# Patient Record
Sex: Male | Born: 1945 | Race: White | Hispanic: No | Marital: Married | Smoking: Current some day smoker
Health system: Southern US, Community
[De-identification: ages and names within clinical notes are randomized; demographics above are authoritative.]

## PROBLEM LIST (undated history)

## (undated) DIAGNOSIS — I10 Essential (primary) hypertension: Secondary | ICD-10-CM

## (undated) DIAGNOSIS — Z95810 Presence of automatic (implantable) cardiac defibrillator: Secondary | ICD-10-CM

## (undated) DIAGNOSIS — E119 Type 2 diabetes mellitus without complications: Secondary | ICD-10-CM

## (undated) DIAGNOSIS — I219 Acute myocardial infarction, unspecified: Secondary | ICD-10-CM

## (undated) HISTORY — PX: OTHER SURGICAL HISTORY: SHX169

## (undated) HISTORY — PX: CHOLECYSTECTOMY: SHX55

## (undated) HISTORY — PX: CARDIAC CATHETERIZATION: SHX172

## (undated) HISTORY — PX: CORONARY ANGIOPLASTY: SHX604

---

## 2003-01-21 DIAGNOSIS — Z944 Liver transplant status: Secondary | ICD-10-CM | POA: Insufficient documentation

## 2004-07-11 ENCOUNTER — Emergency Department: Payer: Self-pay | Admitting: Emergency Medicine

## 2005-08-18 ENCOUNTER — Emergency Department: Payer: Self-pay | Admitting: Emergency Medicine

## 2010-09-11 DIAGNOSIS — C44320 Squamous cell carcinoma of skin of unspecified parts of face: Secondary | ICD-10-CM | POA: Insufficient documentation

## 2010-12-27 DIAGNOSIS — I2510 Atherosclerotic heart disease of native coronary artery without angina pectoris: Secondary | ICD-10-CM | POA: Insufficient documentation

## 2010-12-27 DIAGNOSIS — E782 Mixed hyperlipidemia: Secondary | ICD-10-CM | POA: Insufficient documentation

## 2010-12-27 DIAGNOSIS — I10 Essential (primary) hypertension: Secondary | ICD-10-CM | POA: Insufficient documentation

## 2010-12-27 DIAGNOSIS — E119 Type 2 diabetes mellitus without complications: Secondary | ICD-10-CM | POA: Insufficient documentation

## 2021-03-23 ENCOUNTER — Other Ambulatory Visit: Payer: Self-pay

## 2021-03-23 ENCOUNTER — Emergency Department: Payer: Medicare Other

## 2021-03-23 ENCOUNTER — Observation Stay: Payer: Medicare Other

## 2021-03-23 ENCOUNTER — Observation Stay
Admission: EM | Admit: 2021-03-23 | Discharge: 2021-03-24 | Disposition: A | Discharge status: Home / Self Care | Payer: Medicare Other | Attending: Emergency Medicine | Admitting: Emergency Medicine

## 2021-03-23 ENCOUNTER — Encounter: Payer: Self-pay | Admitting: Intensive Care

## 2021-03-23 DIAGNOSIS — Z20822 Contact with and (suspected) exposure to covid-19: Secondary | ICD-10-CM | POA: Diagnosis not present

## 2021-03-23 DIAGNOSIS — Z944 Liver transplant status: Secondary | ICD-10-CM

## 2021-03-23 DIAGNOSIS — R4701 Aphasia: Secondary | ICD-10-CM

## 2021-03-23 DIAGNOSIS — I2510 Atherosclerotic heart disease of native coronary artery without angina pectoris: Secondary | ICD-10-CM | POA: Diagnosis present

## 2021-03-23 DIAGNOSIS — Z79899 Other long term (current) drug therapy: Secondary | ICD-10-CM | POA: Insufficient documentation

## 2021-03-23 DIAGNOSIS — G459 Transient cerebral ischemic attack, unspecified: Secondary | ICD-10-CM | POA: Diagnosis not present

## 2021-03-23 DIAGNOSIS — Z7982 Long term (current) use of aspirin: Secondary | ICD-10-CM | POA: Diagnosis not present

## 2021-03-23 DIAGNOSIS — Y900 Blood alcohol level of less than 20 mg/100 ml: Secondary | ICD-10-CM | POA: Diagnosis not present

## 2021-03-23 DIAGNOSIS — Z7984 Long term (current) use of oral hypoglycemic drugs: Secondary | ICD-10-CM | POA: Insufficient documentation

## 2021-03-23 DIAGNOSIS — F17290 Nicotine dependence, other tobacco product, uncomplicated: Secondary | ICD-10-CM | POA: Insufficient documentation

## 2021-03-23 DIAGNOSIS — E119 Type 2 diabetes mellitus without complications: Secondary | ICD-10-CM | POA: Diagnosis not present

## 2021-03-23 DIAGNOSIS — I10 Essential (primary) hypertension: Secondary | ICD-10-CM | POA: Diagnosis present

## 2021-03-23 DIAGNOSIS — E785 Hyperlipidemia, unspecified: Secondary | ICD-10-CM | POA: Diagnosis present

## 2021-03-23 HISTORY — DX: Type 2 diabetes mellitus without complications: E11.9

## 2021-03-23 HISTORY — DX: Essential (primary) hypertension: I10

## 2021-03-23 LAB — COMPREHENSIVE METABOLIC PANEL
ALT: 13 U/L (ref 0–44)
AST: 20 U/L (ref 15–41)
Albumin: 3.8 g/dL (ref 3.5–5.0)
Alkaline Phosphatase: 64 U/L (ref 38–126)
Anion gap: 7 (ref 5–15)
BUN: 17 mg/dL (ref 8–23)
CO2: 27 mmol/L (ref 22–32)
Calcium: 9.4 mg/dL (ref 8.9–10.3)
Chloride: 102 mmol/L (ref 98–111)
Creatinine, Ser: 1.1 mg/dL (ref 0.61–1.24)
GFR, Estimated: >60 mL/min (ref >60)
Glucose, Bld: 194 mg/dL — ABNORMAL HIGH (ref 70–99)
Potassium: 4.2 mmol/L (ref 3.5–5.1)
Sodium: 136 mmol/L (ref 135–145)
Total Bilirubin: 0.5 mg/dL (ref 0.3–1.2)
Total Protein: 7.8 g/dL (ref 6.5–8.1)

## 2021-03-23 LAB — PROTIME-INR
INR: 1 (ref 0.8–1.2)
Prothrombin Time: 12.6 seconds (ref 11.4–15.2)

## 2021-03-23 LAB — CBC
HCT: 41.2 % (ref 39.0–52.0)
Hemoglobin: 12.8 g/dL — ABNORMAL LOW (ref 13.0–17.0)
MCH: 23.8 pg — ABNORMAL LOW (ref 26.0–34.0)
MCHC: 31.1 g/dL (ref 30.0–36.0)
MCV: 76.7 fL — ABNORMAL LOW (ref 80.0–100.0)
Platelets: 273 10*3/uL (ref 150–400)
RBC: 5.37 MIL/uL (ref 4.22–5.81)
RDW: 19.9 % — ABNORMAL HIGH (ref 11.5–15.5)
WBC: 11.1 10*3/uL — ABNORMAL HIGH (ref 4.0–10.5)
nRBC: 0 % (ref 0.0–0.2)

## 2021-03-23 LAB — DIFFERENTIAL
Abs Immature Granulocytes: 0.04 10*3/uL (ref 0.00–0.07)
Basophils Absolute: 0.1 10*3/uL (ref 0.0–0.1)
Basophils Relative: 0 %
Eosinophils Absolute: 0.2 10*3/uL (ref 0.0–0.5)
Eosinophils Relative: 2 %
Immature Granulocytes: 0 %
Lymphocytes Relative: 20 %
Lymphs Abs: 2.3 10*3/uL (ref 0.7–4.0)
Monocytes Absolute: 0.7 10*3/uL (ref 0.1–1.0)
Monocytes Relative: 7 %
Neutro Abs: 7.8 10*3/uL — ABNORMAL HIGH (ref 1.7–7.7)
Neutrophils Relative %: 71 %

## 2021-03-23 LAB — URINE DRUG SCREEN, QUALITATIVE (ARMC ONLY)
Amphetamines, Ur Screen: NOT DETECTED
Barbiturates, Ur Screen: NOT DETECTED
Benzodiazepine, Ur Scrn: NOT DETECTED
Cannabinoid 50 Ng, Ur ~~LOC~~: NOT DETECTED
Cocaine Metabolite,Ur ~~LOC~~: NOT DETECTED
MDMA (Ecstasy)Ur Screen: NOT DETECTED
Methadone Scn, Ur: NOT DETECTED
Opiate, Ur Screen: NOT DETECTED
Phencyclidine (PCP) Ur S: NOT DETECTED
Tricyclic, Ur Screen: NOT DETECTED

## 2021-03-23 LAB — APTT: aPTT: 29 seconds (ref 24–36)

## 2021-03-23 LAB — ETHANOL: Alcohol, Ethyl (B): <10 mg/dL (ref <10)

## 2021-03-23 LAB — CBG MONITORING, ED: Glucose-Capillary: 203 mg/dL — ABNORMAL HIGH (ref 70–99)

## 2021-03-23 MED ORDER — CALCIUM 600+D3 PLUS MINERALS 600-800 MG-UNIT PO TABS: 1.0000 | ORAL_TABLET | Freq: Three times a day (TID) | ORAL | Status: DC

## 2021-03-23 MED ORDER — CLOPIDOGREL BISULFATE 75 MG PO TABS
75.0000 mg | ORAL_TABLET | Freq: Every day | ORAL | Status: DC
  Administered 2021-03-24: 75 mg via ORAL
  Filled 2021-03-23: qty 1

## 2021-03-23 MED ORDER — PRAVASTATIN SODIUM 20 MG PO TABS
40.0000 mg | ORAL_TABLET | Freq: Every day | ORAL | Status: DC
  Administered 2021-03-23: 22:00:00 40 mg via ORAL
  Filled 2021-03-23: qty 2

## 2021-03-23 MED ORDER — ASPIRIN EC 81 MG PO TBEC
81.0000 mg | DELAYED_RELEASE_TABLET | Freq: Every day | ORAL | Status: DC
  Administered 2021-03-23: 22:00:00 81 mg via ORAL
  Filled 2021-03-23: qty 1

## 2021-03-23 MED ORDER — LORAZEPAM 2 MG/ML IJ SOLN
0.2500 mg | Freq: Once | INTRAMUSCULAR | Status: AC
  Administered 2021-03-23: 21:00:00 0.25 mg via INTRAVENOUS
  Filled 2021-03-23: qty 1

## 2021-03-23 MED ORDER — PANTOPRAZOLE SODIUM 40 MG PO TBEC
40.0000 mg | DELAYED_RELEASE_TABLET | Freq: Every day | ORAL | Status: DC
  Administered 2021-03-23: 22:00:00 40 mg via ORAL
  Filled 2021-03-23: qty 1

## 2021-03-23 MED ORDER — INSULIN ASPART 100 UNIT/ML IJ SOLN
0.0000 [IU] | Freq: Three times a day (TID) | INTRAMUSCULAR | Status: DC
  Filled 2021-03-23: qty 1

## 2021-03-23 MED ORDER — ENOXAPARIN SODIUM 40 MG/0.4ML IJ SOSY
40.0000 mg | PREFILLED_SYRINGE | INTRAMUSCULAR | Status: DC
  Administered 2021-03-23: 22:00:00 40 mg via SUBCUTANEOUS
  Filled 2021-03-23: qty 0.4

## 2021-03-23 MED ORDER — IOHEXOL 350 MG/ML SOLN
75.0000 mL | Freq: Once | INTRAVENOUS | Status: AC | PRN
  Administered 2021-03-23: 19:00:00 75 mL via INTRAVENOUS

## 2021-03-23 MED ORDER — ACETAMINOPHEN 160 MG/5ML PO SOLN
650.0000 mg | ORAL | Status: DC | PRN
  Filled 2021-03-23: qty 20.3

## 2021-03-23 MED ORDER — SODIUM CHLORIDE 0.9 % IV SOLN: INTRAVENOUS | Status: DC

## 2021-03-23 MED ORDER — TACROLIMUS 0.5 MG PO CAPS
0.5000 mg | ORAL_CAPSULE | Freq: Two times a day (BID) | ORAL | Status: DC
  Administered 2021-03-23 – 2021-03-24 (×2): 0.5 mg via ORAL
  Filled 2021-03-23 (×4): qty 1

## 2021-03-23 MED ORDER — STROKE: EARLY STAGES OF RECOVERY BOOK: Freq: Once | Status: AC

## 2021-03-23 MED ORDER — HYDRALAZINE HCL 20 MG/ML IJ SOLN: 10.0000 mg | INTRAMUSCULAR | Status: DC | PRN

## 2021-03-23 MED ORDER — ACETAMINOPHEN 325 MG PO TABS: 650.0000 mg | ORAL_TABLET | ORAL | Status: DC | PRN

## 2021-03-23 MED ORDER — ACETAMINOPHEN 650 MG RE SUPP: 650.0000 mg | RECTAL | Status: DC | PRN

## 2021-03-23 MED ORDER — OYSTER SHELL CALCIUM/D3 500-5 MG-MCG PO TABS
1.0000 | ORAL_TABLET | Freq: Three times a day (TID) | ORAL | Status: DC
  Administered 2021-03-24: 1 via ORAL
  Filled 2021-03-23: qty 1

## 2021-03-23 NOTE — Consult Note (Signed)
CODE STROKE- PHARMACY COMMUNICATION   Time CODE STROKE called/page received:1816  Time response to CODE STROKE was made (via phone): 1825  Time Stroke Kit retrieved from [facility] (only if needed): N/A. 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 Pt's symptoms have completely resolved to baseline during neuro assessment. Will be admitted for further stroke work-up and medical mgmt, but not receiving tPA at this time.  Name of Provider/Nurse contacted: Contacted RN Allred, and Neuro MD was Dr. Rory Percy.  Past Medical History:  Diagnosis Date   Diabetes mellitus without complication ([place])    Hypertension    Prior to Admission medications   Not on File    Lorna Dibble ,PharmD Clinical Pharmacist  03/23/2021  6:24 PM

## 2021-03-23 NOTE — ED Provider Notes (Signed)
Emergency Medicine Provider Triage Evaluation Note  Dean King , a 75 y.o. male  was evaluated in triage.  Pt complains of loss of speech that started  around 5:45pm. Wife heard him trying to say something and she went to check on him and he couldn't say anything. Speech issue has resolved, however wife states he is still altered.   Review of Systems  Positive: Altered mental status Negative: Headache  Physical Exam  There were no vitals taken for this visit. Gen:   Awake, no distress   Resp:  Normal effort  MSK:   Moves extremities without difficulty  Other:    Medical Decision Making  Medically screening exam initiated at 6:10 PM.  Appropriate orders placed.  Randolm Idol was informed that the remainder of the evaluation will be completed by another provider, this initial triage assessment does not replace that evaluation, and the importance of remaining in the ED until their evaluation is complete.  Patient directly to CT then assigned to room 11.   Victorino Dike, FNP 03/23/21 1814    Harvest Dark, MD 03/24/21 [phone]

## 2021-03-23 NOTE — ED Notes (Signed)
Called [facility] initiated code stroke  1816

## 2021-03-23 NOTE — H&P (Signed)
History and Physical    Dean King IOE:[id number] DOB: 1945/05/28 DOA: 03/23/2021  PCP: Albina Billet, MD  Patient coming from: Home.  Chief Complaint: Difficulty speaking.  HPI: Dean King is a 75 y.o. male with history of liver transplant, CAD status post stenting, hypertension, diabetes mellitus, tobacco use, anemia was brought to the ER after patient started experience difficulty talking.  Patient states he was able to understand but was unable to talk about the words.  This happened around 5:45 PM on March 23, 2021 while watching television with his wife.  By time patient reached the ER patient's symptoms are largely resolved.  Denies any weakness of the upper or lower extremities.  Denies any visual symptoms.  ED Course: CT head shows possible subacute infarct along the right side of the brain.  EKG shows normal sinus rhythm.  On-call neurology was consulted.  Patient admitted for further work-up of TIA versus stroke.  CT angiogram of the head and neck shows 70% luminal's stenosis of the proximal left internal carotid artery.  Patient passed stroke swallow.  COVID test was negative.  Review of Systems: As per HPI, rest all negative.   Past Medical History:  Diagnosis Date   Diabetes mellitus without complication ([place])    Hypertension     Past Surgical History:  Procedure Laterality Date   CARDIAC CATHETERIZATION     CHOLECYSTECTOMY     CORONARY ANGIOPLASTY     liver transplannt       reports that he has been smoking cigars. His smokeless tobacco use includes chew. He reports that he does not drink alcohol and does not use drugs.  Allergies  Allergen Reactions   Levaquin [Levofloxacin]     Family History  Family history unknown: Yes    Prior to Admission medications   Medication Sig Start Date End Date Taking? Authorizing Provider  amLODipine (NORVASC) 10 MG tablet Take 10 mg by mouth every evening.   Yes [provider]  aspirin EC 81 MG tablet  Take 81 mg by mouth at bedtime.   Yes [provider]  Calcium Carbonate-Vit D-Min (CALCIUM 600+D3 PLUS MINERALS) 600-800 MG-UNIT TABS Take 1 tablet by mouth 3 (three) times daily.   Yes [provider]  clopidogrel (PLAVIX) 75 MG tablet Take 75 mg by mouth daily.   Yes [provider]  glipiZIDE (GLUCOTROL) 5 MG tablet Take 5 mg by mouth 2 (two) times daily before a meal.   Yes [provider]  magnesium oxide (MAG-OX) 400 MG tablet Take 400 mg by mouth 2 (two) times daily.   Yes [provider]  metFORMIN (GLUCOPHAGE) 500 MG tablet Take 1,000 mg by mouth 2 (two) times daily.   Yes [provider]  metoprolol tartrate (LOPRESSOR) 100 MG tablet Take 100 mg by mouth 2 (two) times daily.   Yes [provider]  omeprazole (PRILOSEC) 20 MG capsule Take 20 mg by mouth at bedtime.   Yes [provider]  pravastatin (PRAVACHOL) 40 MG tablet Take 40 mg by mouth at bedtime.   Yes [provider]  PROGRAF 0.5 MG capsule Take 0.5 mg by mouth 2 (two) times daily.   Yes [provider]    Physical Exam: Constitutional: Moderately built and nourished. Vitals:   03/23/21 1812 03/23/21 1815 03/23/21 1900  BP: (!) 144/61    Pulse: 87  88  Resp: 18  16  Temp: 98 F (36.7 C)    TempSrc: Oral  SpO2: 96%  99%  Weight:  78.9 kg   Height:  5\' 10"  (1.778 m)    Eyes: Anicteric no pallor. ENMT: No discharge from the ears eyes nose and mouth. Neck: No mass felt.  No neck rigidity. Respiratory: No rhonchi or crepitations. Cardiovascular: S1-S2 heard. Abdomen: Soft nontender bowel sound present. Musculoskeletal: No edema. Skin: No rash. Neurologic: Alert awake oriented to time place and person.  Moves all extremities 5 x 5.  No facial asymmetry tongue is midline pupils equal and reacting to light. Psychiatric: Appears normal.  Normal affect.   Labs on Admission: I have personally reviewed following labs and imaging  studies  CBC: Recent Labs  Lab 03/23/21 1819  WBC 11.1*  NEUTROABS 7.8*  HGB 12.8*  HCT 41.2  MCV 76.7*  PLT 330   Basic Metabolic Panel: Recent Labs  Lab 03/23/21 1819  NA 136  K 4.2  CL 102  CO2 27  GLUCOSE 194*  BUN 17  CREATININE 1.10  CALCIUM 9.4   GFR: Estimated Creatinine Clearance: 59.9 mL/min (by C-G formula based on SCr of 1.1 mg/dL). Liver Function Tests: Recent Labs  Lab 03/23/21 1819  AST 20  ALT 13  ALKPHOS 64  BILITOT 0.5  PROT 7.8  ALBUMIN 3.8   No results for input(s): LIPASE, AMYLASE in the last 168 hours. No results for input(s): AMMONIA in the last 168 hours. Coagulation Profile: Recent Labs  Lab 03/23/21 1819  INR 1.0   Cardiac Enzymes: No results for input(s): CKTOTAL, CKMB, CKMBINDEX, TROPONINI in the last 168 hours. BNP (last 3 results) No results for input(s): PROBNP in the last 8760 hours. HbA1C: No results for input(s): HGBA1C in the last 72 hours. CBG: Recent Labs  Lab 03/23/21 1815  GLUCAP 203*   Lipid Profile: No results for input(s): CHOL, HDL, LDLCALC, TRIG, CHOLHDL, LDLDIRECT in the last 72 hours. Thyroid Function Tests: No results for input(s): TSH, T4TOTAL, FREET4, T3FREE, THYROIDAB in the last 72 hours. Anemia Panel: No results for input(s): VITAMINB12, FOLATE, FERRITIN, TIBC, IRON, RETICCTPCT in the last 72 hours. Urine analysis: No results found for: COLORURINE, APPEARANCEUR, LABSPEC, PHURINE, GLUCOSEU, HGBUR, BILIRUBINUR, KETONESUR, PROTEINUR, UROBILINOGEN, NITRITE, LEUKOCYTESUR Sepsis Labs: @LABRCNTIP (procalcitonin:4,lacticidven:4) )No results found for this or any previous visit (from the past 240 hour(s)).   Radiological Exams on Admission: CT HEAD CODE STROKE WO CONTRAST  Addendum Date: 03/23/2021   ADDENDUM REPORT: 03/23/2021 18:51 ADDENDUM: The area infarction is in the right temporal and parietal lobe, not left. Findings were discussed with Dr. Rory Percy. Electronically Signed   By: San Morelle M.D.   On: 03/23/2021 18:51   Result Date: 03/23/2021 CLINICAL DATA:  Code stroke. Episode of abnormal speech approximately 45 minutes ago. Speech symptoms have resolved. Patient's wife states altered mental status persist. EXAM: CT HEAD WITHOUT CONTRAST TECHNIQUE: Contiguous axial images were obtained from the base of the skull through the vertex without intravenous contrast. COMPARISON:  None. FINDINGS: Brain: A left temporal and parietal lobe infarct extends over 6 cm involvement of the superior temporal gyrus and right occipital lobe. No acute hemorrhage is present. Sulci are effaced. Adjacent ventricle within normal limits without evidence for volume loss. Mild generalized white matter disease is present otherwise. Basal ganglia are otherwise intact. The insular ribbon is normal bilaterally. No significant extraaxial fluid collection is present. The craniocervical junction is normal. Upper cervical spine is within normal limits. Marrow signal is unremarkable. Vascular: Atherosclerotic calcifications are present within the cavernous internal carotid arteries. No hyperdense vessel  is present. Skull: Calvarium is intact. No focal lytic or blastic lesions are present. No significant extracranial soft tissue lesion is present. Sinuses/Orbits: Mild mucosal thickening is present within the inferior frontal sinuses bilaterally. The paranasal sinuses and mastoid air cells are otherwise clear. Bilateral lens replacements are noted. Globes and orbits are otherwise unremarkable. Other: ASPECTS ([place] Stroke Program Early CT Score) - Ganglionic level infarction (caudate, lentiform nuclei, internal capsule, insula, M1-M3 cortex): 6/7 - Supraganglionic infarction (M4-M6 cortex): 2/3 Total score (0-10 with 10 being normal): 8/10 IMPRESSION: 1. 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 Infarct appears acute/subacute, likely greater  than 1 hour old. Recommend CTA head and neck with contrast and MR head without contrast for further evaluation. 2. No acute hemorrhage. 3. ASPECTS is 8/10. These results were called by telephone at the time of interpretation on 03/23/2021 at 6:32 pm to provider [facility] , who verbally acknowledged these results. Electronically Signed: By: San Morelle M.D. On: 03/23/2021 18:32   CT ANGIO HEAD NECK W WO CM (CODE STROKE)  Result Date: 03/23/2021 CLINICAL DATA:  Word finding difficulty EXAM: CT ANGIOGRAPHY HEAD AND NECK TECHNIQUE: Multidetector CT imaging of the head and neck was performed using the standard protocol during bolus administration of intravenous contrast. Multiplanar CT image reconstructions and MIPs were obtained to evaluate the vascular anatomy. Carotid stenosis measurements (when applicable) are obtained utilizing NASCET criteria, using the distal internal carotid diameter as the denominator. CONTRAST:  6mL OMNIPAQUE IOHEXOL 350 MG/ML SOLN COMPARISON:  No prior CTA. Correlation is made with 03/23/2021 CT head. FINDINGS: CT HEAD FINDINGS For noncontrast findings, please see same day CT head. CTA NECK FINDINGS Aortic arch: Standard branching. Imaged portion shows no evidence of aneurysm or dissection. No significant stenosis of the major arch vessel origins, although calcified and noncalcified plaque does extend into the origins. Right carotid system: Calcified and noncalcified plaque at the bifurcation, which causes less than 50% luminal narrowing. No evidence of dissection, stenosis (50% or greater) or occlusion. Left carotid system: Calcified and noncalcified plaque at the bifurcation and in the proximal left ICA causes 70% luminal narrowing in the proximal left ICA. In addition, there is approximately 30% narrowing of the proximal left CCA. No evidence of dissection or occlusion. Vertebral arteries: Right dominant. No evidence of dissection, stenosis (50% or greater) or occlusion.  Skeleton: No acute osseous abnormality. Other neck: Prominent left parotid (series 8, image 162) and left level 2A (series 8, image 209) lymph nodes, which each measure up to 6 mm in short axis. These are not of abnormal density or morphology and are favored to be reactive. Upper chest: Negative. Review of the MIP images confirms the above findings CTA HEAD FINDINGS Anterior circulation: Calcified and noncalcified plaque in the intracranial internal carotid arteries, left-greater-than-right, which causes moderate luminal narrowing in the left cavernous segment and moderate narrowing in the right supraclinoid segment. Patent right A1. Hypoplastic left A1. Normal anterior communicating artery. Anterior cerebral arteries are patent to their distal aspects. No M1 stenosis or occlusion. Normal MCA bifurcations. Distal MCA branches perfused and symmetric. Posterior circulation: Vertebral arteries patent to the vertebrobasilar junction with minimal calcification but without significant stenosis. Right dominant. Posterior inferior cerebral arteries patent bilaterally. Basilar patent to its distal aspect. Superior cerebellar arteries patent bilaterally. Bilateral P1 segments originate from the basilar artery. Duplicated left PCA with fetal origin from a patent left posterior communicating artery. PCAs perfused to  their distal aspects without stenosis. Venous sinuses: Patent. Anatomic variants: Duplicated left PCA, with additional fetal origin PCA Review of the MIP images confirms the above findings IMPRESSION: 1. 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 No other significant intracranial stenosis. 2. 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 In addition there is approximately 30% narrowing of the proximal left CCA. No other hemodynamically significant stenosis in the neck. Electronically Signed   By: Merilyn Baba M.D.   On: 03/23/2021 19:26    EKG: Independently reviewed.  Normal sinus  rhythm.  Assessment/Plan Principal Problem:   TIA (transient ischemic attack) Active Problems:   CAD (coronary artery disease)   Essential hypertension   HLD (hyperlipidemia)   Liver transplant recipient [facility])   Diabetes mellitus type 2 in nonobese [facility])    TIA versus stroke -appreciate neurology consult.  CT angiogram of the head and neck does show 70% luminal needs narrowing of the proximal left ICA.  MRI brain is pending.  We will follow further recommendations per neurology.  Patient is presently on antiplatelet agents statins.  Neurochecks.  Hemoglobin A1c lipid panel and 2D echo.  Physical therapy consult. Hypertension -as needed IV hydralazine for systolic more than 626 and diastolic more than 948.  Allow for permissive hypertension as requested by neurologist. Diabetes mellitus type 2 we will keep patient on sliding scale coverage.  Hemoglobin A1c is pending. 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 Anemia appears to be chronic follow CBC check anemia panel with next blood draw. CAD status post tenting denies any chest pain.   DVT prophylaxis: Lovenox. Code Status: Full code. Family Communication: Discussed with patient's wife. Disposition Plan: Home. Consults called: Neurology. Admission status: Observation.   Rise Patience MD [facility] Pager [phone].  If 7PM-7AM, please contact night-coverage [email or web address] Password [email or web address]  03/23/2021, 7:40 PM

## 2021-03-23 NOTE — Consult Note (Addendum)
[facility] Telemedicine Consult   Requesting Provider: Dr. Charlsie Quest Consult Participants: Dr. Jerelyn Charles, Telespecialist RN Verdis Frederickson   bedside RN Claiborne Billings  Location of the provider: Home Location of the patient: [facility] CT scanner in bed 11  This consult was provided via telemedicine with 2-way video and audio communication. The patient/family was informed that care would be provided in this way and agreed to receive care in this manner.    Chief Complaint: Word finding difficulty and dysarthria  HPI: 75 year old with past medical history of diabetes, CAD, liver transplant in 2004, hypertension, functional status good with to 3 miles walk daily and playing golf, presenting to the emergency room with sudden onset of what the right described as initially confusion but also word finding difficulty and dysarthria. Around 5:45 PM while watching a game, all of a sudden the wife noticed that he was not responding appropriately to her questions.  He said he remembers that his wife kept asking him things and he could not bring his words out.  His wife asked him to say her name and he knew that her name was Bes  but could not say it.  He himself brought to the hospital and appeared confused to the wife but then on the way her symptoms started to improve some, he started to become more verbal and started to fuss while driving which she usually does and he was evaluated in the emergency room.  And triage, the wife still reported persistent symptoms for which a code stroke was activated.  Upon the time that I evaluated him, NIH is 0. CT head shows a moderate to large area of encephalomalacia in the right MCA territory vs late subacute infarct.   Patient denies any prior history of stroke or trauma to the head.  Robust review of systems performed-ROS essentially negative-neg for preceding illness or sickness.  No strokelike symptoms recently or remotely.  Wife also denies any history of stroke or head trauma.   No chest pain shortness of breath.  No nausea vomiting.  No fever chills.   Past Medical History:  Diagnosis Date   Diabetes mellitus without complication ([place])    Hypertension   Coronary artery disease Liver transplant 2004 Multiple skin cancers  No current facility-administered medications for this encounter. No current outpatient medications on file.  LKW: 9211 tpa given?: No, moderate to large right hemispheric hypodensity and NIHSS 0 IR Thrombectomy? No, no E LVO, NIH 0 Modified Rankin Scale: 0-Completely asymptomatic and back to baseline post- stroke Time of teleneurologist evaluation: 1820  Exam: Vitals:   03/23/21 1812  BP: (!) 144/61  Pulse: 87  Resp: 18  Temp: 98 F (36.7 C)  SpO2: 96%    General: Awake alert in no distress Atypical normocephalic/atraumatic CVS: Regular rate rhythm on the monitor Neurological exam Awake alert oriented x3 Was able to give me a detailed history of what happened. No aphasia No dysarthria Was able to tell me the current president and 3 presidents prior as well as the president but was from [place] chart and [place]. Cranial nerves II to XII: Pupils equal round reactive to light, extraocular movements intact, visual field testing difficult but does not look to have field deficits, facial sensation intact, face symmetric. Motor examination with no drift in any of the 4 extremities. Sensation intact light touch without extinction Coordination with no dysmetria NIH- Stroke scale-0  Imaging Reviewed:  CTH with right MCA territory subacute to chronic appearing infarct. No bleed. No evolving  stroke on the left hemisphere. CTA head neck with left carotid stenosis at the bifurcation  No ELVO   Labs reviewed in epic and pertinent values follow: CBC    Component Value Date/Time   WBC 11.1 (H) 03/23/2021 1819   RBC 5.37 03/23/2021 1819   HGB 12.8 (L) 03/23/2021 1819   HCT 41.2 03/23/2021 1819   PLT 273 03/23/2021 1819    MCV 76.7 (L) 03/23/2021 1819   MCH 23.8 (L) 03/23/2021 1819   MCHC 31.1 03/23/2021 1819   RDW 19.9 (H) 03/23/2021 1819   LYMPHSABS 2.3 03/23/2021 1819   MONOABS 0.7 03/23/2021 1819   EOSABS 0.2 03/23/2021 1819   BASOSABS 0.1 03/23/2021 1819   CMP     Component Value Date/Time   NA 136 03/23/2021 1819   K 4.2 03/23/2021 1819   CL 102 03/23/2021 1819   CO2 27 03/23/2021 1819   GLUCOSE 194 (H) 03/23/2021 1819   BUN 17 03/23/2021 1819   CREATININE 1.10 03/23/2021 1819   CALCIUM 9.4 03/23/2021 1819   PROT 7.8 03/23/2021 1819   ALBUMIN 3.8 03/23/2021 1819   AST 20 03/23/2021 1819   ALT 13 03/23/2021 1819   ALKPHOS 64 03/23/2021 1819   BILITOT 0.5 03/23/2021 1819   GFRNONAA >60 03/23/2021 1819     Assessment: 75 year old with dental 15 minutes worth of word finding difficulty brought in for emergent evaluation for stroke.  On my examination, symptoms completely resolved.  NIH stroke scale 0. CT head with a right parietal temporal occipital area of what looks like to me late subacute to chronic infarct but could be late acute to early subacute infarct as well. Symptoms do not match the imaging findings. 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  The right MCA stroke might be subacute to chronic as well. Given his complex medical history including that of transplant, coronary artery disease, diabetes and hypertension- I would recommend admission for stroke work-up.  Not a candidate for IV TNKase due to NIH of 0 Not a candidate for thrombectomy due to NIH 0, no emergent LVO.   Recommendations:  Admit to hospitalist Frequent neurochecks Telemetry Aspirin 325 Atorvastatin 80 MRI brain without contrast 2d echo A1c Lipid panel PT OT Speech therapy Allow for permissive hypertension-treat only if systolic blood pressures are greater than 220 on a as needed basis.  48 to 72 hours later,  start normalizing blood pressure with a eventual blood pressure goal of normotension.  Discussed the plan in detail with the patient and ED provider Dr. Su Hoff at bedside   This patient is receiving care for possible acute neurological changes. There was 56 minutes of care by this provider at the time of service, including time for direct evaluation via telemedicine, review of medical records, imaging studies and discussion of findings with providers, the patient and/or family.  -- Amie Portland, MD [facility] Pager: [phone] If 7pm to 7am, please call on call as listed on AMION.

## 2021-03-23 NOTE — ED Notes (Signed)
Wife stating they were watching a basketball game about 5:45pm when she looked over and her husband wasn't responding. Wife states pt had episode of aphasia.

## 2021-03-23 NOTE — ED Triage Notes (Addendum)
Wife reports patient presented with aphasia for 10 minutes then resolved. Wife reports "he just seemed altered for awhile after and having a hard time finding his words." Symptoms started around 5:45pm.

## 2021-03-23 NOTE — ED Provider Notes (Signed)
[facility] Emergency Department Provider Note ____________________________________________   Event Date/Time   First MD Initiated Contact with Patient 03/23/21 1817     (approximate)  I have reviewed the triage vital signs and the nursing notes.  HISTORY  Chief Complaint Aphasia and Altered Mental Status   HPI Dean King is a 75 y.o. malewho presents to the ED for evaluation of aphasia.  Chart review indicates DM and HTN.  CAD and skin cancers. 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  Wife provides me with a good history while patient is in CT scan.  She was with the patient this afternoon and at about 545p while they were watching a basketball game, patient had sudden onset aphasia and dysarthria.  She reports that he is quite talkative at baseline and she knew something was wrong by the noises he was making.  He could not get the words out and was stumbling over his speech and dysarthric.  He could not say his wife's name or his own name, so she quickly drove him to the hospital.  She reports that by the time they were nearly to the hospital, his speech is improving as he was telling her to slow down.  No recent falls, illnesses or injuries.  This has never happened before.  Past Medical History:  Diagnosis Date   Diabetes mellitus without complication ([place])    Hypertension     Patient Active Problem List   Diagnosis Date Noted   TIA (transient ischemic attack) 03/23/2021   CAD (coronary artery disease) 03/23/2021   Essential hypertension 03/23/2021   HLD (hyperlipidemia) 03/23/2021   Liver transplant recipient [facility]) 03/23/2021   Diabetes mellitus type 2 in nonobese ([place]) 03/23/2021    Past Surgical History:  Procedure Laterality Date   CARDIAC CATHETERIZATION     CHOLECYSTECTOMY     CORONARY ANGIOPLASTY     liver transplannt      Prior to Admission medications   Medication Sig Start  Date End Date Taking? Authorizing Provider  amLODipine (NORVASC) 10 MG tablet Take 10 mg by mouth every evening.   Yes [provider]  aspirin EC 81 MG tablet Take 81 mg by mouth at bedtime.   Yes [provider]  Calcium Carbonate-Vit D-Min (CALCIUM 600+D3 PLUS MINERALS) 600-800 MG-UNIT TABS Take 1 tablet by mouth 3 (three) times daily.   Yes [provider]  clopidogrel (PLAVIX) 75 MG tablet Take 75 mg by mouth daily.   Yes [provider]  glipiZIDE (GLUCOTROL) 5 MG tablet Take 5 mg by mouth 2 (two) times daily before a meal.   Yes [provider]  magnesium oxide (MAG-OX) 400 MG tablet Take 400 mg by mouth 2 (two) times daily.   Yes [provider]  metFORMIN (GLUCOPHAGE) 500 MG tablet Take 1,000 mg by mouth 2 (two) times daily.   Yes [provider]  metoprolol tartrate (LOPRESSOR) 100 MG tablet Take 100 mg by mouth 2 (two) times daily.   Yes [provider]  omeprazole (PRILOSEC) 20 MG capsule Take 20 mg by mouth at bedtime.   Yes [provider]  pravastatin (PRAVACHOL) 40 MG tablet Take 40 mg by mouth at bedtime.   Yes [provider]  PROGRAF 0.5 MG capsule Take 0.5 mg by mouth 2 (two) times daily.   Yes [provider]    Allergies Levaquin [levofloxacin]  Family History  Family history unknown: Yes  Social History Social History   Tobacco Use   Smoking status: Some Days    Types: Cigars   Smokeless tobacco: Current    Types: Chew  Substance Use Topics   Alcohol use: Never   Drug use: Never    Review of Systems  Unable to be accurately obtained from the patient due to acuity of condition ____________________________________________   PHYSICAL EXAM:  VITAL SIGNS: Vitals:   03/23/21 1900 03/23/21 1930  BP:  (!) 114/48  Pulse: 88 79  Resp: 16 12  Temp:    SpO2: 99% 98%     Constitutional: Alert and oriented. Well appearing and in no acute distress. Eyes:  Conjunctivae are normal. PERRL. EOMI. Head: Atraumatic. Nose: No congestion/rhinnorhea. Mouth/Throat: Mucous membranes are moist.  Oropharynx non-erythematous. Neck: No stridor. No cervical spine tenderness to palpation. Cardiovascular: Normal rate, regular rhythm. Good peripheral circulation. Respiratory: Normal respiratory effort.  No retractions. Lungs CTAB. Gastrointestinal: Soft , nondistended, nontender to palpation.  Musculoskeletal: No joint effusions. No signs of acute trauma. Neurologic:  Normal speech and language. No gross focal neurologic deficits are appreciated. No gait instability noted. Skin:  Skin is warm, dry and intact. No rash noted. Psychiatric: Mood and affect are normal. Speech and behavior are normal. ____________________________________________   LABS (all labs ordered are listed, but only abnormal results are displayed)  Labs Reviewed  CBC - Abnormal; Notable for the following components:      Result Value   WBC 11.1 (*)    Hemoglobin 12.8 (*)    MCV 76.7 (*)    MCH 23.8 (*)    RDW 19.9 (*)    All other components within normal limits  DIFFERENTIAL - Abnormal; Notable for the following components:   Neutro Abs 7.8 (*)    All other components within normal limits  COMPREHENSIVE METABOLIC PANEL - Abnormal; Notable for the following components:   Glucose, Bld 194 (*)    All other components within normal limits  CBG MONITORING, ED - Abnormal; Notable for the following components:   Glucose-Capillary 203 (*)    All other components within normal limits  RESP PANEL BY RT-PCR (FLU A&B, COVID) ARPGX2  ETHANOL  PROTIME-INR  APTT  URINE DRUG SCREEN, QUALITATIVE ([facility] ONLY)  HEMOGLOBIN A1C  LIPID PANEL   ____________________________________________  12 Lead EKG   ____________________________________________  RADIOLOGY  ED MD interpretation:   CT head reviewed by me with large right temporoparietal infarction  Official radiology report(s): CT  HEAD CODE STROKE WO CONTRAST  Addendum Date: 03/23/2021   ADDENDUM REPORT: 03/23/2021 18:51 ADDENDUM: The area infarction is in the right temporal and parietal lobe, not left. Findings were discussed with Dr. Rory Percy. Electronically Signed   By: San Morelle M.D.   On: 03/23/2021 18:51   Result Date: 03/23/2021 CLINICAL DATA:  Code stroke. Episode of abnormal speech approximately 45 minutes ago. Speech symptoms have resolved. Patient's wife states altered mental status persist. EXAM: CT HEAD WITHOUT CONTRAST TECHNIQUE: Contiguous axial images were obtained from the base of the skull through the vertex without intravenous contrast. COMPARISON:  None. FINDINGS: Brain: A left temporal and parietal lobe infarct extends over 6 cm involvement of the superior temporal gyrus and right occipital lobe. No acute hemorrhage is present. Sulci are effaced. Adjacent ventricle within normal limits without evidence for volume loss. Mild generalized white matter disease is present otherwise. Basal ganglia are otherwise intact. The insular ribbon is normal bilaterally. No significant extraaxial fluid collection is present. The craniocervical junction is  normal. Upper cervical spine is within normal limits. Marrow signal is unremarkable. Vascular: Atherosclerotic calcifications are present within the cavernous internal carotid arteries. No hyperdense vessel is present. Skull: Calvarium is intact. No focal lytic or blastic lesions are present. No significant extracranial soft tissue lesion is present. Sinuses/Orbits: Mild mucosal thickening is present within the inferior frontal sinuses bilaterally. The paranasal sinuses and mastoid air cells are otherwise clear. Bilateral lens replacements are noted. Globes and orbits are otherwise unremarkable. Other: ASPECTS ([place] Stroke Program Early CT Score) - Ganglionic level infarction (caudate, lentiform nuclei, internal capsule, insula, M1-M3 cortex): 6/7 - Supraganglionic  infarction (M4-M6 cortex): 2/3 Total score (0-10 with 10 being normal): 8/10 IMPRESSION: 1. 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 Infarct appears acute/subacute, likely greater than 1 hour old. Recommend CTA head and neck with contrast and MR head without contrast for further evaluation. 2. No acute hemorrhage. 3. ASPECTS is 8/10. These results were called by telephone at the time of interpretation on 03/23/2021 at 6:32 pm to provider [facility] , who verbally acknowledged these results. Electronically Signed: By: San Morelle M.D. On: 03/23/2021 18:32   CT ANGIO HEAD NECK W WO CM (CODE STROKE)  Result Date: 03/23/2021 CLINICAL DATA:  Word finding difficulty EXAM: CT ANGIOGRAPHY HEAD AND NECK TECHNIQUE: Multidetector CT imaging of the head and neck was performed using the standard protocol during bolus administration of intravenous contrast. Multiplanar CT image reconstructions and MIPs were obtained to evaluate the vascular anatomy. Carotid stenosis measurements (when applicable) are obtained utilizing NASCET criteria, using the distal internal carotid diameter as the denominator. CONTRAST:  47mL OMNIPAQUE IOHEXOL 350 MG/ML SOLN COMPARISON:  No prior CTA. Correlation is made with 03/23/2021 CT head. FINDINGS: CT HEAD FINDINGS For noncontrast findings, please see same day CT head. CTA NECK FINDINGS Aortic arch: Standard branching. Imaged portion shows no evidence of aneurysm or dissection. No significant stenosis of the major arch vessel origins, although calcified and noncalcified plaque does extend into the origins. Right carotid system: Calcified and noncalcified plaque at the bifurcation, which causes less than 50% luminal narrowing. No evidence of dissection, stenosis (50% or greater) or occlusion. Left carotid system: Calcified and noncalcified plaque at the bifurcation and in the proximal left ICA causes 70%  luminal narrowing in the proximal left ICA. In addition, there is approximately 30% narrowing of the proximal left CCA. No evidence of dissection or occlusion. Vertebral arteries: Right dominant. No evidence of dissection, stenosis (50% or greater) or occlusion. Skeleton: No acute osseous abnormality. Other neck: Prominent left parotid (series 8, image 162) and left level 2A (series 8, image 209) lymph nodes, which each measure up to 6 mm in short axis. These are not of abnormal density or morphology and are favored to be reactive. Upper chest: Negative. Review of the MIP images confirms the above findings CTA HEAD FINDINGS Anterior circulation: Calcified and noncalcified plaque in the intracranial internal carotid arteries, left-greater-than-right, which causes moderate luminal narrowing in the left cavernous segment and moderate narrowing in the right supraclinoid segment. Patent right A1. Hypoplastic left A1. Normal anterior communicating artery. Anterior cerebral arteries are patent to their distal aspects. No M1 stenosis or occlusion. Normal MCA bifurcations. Distal MCA branches perfused and symmetric. Posterior circulation: Vertebral arteries patent to the vertebrobasilar junction with minimal calcification but without significant stenosis. Right dominant. Posterior inferior cerebral arteries patent bilaterally. Basilar patent to its distal aspect. Superior cerebellar arteries  patent bilaterally. Bilateral P1 segments originate from the basilar artery. Duplicated left PCA with fetal origin from a patent left posterior communicating artery. PCAs perfused to their distal aspects without stenosis. Venous sinuses: Patent. Anatomic variants: Duplicated left PCA, with additional fetal origin PCA Review of the MIP images confirms the above findings IMPRESSION: 1. 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 No other significant intracranial stenosis. 2. 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 In  addition there is approximately 30% narrowing of the proximal left CCA. No other hemodynamically significant stenosis in the neck. Electronically Signed   By: Merilyn Baba M.D.   On: 03/23/2021 19:26    ____________________________________________   PROCEDURES and INTERVENTIONS  Procedure(s) performed (including Critical Care):  .1-3 Lead EKG Interpretation Performed by: Vladimir Crofts, MD Authorized by: Vladimir Crofts, MD     Interpretation: normal     ECG rate:  80   ECG rate assessment: normal     Rhythm: sinus rhythm     Ectopy: none     Conduction: normal   .Critical Care Performed by: Vladimir Crofts, MD Authorized by: Vladimir Crofts, MD   Critical care provider statement:    Critical care time (minutes):  30   Critical care time was exclusive of:  Separately billable procedures and treating other patients   Critical care was necessary to treat or prevent imminent or life-threatening deterioration of the following conditions:  CNS failure or compromise   Critical care was time spent personally by me on the following activities:  Development of treatment plan with patient or surrogate, discussions with consultants, evaluation of patient's response to treatment, examination of patient, ordering and review of laboratory studies, ordering and review of radiographic studies, ordering and performing treatments and interventions, pulse oximetry, re-evaluation of patient's condition and review of old charts  Medications  aspirin EC tablet 81 mg (has no administration in time range)  pravastatin (PRAVACHOL) tablet 40 mg (has no administration in time range)  pantoprazole (PROTONIX) EC tablet 40 mg (has no administration in time range)  clopidogrel (PLAVIX) tablet 75 mg (has no administration in time range)  tacrolimus (PROGRAF) capsule 0.5 mg (has no administration in time range)  0.9 %  sodium chloride infusion ( Intravenous New Bag/Given 03/23/21 2059)  acetaminophen (TYLENOL) tablet 650  mg (has no administration in time range)    Or  acetaminophen (TYLENOL) 160 MG/5ML solution 650 mg (has no administration in time range)    Or  acetaminophen (TYLENOL) suppository 650 mg (has no administration in time range)  enoxaparin (LOVENOX) injection 40 mg (has no administration in time range)  insulin aspart (novoLOG) injection 0-9 Units (has no administration in time range)  hydrALAZINE (APRESOLINE) injection 10 mg (has no administration in time range)  calcium-vitamin D (OSCAL WITH D) 500-5 MG-MCG per tablet 1 tablet (has no administration in time range)  iohexol (OMNIPAQUE) 350 MG/ML injection 75 mL (75 mLs Intravenous Contrast Given 03/23/21 1843)   stroke: mapping our early stages of recovery book ( Does not apply Given 03/23/21 1944)  LORazepam (ATIVAN) injection 0.25 mg (0.25 mg Intravenous Given 03/23/21 2059)    ____________________________________________   MDM / ED COURSE   75 year old male presents to the ED with acute aphasia and dysarthria, resolving quickly upon arrival, likely representing a TIA and requiring medical observation admission for this.  By the time I evaluate the patient, he is neurologically intact, but they provide a great story for what sounds like a TIA with acute and transient  dysarthria and aphasia.  No signs of ICH on CT head, but has a rather large infarct that is likely older than 1 hour and seems more established.  Blood work is unremarkable.  No signs of hypoglycemia or toxidromes.  We will admit to medicine for stroke work-up.  Clinical Course as of 03/23/21 2145  Fri Mar 23, 2021  1836 Discussed with wife while patient is in [place]. [DS]  1909 Discussed with Dr. Rory Percy.  Back to baseline and admit for TIA work-up [DS]    Clinical Course User Index [DS] Vladimir Crofts, MD    ____________________________________________   FINAL CLINICAL IMPRESSION(S) / ED DIAGNOSES  Final diagnoses:  TIA (transient ischemic attack)  Aphasia     ED  Discharge Orders     None        Samaj Wessells Tamala Julian   Note:  This document was prepared using Dragon voice recognition software and may include unintentional dictation errors.    Vladimir Crofts, MD 03/23/21 [phone]

## 2021-03-24 ENCOUNTER — Observation Stay: Admit: 2021-03-24 | Payer: Medicare Other

## 2021-03-24 ENCOUNTER — Observation Stay (HOSPITAL_BASED_OUTPATIENT_CLINIC_OR_DEPARTMENT_OTHER)
Admit: 2021-03-24 | Discharge: 2021-03-24 | Disposition: A | Discharge status: Home / Self Care | Payer: Medicare Other | Attending: Internal Medicine | Admitting: Internal Medicine

## 2021-03-24 DIAGNOSIS — I2510 Atherosclerotic heart disease of native coronary artery without angina pectoris: Secondary | ICD-10-CM | POA: Diagnosis not present

## 2021-03-24 DIAGNOSIS — I10 Essential (primary) hypertension: Secondary | ICD-10-CM | POA: Diagnosis not present

## 2021-03-24 DIAGNOSIS — I429 Cardiomyopathy, unspecified: Secondary | ICD-10-CM

## 2021-03-24 DIAGNOSIS — I639 Cerebral infarction, unspecified: Secondary | ICD-10-CM | POA: Diagnosis not present

## 2021-03-24 DIAGNOSIS — G459 Transient cerebral ischemic attack, unspecified: Secondary | ICD-10-CM | POA: Diagnosis not present

## 2021-03-24 LAB — LIPID PANEL
Cholesterol: 146 mg/dL (ref 0–200)
HDL: 33 mg/dL — ABNORMAL LOW (ref >40)
LDL Cholesterol: 82 mg/dL (ref 0–99)
Total CHOL/HDL Ratio: 4.4 RATIO
Triglycerides: 157 mg/dL — ABNORMAL HIGH (ref <150)
VLDL: 31 mg/dL (ref 0–40)

## 2021-03-24 LAB — RESP PANEL BY RT-PCR (FLU A&B, COVID) ARPGX2
Influenza A by PCR: NEGATIVE
Influenza B by PCR: NEGATIVE
SARS Coronavirus 2 by RT PCR: NEGATIVE

## 2021-03-24 LAB — ECHOCARDIOGRAM COMPLETE
AR max vel: 1.42 cm2
AV Peak grad: 7.8 mmHg
Ao pk vel: 1.4 m/s
Area-P 1/2: 3.65 cm2
Calc EF: 26.5 %
Height: 70 in
S' Lateral: 4.3 cm
Single Plane A2C EF: 22.3 %
Single Plane A4C EF: 32.1 %
Weight: 2784 oz

## 2021-03-24 LAB — CBG MONITORING, ED
Glucose-Capillary: 155 mg/dL — ABNORMAL HIGH (ref 70–99)
Glucose-Capillary: 198 mg/dL — ABNORMAL HIGH (ref 70–99)

## 2021-03-24 LAB — HEMOGLOBIN A1C
Hgb A1c MFr Bld: 7.2 % — ABNORMAL HIGH (ref 4.8–5.6)
Mean Plasma Glucose: 159.94 mg/dL

## 2021-03-24 MED ORDER — METOPROLOL SUCCINATE ER 25 MG PO TB24: 25.0000 mg | ORAL_TABLET | Freq: Every day | ORAL | 1 refills | Status: DC

## 2021-03-24 MED ORDER — PERFLUTREN LIPID MICROSPHERE
1.0000 mL | INTRAVENOUS | Status: AC | PRN
Start: 2021-03-24 — End: 2021-03-24
  Administered 2021-03-24: 3 mL via INTRAVENOUS
  Filled 2021-03-24: qty 10

## 2021-03-24 MED ORDER — LOSARTAN POTASSIUM 25 MG PO TABS: 25.0000 mg | ORAL_TABLET | Freq: Every day | ORAL | 1 refills | Status: DC

## 2021-03-24 MED ORDER — ATORVASTATIN CALCIUM 20 MG PO TABS
40.0000 mg | ORAL_TABLET | Freq: Every day | ORAL | Status: DC
  Administered 2021-03-24: 40 mg via ORAL
  Filled 2021-03-24: qty 2

## 2021-03-24 MED ORDER — ATORVASTATIN CALCIUM 40 MG PO TABS
40.0000 mg | ORAL_TABLET | Freq: Every day | ORAL | 2 refills | Status: AC
Start: 2021-03-25 — End: ?

## 2021-03-24 NOTE — Consult Note (Signed)
[facility]  Patient name: Dean King MRN: [id number] DOB: [birth year] Sex: male   REQUESTING PROVIDER:    ER   REASON FOR CONSULT:    Left brain stroke and left carotid stenosis  HISTORY OF PRESENT ILLNESS:   Dean King is a 75 y.o. male, who presented to the emergency department on 03/23/2021 with difficulty with word finding and dysarthria.  His symptoms have improved, and essentially resolved.  He has not had any prior episodes.  He did not receive tPA.  His work-up revealed a 70% left carotid stenosis and left brain stroke.  The patient is a diabetic.  He is on a statin for hypercholesterolemia.  He is medically managed for hypertension.  He has undergone liver transplantation over a decade ago.  He also has a history of coronary artery disease and is on dual antiplatelet therapy having had PCI.  PAST MEDICAL HISTORY    Past Medical History:  Diagnosis Date   Diabetes mellitus without complication ([place])    Hypertension      FAMILY HISTORY   Family History  Family history unknown: Yes    SOCIAL HISTORY:   Social History   Socioeconomic History   Marital status: Married    Spouse name: Not on file   Number of children: Not on file   Years of education: Not on file   Highest education level: Not on file  Occupational History   Not on file  Tobacco Use   Smoking status: Some Days    Types: Cigars   Smokeless tobacco: Current    Types: Chew  Substance and Sexual Activity   Alcohol use: Never   Drug use: Never   Sexual activity: Not on file  Other Topics Concern   Not on file  Social History Narrative   Not on file   Social Determinants of Health   Financial Resource Strain: Not on file  Food Insecurity: Not on file  Transportation Needs: Not on file  Physical Activity: Not on file  Stress: Not on file  Social Connections: Not on file  Intimate Partner Violence: Not on file     ALLERGIES:    Allergies  Allergen Reactions   Levaquin [Levofloxacin]     CURRENT MEDICATIONS:    Current Facility-Administered Medications  Medication Dose Route Frequency Provider Last Rate Last Admin   0.9 %  sodium chloride infusion   Intravenous Continuous Rise Patience, MD   Stopped at 03/24/21 0430   acetaminophen (TYLENOL) tablet 650 mg  650 mg Oral Q4H PRN Rise Patience, MD       Or   acetaminophen (TYLENOL) 160 MG/5ML solution 650 mg  650 mg Per Tube Q4H PRN Rise Patience, MD       Or   acetaminophen (TYLENOL) suppository 650 mg  650 mg Rectal Q4H PRN Rise Patience, MD       aspirin EC tablet 81 mg  81 mg Oral QHS Rise Patience, MD   81 mg at 03/23/21 2142   atorvastatin (LIPITOR) tablet 40 mg  40 mg Oral Daily Amie Portland, MD   40 mg at 03/24/21 1035   calcium-vitamin D (OSCAL WITH D) 500-5 MG-MCG per tablet 1 tablet  1 tablet Oral TID Rise Patience, MD   1 tablet at 03/24/21 0919   clopidogrel (PLAVIX) tablet 75 mg  75 mg Oral Daily Rise Patience, MD   75 mg at 03/24/21 [phone]  enoxaparin (LOVENOX) injection 40 mg  40 mg Subcutaneous Q24H Rise Patience, MD   40 mg at 03/23/21 2142   hydrALAZINE (APRESOLINE) injection 10 mg  10 mg Intravenous Q4H PRN Rise Patience, MD       insulin aspart (novoLOG) injection 0-9 Units  0-9 Units Subcutaneous TID WC Rise Patience, MD       pantoprazole (PROTONIX) EC tablet 40 mg  40 mg Oral QHS Rise Patience, MD   40 mg at 03/23/21 2142   tacrolimus (PROGRAF) capsule 0.5 mg  0.5 mg Oral BID Rise Patience, MD   0.5 mg at 03/24/21 1035   Current Outpatient Medications  Medication Sig Dispense Refill   amLODipine (NORVASC) 10 MG tablet Take 10 mg by mouth every evening.     aspirin EC 81 MG tablet Take 81 mg by mouth at bedtime.     Calcium Carbonate-Vit D-Min (CALCIUM 600+D3 PLUS MINERALS) 600-800 MG-UNIT TABS Take 1 tablet by mouth 3 (three) times  daily.     clopidogrel (PLAVIX) 75 MG tablet Take 75 mg by mouth daily.     glipiZIDE (GLUCOTROL) 5 MG tablet Take 5 mg by mouth 2 (two) times daily before a meal.     magnesium oxide (MAG-OX) 400 MG tablet Take 400 mg by mouth 2 (two) times daily.     metFORMIN (GLUCOPHAGE) 500 MG tablet Take 1,000 mg by mouth 2 (two) times daily.     metoprolol tartrate (LOPRESSOR) 100 MG tablet Take 100 mg by mouth 2 (two) times daily.     omeprazole (PRILOSEC) 20 MG capsule Take 20 mg by mouth at bedtime.     pravastatin (PRAVACHOL) 40 MG tablet Take 40 mg by mouth at bedtime.     PROGRAF 0.5 MG capsule Take 0.5 mg by mouth 2 (two) times daily.      REVIEW OF SYSTEMS:   [X]  denotes positive finding, [ ]  denotes negative finding Cardiac  Comments:  Chest pain or chest pressure:    Shortness of breath upon exertion:    Short of breath when lying flat:    Irregular heart rhythm:        Vascular    Pain in calf, thigh, or hip brought on by ambulation:    Pain in feet at night that wakes you up from your sleep:     Blood clot in your veins:    Leg swelling:         Pulmonary    Oxygen at home:    Productive cough:     Wheezing:         Neurologic    Sudden weakness in arms or legs:     Sudden numbness in arms or legs:     Sudden onset of difficulty speaking or slurred speech: x   Temporary loss of vision in one eye:     Problems with dizziness:         Gastrointestinal    Blood in stool:      Vomited blood:         Genitourinary    Burning when urinating:     Blood in urine:        Psychiatric    Major depression:         Hematologic    Bleeding problems:    Problems with blood clotting too easily:        Skin    Rashes or ulcers:  Constitutional    Fever or chills:     PHYSICAL EXAM:   Vitals:   03/24/21 0600 03/24/21 0630 03/24/21 0700 03/24/21 0917  BP: (!) 125/58 (!) 117/38 (!) 108/49 132/69  Pulse: 73 77 75 73  Resp: 17 15 16 18   Temp:    97.8 F (36.6 C)   TempSrc:    Oral  SpO2: 96%  92% 95%  Weight:      Height:        GENERAL: The patient is a well-nourished male, in no acute distress. The vital signs are documented above. CARDIAC: There is a regular rate and rhythm.  VASCULAR: Palpable femoral pulses. PULMONARY: Nonlabored respirations ABDOMEN: Soft and non-tender with normal pitched bowel sounds.  MUSCULOSKELETAL: There are no major deformities or cyanosis. NEUROLOGIC: No focal weakness or paresthesias are detected. SKIN: There are no ulcers or rashes noted. PSYCHIATRIC: The patient has a normal affect.  STUDIES:   I have reviewed the following scans:  CTA: 1. 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 No other significant intracranial stenosis. 2. 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 In addition there is approximately 30% narrowing of the proximal left CCA. No other hemodynamically significant stenosis in the neck.  MRI: 1. Subtle focus of diffusion abnormality involving the subcortical aspect of the left frontal lobe, consistent with a small acute ischemic infarct, left MCA distribution. No associated hemorrhage or mass effect. 2. No other acute intracranial abnormality. 3. Chronic right temporoccipital infarct. 4. Underlying mild chronic microvascular ischemic disease.    ASSESSMENT and PLAN   Symptomatic left carotid stenosis: I have reviewed all of his imaging studies.  He has a calcified left carotid stenosis with soft plaque which is the likely source of his stroke.  He is currently being treated with appropriate medical therapy including high-dose statin therapy as well as dual antiplatelet therapy.  He has been seen and evaluated by the stroke team.  I discussed with the patient and his wife that I would recommend carotid intervention to lower his risk for future stroke.  We discussed stenting versus endarterectomy.  I will leave this to the discretion of either Dr. Lucky Cowboy or Dr. Delana Meyer  regarding the treatment modality.  We discussed the details of both procedures as well as the risks and benefits including the risk of stroke during the procedure.  We will reach out to him Monday or Tuesday night to plan for his procedure this week.   Leia Alf, MD, FACS [facility] [phone] Pager [phone]

## 2021-03-24 NOTE — Progress Notes (Signed)
OT Screen Note  Patient Details Name: Dean King MRN: [id number] DOB: 18-Jul-1945   Cancelled Treatment:    Reason Eval/Treat Not Completed: OT screened, no needs identified, will sign off. OT screened. No skilled needs identified. Pt reporting all symptoms have resolved. Back to baseline level of functional independence. No skilled needs identified. Will sign off at this time.   Shara Blazing, M.S., OTR/L [facility] Ascom: [phone] 03/24/21, 11:33 AM

## 2021-03-24 NOTE — Consult Note (Signed)
Cardiology Consultation:   Patient ID: Dean King MRN: [id number]; DOB: 1945/06/16  Admit date: 03/23/2021 Date of Consult: 03/24/2021  PCP:  Albina Billet, MD   [place] Providers Cardiologist:  None   previously followed at [facility]- new to [facility]     Patient Profile:   Dean King is a 75 y.o. male with a hx of CAD, hypertension who is being seen 03/24/2021 for the evaluation of reduced ejection fraction at the request of Dr. Rory Percy.  History of Present Illness:   Dean King is a 75 year old male with history of CAD/PCI X 2 in 2005 at [facility], hypertension, diabetes, liver transplant 2004 who presented due to difficulty speaking.  Patient was with wife yesterday when he suddenly developed difficulty speaking.  He cannot get words out of his mouth and could not say his wife's name.  Wife quickly drove patient to hospital.  In the hospital upon arrival, his symptoms were much improved.  Work-up with MRI brain did show small acute ischemic infarct, chronic right temporal occipital infarct also noted.  CTA neck did not show left-sided 70% narrowing of the ICA.  NIH stroke scale was 0 s/p tPA was not administered.  Evaluated by neurology, started on aspirin, Plavix, Lipitor.  Echocardiogram was ordered, reviewed by myself showing severely reduced ejection fraction, EF 25 to 30%.  Patient denies chest pain, edema, shortness of breath.  He walks about 3 miles daily, states getting occasionally fatigued.  His prior cardiologist at [facility] retired, has not seen a cardiologist for several years now.  He follows up annually at [facility] transplant team due to history of liver transplant.  Outpatient follow-up with vascular surgery plan for possible intervention to left internal carotid artery.  Denies palpitations, EKG showing sinus rhythm.  Patient states feeling well, anxiously waiting to go home.  Plans to follow-up with cardiology as outpatient likely at [facility] due to prior established care.  Past  Medical History:  Diagnosis Date   Diabetes mellitus without complication ([place])    Hypertension     Past Surgical History:  Procedure Laterality Date   CARDIAC CATHETERIZATION     CHOLECYSTECTOMY     CORONARY ANGIOPLASTY     liver transplannt       Home Medications:  Prior to Admission medications   Medication Sig Start Date End Date Taking? Authorizing Provider  amLODipine (NORVASC) 10 MG tablet Take 10 mg by mouth every evening.   Yes [provider]  aspirin EC 81 MG tablet Take 81 mg by mouth at bedtime.   Yes [provider]  Calcium Carbonate-Vit D-Min (CALCIUM 600+D3 PLUS MINERALS) 600-800 MG-UNIT TABS Take 1 tablet by mouth 3 (three) times daily.   Yes [provider]  clopidogrel (PLAVIX) 75 MG tablet Take 75 mg by mouth daily.   Yes [provider]  glipiZIDE (GLUCOTROL) 5 MG tablet Take 5 mg by mouth 2 (two) times daily before a meal.   Yes [provider]  magnesium oxide (MAG-OX) 400 MG tablet Take 400 mg by mouth 2 (two) times daily.   Yes [provider]  metFORMIN (GLUCOPHAGE) 500 MG tablet Take 1,000 mg by mouth 2 (two) times daily.   Yes [provider]  metoprolol tartrate (LOPRESSOR) 100 MG tablet Take 100 mg by mouth 2 (two) times daily.   Yes [provider]  omeprazole (PRILOSEC) 20 MG capsule Take 20 mg by mouth at bedtime.   Yes [provider]  pravastatin (PRAVACHOL) 40 MG  tablet Take 40 mg by mouth at bedtime.   Yes [provider]  PROGRAF 0.5 MG capsule Take 0.5 mg by mouth 2 (two) times daily.   Yes [provider]    Inpatient Medications: Scheduled Meds:  aspirin EC  81 mg Oral QHS   atorvastatin  40 mg Oral Daily   calcium-vitamin D  1 tablet Oral TID   clopidogrel  75 mg Oral Daily   enoxaparin (LOVENOX) injection  40 mg Subcutaneous Q24H   insulin aspart  0-9 Units Subcutaneous TID WC   pantoprazole  40 mg Oral QHS   tacrolimus  0.5 mg Oral BID    Continuous Infusions:  sodium chloride Stopped (03/24/21 0430)   PRN Meds: acetaminophen **OR** acetaminophen (TYLENOL) oral liquid 160 mg/5 mL **OR** acetaminophen, hydrALAZINE  Allergies:    Allergies  Allergen Reactions   Levaquin [Levofloxacin]     Social History:   Social History   Socioeconomic History   Marital status: Married    Spouse name: Not on file   Number of children: Not on file   Years of education: Not on file   Highest education level: Not on file  Occupational History   Not on file  Tobacco Use   Smoking status: Some Days    Types: Cigars   Smokeless tobacco: Current    Types: Chew  Substance and Sexual Activity   Alcohol use: Never   Drug use: Never   Sexual activity: Not on file  Other Topics Concern   Not on file  Social History Narrative   Not on file   Social Determinants of Health   Financial Resource Strain: Not on file  Food Insecurity: Not on file  Transportation Needs: Not on file  Physical Activity: Not on file  Stress: Not on file  Social Connections: Not on file  Intimate Partner Violence: Not on file    Family History:    Family History  Family history unknown: Yes     ROS:  Please see the history of present illness.   All other ROS reviewed and negative.     Physical Exam/Data:   Vitals:   03/24/21 0630 03/24/21 0700 03/24/21 0917 03/24/21 1541  BP: (!) 117/38 (!) 108/49 132/69 (!) 143/74  Pulse: 77 75 73 78  Resp: 15 16 18 16   Temp:   97.8 F (36.6 C) 97.8 F (36.6 C)  TempSrc:   Oral Oral  SpO2:  92% 95% 94%  Weight:      Height:        Intake/Output Summary (Last 24 hours) at 03/24/2021 1624 Last data filed at 03/24/2021 0430 Gross per 24 hour  Intake 563.75 ml  Output --  Net 563.75 ml   Last 3 Weights 03/23/2021  Weight (lbs) 174 lb  Weight (kg) 78.926 kg     Body mass index is 24.97 kg/m.  General:  Well nourished, well developed, in no acute distress HEENT: normal Neck: no  JVD Vascular: No carotid bruits; Distal pulses 2+ bilaterally Cardiac:  normal S1, S2; RRR; no murmur  Lungs:  clear to auscultation bilaterally, no wheezing, rhonchi or rales  Abd: soft, nontender, no hepatomegaly  Ext: no edema Musculoskeletal:  No deformities, BUE and BLE strength normal and equal Skin: warm and dry  Neuro:  CNs 2-12 intact, no focal abnormalities noted Psych:  Normal affect   EKG:  The EKG was personally reviewed and demonstrates: Sinus rhythm Telemetry:  Telemetry was personally reviewed and demonstrates: Sinus rhythm  Relevant CV Studies: TTE 03/24/2021 1. Left ventricular ejection fraction, by estimation, is 25 to 30%. Left  ventricular ejection fraction by 2D MOD biplane is 26.5 %. The left  ventricle has severely decreased function. The left ventricle demonstrates  global hypokinesis. Left ventricular  diastolic parameters are consistent with Grade I diastolic dysfunction  (impaired relaxation). There is akinesis of the left ventricular, entire  anteroseptal wall.   2. Right ventricular systolic function is normal. The right ventricular  size is normal.   3. The mitral valve is normal in structure. No evidence of mitral valve  regurgitation.   4. The aortic valve was not well visualized. Aortic valve regurgitation  is not visualized. Aortic valve sclerosis/calcification is present,  without any evidence of aortic stenosis.   5. The inferior vena cava is normal in size with <50% respiratory  variability, suggesting right atrial pressure of 8 mmHg.   Laboratory Data:  High Sensitivity Troponin:  No results for input(s): TROPONINIHS in the last 720 hours.   Chemistry Recent Labs  Lab 03/23/21 1819  NA 136  K 4.2  CL 102  CO2 27  GLUCOSE 194*  BUN 17  CREATININE 1.10  CALCIUM 9.4  GFRNONAA >60  ANIONGAP 7    Recent Labs  Lab 03/23/21 1819  PROT 7.8  ALBUMIN 3.8  AST 20  ALT 13  ALKPHOS 64  BILITOT 0.5   Lipids  Recent Labs  Lab  03/24/21 0431  CHOL 146  TRIG 157*  HDL 33*  LDLCALC 82  CHOLHDL 4.4    Hematology Recent Labs  Lab 03/23/21 1819  WBC 11.1*  RBC 5.37  HGB 12.8*  HCT 41.2  MCV 76.7*  MCH 23.8*  MCHC 31.1  RDW 19.9*  PLT 273   Thyroid No results for input(s): TSH, FREET4 in the last 168 hours.  BNPNo results for input(s): BNP, PROBNP in the last 168 hours.  DDimer No results for input(s): DDIMER in the last 168 hours.   Radiology/Studies:  MR BRAIN WO CONTRAST  Result Date: 03/23/2021 CLINICAL DATA:  Initial evaluation for neuro deficit, stroke suspected. EXAM: MRI HEAD WITHOUT CONTRAST TECHNIQUE: Multiplanar, multiecho pulse sequences of the brain and surrounding structures were obtained without intravenous contrast. COMPARISON:  Prior CTs from earlier the same day. FINDINGS: Brain: Cerebral volume within normal limits for age. Scattered patchy T2/FLAIR hyperintensity involving the periventricular and deep white matter both cerebral hemispheres most consistent with chronic small vessel ischemic disease, mild in nature. Encephalomalacia and gliosis involving the right temporal occipital region consistent with a chronic ischemic infarct. Associated mild chronic hemosiderin staining within this area. Subtle focus of patchy diffusion abnormality seen involving the subcortical aspect of the left frontal lobe, consistent with a small acute ischemic infarct, left MCA distribution (series 5, image 33). No associated hemorrhage or mass effect. No other diffusion abnormality to suggest acute or subacute ischemia. No other areas of chronic cortical infarction. No other evidence for acute or chronic intracranial hemorrhage. No mass lesion, mass effect or midline shift. No hydrocephalus or extra-axial fluid collection. Pituitary gland suprasellar region normal. Midline structures intact. Vascular: Major intracranial vascular flow voids are maintained. Skull and upper cervical spine: Craniocervical junction within  normal limits. Bone marrow signal intensity normal. No scalp soft tissue abnormality. Sinuses/Orbits: Patient status post bilateral ocular lens replacement. Globes and orbital soft tissues demonstrate no acute finding. Mild scattered mucosal thickening noted within the ethmoidal air cells and maxillary sinuses. Paranasal sinuses are otherwise clear. No significant mastoid  effusion. Other: None. IMPRESSION: 1. Subtle focus of diffusion abnormality involving the subcortical aspect of the left frontal lobe, consistent with a small acute ischemic infarct, left MCA distribution. No associated hemorrhage or mass effect. 2. No other acute intracranial abnormality. 3. Chronic right temporoccipital infarct. 4. Underlying mild chronic microvascular ischemic disease. Electronically Signed   By: Jeannine Boga M.D.   On: 03/23/2021 21:43   ECHOCARDIOGRAM COMPLETE  Result Date: 03/24/2021    ECHOCARDIOGRAM REPORT   Patient Name:   Dean King Date of Exam: 03/24/2021 Medical Rec #:  [id number]     Height:       70.0 in Accession #:    [id number]    Weight:       174.0 lb Date of Birth:  Jun 20, 1945     BSA:          1.967 m Patient Age:    61 years      BP:           127/59 mmHg Patient Gender: M             HR:           77 bpm. Exam Location:  [facility] Procedure: 2D Echo and Intracardiac Opacification Agent Indications:     TIA  History:         Patient has no prior history of Echocardiogram examinations.                  CAD; Risk Factors:Hypertension and Diabetes.  Sonographer:     L. Thornton-Maynard Referring Phys:  [place] Diagnosing Phys: Kate Sable MD IMPRESSIONS  1. Left ventricular ejection fraction, by estimation, is 25 to 30%. Left ventricular ejection fraction by 2D MOD biplane is 26.5 %. The left ventricle has severely decreased function. The left ventricle demonstrates global hypokinesis. Left ventricular diastolic parameters are consistent with Grade I diastolic dysfunction  (impaired relaxation). There is akinesis of the left ventricular, entire anteroseptal wall.  2. Right ventricular systolic function is normal. The right ventricular size is normal.  3. The mitral valve is normal in structure. No evidence of mitral valve regurgitation.  4. The aortic valve was not well visualized. Aortic valve regurgitation is not visualized. Aortic valve sclerosis/calcification is present, without any evidence of aortic stenosis.  5. The inferior vena cava is normal in size with <50% respiratory variability, suggesting right atrial pressure of 8 mmHg. FINDINGS  Left Ventricle: Left ventricular ejection fraction, by estimation, is 25 to 30%. Left ventricular ejection fraction by 2D MOD biplane is 26.5 %. The left ventricle has severely decreased function. The left ventricle demonstrates global hypokinesis. Definity contrast agent was given IV to delineate the left ventricular endocardial borders. The left ventricular internal cavity size was normal in size. There is no left ventricular hypertrophy. Left ventricular diastolic parameters are consistent with Grade I diastolic dysfunction (impaired relaxation). Right Ventricle: The right ventricular size is normal. No increase in right ventricular wall thickness. Right ventricular systolic function is normal. Left Atrium: Left atrial size was normal in size. Right Atrium: Right atrial size was normal in size. Pericardium: There is no evidence of pericardial effusion. Mitral Valve: The mitral valve is normal in structure. No evidence of mitral valve regurgitation. Tricuspid Valve: The tricuspid valve is normal in structure. Tricuspid valve regurgitation is not demonstrated. Aortic Valve: The aortic valve was not well visualized. Aortic valve regurgitation is not visualized. Aortic valve sclerosis/calcification is present, without any evidence of aortic stenosis.  Aortic valve peak gradient measures 7.8 mmHg. Pulmonic Valve: The pulmonic valve was not well  visualized. Pulmonic valve regurgitation is not visualized. Aorta: The aortic root and ascending aorta are structurally normal, with no evidence of dilitation. Venous: The inferior vena cava is normal in size with less than 50% respiratory variability, suggesting right atrial pressure of 8 mmHg. IAS/Shunts: No atrial level shunt detected by color flow Doppler.  LEFT VENTRICLE PLAX 2D                        Biplane EF (MOD) LVIDd:         5.40 cm         LV Biplane EF:   Left LVIDs:         4.30 cm                          ventricular LV PW:         1.30 cm                          ejection LV IVS:        0.80 cm                          fraction by LVOT diam:     2.00 cm                          2D MOD LV SV:         40                               biplane is LV SV Index:   20                               26.5 %. LVOT Area:     3.14 cm                                Diastology                                LV e' medial:    6.09 cm/s LV Volumes (MOD)               LV E/e' medial:  9.7 LV vol d, MOD    139.0 ml      LV e' lateral:   5.44 cm/s A2C:                           LV E/e' lateral: 10.9 LV vol d, MOD    138.0 ml A4C: LV vol s, MOD    108.0 ml A2C: LV vol s, MOD    93.7 ml A4C: LV SV MOD A2C:   31.0 ml LV SV MOD A4C:   138.0 ml LV SV MOD BP:    36.6 ml RIGHT VENTRICLE RV S prime:     16.80 cm/s TAPSE (M-mode): 1.7 cm LEFT ATRIUM             Index  RIGHT ATRIUM           Index LA diam:        3.00 cm 1.52 cm/m   RA Area:     14.60 cm LA Vol (A2C):   35.5 ml 18.04 ml/m  RA Volume:   33.90 ml  17.23 ml/m LA Vol (A4C):   45.6 ml 23.18 ml/m LA Biplane Vol: 40.4 ml 20.54 ml/m  AORTIC VALVE                 PULMONIC VALVE AV Area (Vmax): 1.42 cm     PV Vmax:       1.00 m/s AV Vmax:        140.00 cm/s  PV Peak grad:  4.0 mmHg AV Peak Grad:   7.8 mmHg LVOT Vmax:      63.30 cm/s LVOT Vmean:     47.200 cm/s LVOT VTI:       0.127 m  AORTA Ao Root diam: 3.20 cm Ao Asc diam:  3.30 cm MITRAL VALVE MV Area  (PHT): 3.65 cm    SHUNTS MV Decel Time: 208 msec    Systemic VTI:  0.13 m MV E velocity: 59.10 cm/s  Systemic Diam: 2.00 cm MV A velocity: 94.70 cm/s MV E/A ratio:  0.62 Kate Sable MD Electronically signed by Kate Sable MD Signature Date/Time: 03/24/2021/3:12:11 PM    Final    CT HEAD CODE STROKE WO CONTRAST  Addendum Date: 03/23/2021   ADDENDUM REPORT: 03/23/2021 18:51 ADDENDUM: The area infarction is in the right temporal and parietal lobe, not left. Findings were discussed with Dr. Rory Percy. Electronically Signed   By: San Morelle M.D.   On: 03/23/2021 18:51   Result Date: 03/23/2021 CLINICAL DATA:  Code stroke. Episode of abnormal speech approximately 45 minutes ago. Speech symptoms have resolved. Patient's wife states altered mental status persist. EXAM: CT HEAD WITHOUT CONTRAST TECHNIQUE: Contiguous axial images were obtained from the base of the skull through the vertex without intravenous contrast. COMPARISON:  None. FINDINGS: Brain: A left temporal and parietal lobe infarct extends over 6 cm involvement of the superior temporal gyrus and right occipital lobe. No acute hemorrhage is present. Sulci are effaced. Adjacent ventricle within normal limits without evidence for volume loss. Mild generalized white matter disease is present otherwise. Basal ganglia are otherwise intact. The insular ribbon is normal bilaterally. No significant extraaxial fluid collection is present. The craniocervical junction is normal. Upper cervical spine is within normal limits. Marrow signal is unremarkable. Vascular: Atherosclerotic calcifications are present within the cavernous internal carotid arteries. No hyperdense vessel is present. Skull: Calvarium is intact. No focal lytic or blastic lesions are present. No significant extracranial soft tissue lesion is present. Sinuses/Orbits: Mild mucosal thickening is present within the inferior frontal sinuses bilaterally. The paranasal sinuses and mastoid  air cells are otherwise clear. Bilateral lens replacements are noted. Globes and orbits are otherwise unremarkable. Other: ASPECTS ([place] Stroke Program Early CT Score) - Ganglionic level infarction (caudate, lentiform nuclei, internal capsule, insula, M1-M3 cortex): 6/7 - Supraganglionic infarction (M4-M6 cortex): 2/3 Total score (0-10 with 10 being normal): 8/10 IMPRESSION: 1. 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 Infarct appears acute/subacute, likely greater than 1 hour old. Recommend CTA head and neck with contrast and MR head without contrast for further evaluation. 2. No acute hemorrhage. 3. ASPECTS is 8/10. These results were called by telephone at the time of interpretation on  03/23/2021 at 6:32 pm to provider [facility] , who verbally acknowledged these results. Electronically Signed: By: San Morelle M.D. On: 03/23/2021 18:32   CT ANGIO HEAD NECK W WO CM (CODE STROKE)  Result Date: 03/23/2021 CLINICAL DATA:  Word finding difficulty EXAM: CT ANGIOGRAPHY HEAD AND NECK TECHNIQUE: Multidetector CT imaging of the head and neck was performed using the standard protocol during bolus administration of intravenous contrast. Multiplanar CT image reconstructions and MIPs were obtained to evaluate the vascular anatomy. Carotid stenosis measurements (when applicable) are obtained utilizing NASCET criteria, using the distal internal carotid diameter as the denominator. CONTRAST:  28mL OMNIPAQUE IOHEXOL 350 MG/ML SOLN COMPARISON:  No prior CTA. Correlation is made with 03/23/2021 CT head. FINDINGS: CT HEAD FINDINGS For noncontrast findings, please see same day CT head. CTA NECK FINDINGS Aortic arch: Standard branching. Imaged portion shows no evidence of aneurysm or dissection. No significant stenosis of the major arch vessel origins, although calcified and noncalcified plaque does extend into the origins. Right  carotid system: Calcified and noncalcified plaque at the bifurcation, which causes less than 50% luminal narrowing. No evidence of dissection, stenosis (50% or greater) or occlusion. Left carotid system: Calcified and noncalcified plaque at the bifurcation and in the proximal left ICA causes 70% luminal narrowing in the proximal left ICA. In addition, there is approximately 30% narrowing of the proximal left CCA. No evidence of dissection or occlusion. Vertebral arteries: Right dominant. No evidence of dissection, stenosis (50% or greater) or occlusion. Skeleton: No acute osseous abnormality. Other neck: Prominent left parotid (series 8, image 162) and left level 2A (series 8, image 209) lymph nodes, which each measure up to 6 mm in short axis. These are not of abnormal density or morphology and are favored to be reactive. Upper chest: Negative. Review of the MIP images confirms the above findings CTA HEAD FINDINGS Anterior circulation: Calcified and noncalcified plaque in the intracranial internal carotid arteries, left-greater-than-right, which causes moderate luminal narrowing in the left cavernous segment and moderate narrowing in the right supraclinoid segment. Patent right A1. Hypoplastic left A1. Normal anterior communicating artery. Anterior cerebral arteries are patent to their distal aspects. No M1 stenosis or occlusion. Normal MCA bifurcations. Distal MCA branches perfused and symmetric. Posterior circulation: Vertebral arteries patent to the vertebrobasilar junction with minimal calcification but without significant stenosis. Right dominant. Posterior inferior cerebral arteries patent bilaterally. Basilar patent to its distal aspect. Superior cerebellar arteries patent bilaterally. Bilateral P1 segments originate from the basilar artery. Duplicated left PCA with fetal origin from a patent left posterior communicating artery. PCAs perfused to their distal aspects without stenosis. Venous sinuses: Patent.  Anatomic variants: Duplicated left PCA, with additional fetal origin PCA Review of the MIP images confirms the above findings IMPRESSION: 1. 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 No other significant intracranial stenosis. 2. 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 In addition there is approximately 30% narrowing of the proximal left CCA. No other hemodynamically significant stenosis in the neck. Electronically Signed   By: Merilyn Baba M.D.   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 -He is euvolemic, no chest pain, no shortness of breath -Start Toprol-XL 25 mg daily, losartan 25 mg daily.  Titrate GDMT for CHF as BP permits as outpatient -Patient will need right and left heart cath for ischemic eval  2.  CVA, severely reduced EF -Recommend  cardiac monitor for arrhythmia/A. fib eval as outpatient. -TEE to evaluate possible LA or LV thrombus due to CVA. -Continue aspirin, Plavix, statin as per neuro recs  3.  History of CAD/PCI -Continue aspirin, statin, Plavix -Denies chest pain -Ischemic eval for CHF as above  4.  Currently stenosis, PAD -Aspirin, Plavix, statin -Follow-up with vascular surgery  5.  Hypertension -Start Toprol-XL, losartan. -Stop Lopressor, stop amlodipine  Elective right and left heart cath, TEE cannot be performed until Tuesday due to holiday and staffing issues.  Patient plans to follow-up with cardiology as outpatient, preferably at [facility] due to prior establish care.  If he follows up with [place], we will plan ischemic eval and cardioembolic eval as above.  Close follow-up with cardiology as outpatient strongly emphasized and recommended.  Total encounter time more than 110 minutes  Greater than 50% was spent in counseling and coordination of care with the patient   Signed, Kate Sable, MD  03/24/2021 4:24 PM

## 2021-03-24 NOTE — Progress Notes (Signed)
PT Cancellation Note  Patient Details Name: Dean King MRN: [id number] DOB: December 10, 1945   Cancelled Treatment:    Reason Eval/Treat Not Completed: PT screened, no needs identified, will sign off. PT orders received and pt chart reviewed. Per chart review, pt symptoms have resolved. Pt to confirm this upon PT entry, stating that he has been Ind ambulating to/from bathroom. Pt confirms that his strength, balance, and endurance are at baseline and that he's is going to go on his 39mi walk when he gets home. Pt denies needing skilled PT services at [facility] or DME. PT to sign off. Please re-consult with any changes in status.   Herminio Commons, PT, DPT 10:42 AM,03/24/21

## 2021-03-24 NOTE — Progress Notes (Signed)
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  Left ventricle with global hypokinesis.  IMPRESSIONS   1. Left ventricular ejection fraction, by estimation, is 25 to 30%. Left  ventricular ejection fraction by 2D MOD biplane is 26.5 %. The left  ventricle has severely decreased function. The left ventricle demonstrates  global hypokinesis. Left ventricular  diastolic parameters are consistent with Grade I diastolic dysfunction  (impaired relaxation). There is akinesis of the left ventricular, entire  anteroseptal wall.   2. Right ventricular systolic function is normal. The right ventricular  size is normal.   3. The mitral valve is normal in structure. No evidence of mitral valve  regurgitation.   4. The aortic valve was not well visualized. Aortic valve regurgitation  is not visualized. Aortic valve sclerosis/calcification is present,  without any evidence of aortic stenosis.   5. The inferior vena cava is normal in size with <50% respiratory  variability, suggesting right atrial pressure of 8 mmHg. Left atrial size normal  Appreciate vascular surgery consultation for outpatient follow-up  In the light of above findings, I would appreciate a cardiology input and consultation for the severely diminished ejection fraction 25 to 30%. May need TEE and monitor (but that might not happen till Tuesday per cards).  -- Amie [place], MD Neurologist [facility] Pager: [phone]

## 2021-03-24 NOTE — Progress Notes (Signed)
SLP Cancellation Note  Patient Details Name: ESCHOL AUXIER MRN: [id number] DOB: [birth year]   Cancelled treatment:       Reason Eval/Treat Not Completed: SLP screened, no needs identified, will sign off. Chart reviewed; nsg report speech issues have resolved. Met with pt in room, verbal expression and auditory comprehension are functional for moderately complex conversation. Patient reports speech and language are at baseline. 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  Deneise Lever, [place], CCC-SLP Speech-Language Pathologist    Aliene Altes 03/24/2021, 10:27 AM

## 2021-03-24 NOTE — Discharge Summary (Signed)
Physician Discharge Summary  CARMIN DIBARTOLO XHB:[id number] DOB: 07/09/1945 DOA: 03/23/2021  PCP: Albina Billet, MD  Admit date: 03/23/2021 Discharge date: 03/24/2021  Admitted From: Home Disposition: Home  Recommendations for Outpatient Follow-up:  Follow up with PCP in 1-2 weeks Please obtain BMP/CBC in one week Please follow up with cardiology at [facility] as discussed as well as neurology as indicated  Home Health: None Equipment/Devices: None  Discharge Condition: Stable CODE STATUS: Full Diet recommendation: Low-salt low-fat diet  Brief/Interim Summary: Dean King is a 75 y.o. male with history of liver transplant, CAD status post stenting, hypertension, diabetes mellitus, tobacco use, anemia was brought to the ER after patient started experience difficulty speaking.  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  Cardiology following, given history of PCI and CAD having previously followed at [facility] patient wishes to follow-up outpatient for TEE and further evaluation to rule out left atrial thrombus.  Patient also evaluated by vascular surgery given recent carotid narrowing at 70% recommending outpatient follow-up for further evaluation and treatment. In the meantime cardiology recommending transition from amlodipine and metoprolol tartrate to metoprolol succinate and losartan.   Discharge Instructions   Allergies as of 03/24/2021       Reactions   Levaquin [levofloxacin]         Medication List     STOP taking these medications    amLODipine 10 MG tablet Commonly known as: NORVASC   metoprolol tartrate 100 MG tablet Commonly known as: LOPRESSOR   pravastatin 40 MG tablet Commonly known as: PRAVACHOL        TAKE these medications    aspirin EC 81 MG tablet Take 81 mg by mouth at bedtime.   atorvastatin 40 MG tablet Commonly known as: LIPITOR Take 1 tablet (40 mg total) by mouth daily. Start taking on: March 25, 2021   Calcium 600+D3 Plus Minerals 600-800 MG-UNIT Tabs Take 1 tablet by mouth 3 (three) times daily.   clopidogrel 75 MG tablet Commonly known as: PLAVIX Take 75 mg by mouth daily.   glipiZIDE 5 MG tablet Commonly known as: GLUCOTROL Take 5 mg by mouth 2 (two) times daily before a meal.   losartan 25 MG tablet Commonly known as: Cozaar Take 1 tablet (25 mg total) by mouth daily.   magnesium oxide 400 MG tablet Commonly known as: MAG-OX Take 400 mg by mouth 2 (two) times daily.   metFORMIN 500 MG tablet Commonly known as: GLUCOPHAGE Take 1,000 mg by mouth 2 (two) times daily.   metoprolol succinate 25 MG 24 hr tablet Commonly known as: Toprol XL Take 1 tablet (25 mg total) by mouth daily.   omeprazole 20 MG capsule Commonly known as: PRILOSEC Take 20 mg by mouth at bedtime.   Prograf 0.5 MG capsule Generic drug: tacrolimus Take 0.5 mg by mouth 2 (two) times daily.        Allergies  Allergen Reactions   Levaquin [Levofloxacin]     Consultations: Vascular surgery, cardiology, neurology   Procedures/Studies: MR BRAIN WO CONTRAST  Result Date: 03/23/2021 CLINICAL DATA:  Initial evaluation for neuro deficit, stroke suspected. EXAM: MRI HEAD WITHOUT CONTRAST TECHNIQUE: Multiplanar, multiecho pulse sequences of the brain and surrounding structures were obtained without intravenous contrast. COMPARISON:  Prior CTs from earlier  the same day. FINDINGS: Brain: Cerebral volume within normal limits for age. Scattered patchy T2/FLAIR hyperintensity involving the periventricular and deep white matter both cerebral hemispheres most consistent with chronic small vessel ischemic disease, mild in nature. Encephalomalacia and gliosis involving the right temporal  occipital region consistent with a chronic ischemic infarct. Associated mild chronic hemosiderin staining within this area. Subtle focus of patchy diffusion abnormality seen involving the subcortical aspect of the left frontal lobe, consistent with a small acute ischemic infarct, left MCA distribution (series 5, image 33). No associated hemorrhage or mass effect. No other diffusion abnormality to suggest acute or subacute ischemia. No other areas of chronic cortical infarction. No other evidence for acute or chronic intracranial hemorrhage. No mass lesion, mass effect or midline shift. No hydrocephalus or extra-axial fluid collection. Pituitary gland suprasellar region normal. Midline structures intact. Vascular: Major intracranial vascular flow voids are maintained. Skull and upper cervical spine: Craniocervical junction within normal limits. Bone marrow signal intensity normal. No scalp soft tissue abnormality. Sinuses/Orbits: Patient status post bilateral ocular lens replacement. Globes and orbital soft tissues demonstrate no acute finding. Mild scattered mucosal thickening noted within the ethmoidal air cells and maxillary sinuses. Paranasal sinuses are otherwise clear. No significant mastoid effusion. Other: None. IMPRESSION: 1. Subtle focus of diffusion abnormality involving the subcortical aspect of the left frontal lobe, consistent with a small acute ischemic infarct, left MCA distribution. No associated hemorrhage or mass effect. 2. No other acute intracranial abnormality. 3. Chronic right temporoccipital infarct. 4. Underlying mild chronic microvascular ischemic disease. Electronically Signed   By: Jeannine Boga M.D.   On: 03/23/2021 21:43   ECHOCARDIOGRAM COMPLETE  Result Date: 03/24/2021    ECHOCARDIOGRAM REPORT   Patient Name:   Dean King Date of Exam: 03/24/2021 Medical Rec #:  [id number]     Height:       70.0 in Accession #:    [id number]    Weight:       174.0 lb Date of Birth:   [birth year]     BSA:          1.967 m Patient Age:    75 years      BP:           127/59 mmHg Patient Gender: M             HR:           77 bpm. Exam Location:  [facility] Procedure: 2D Echo and Intracardiac Opacification Agent Indications:     TIA  History:         Patient has no prior history of Echocardiogram examinations.                  CAD; Risk Factors:Hypertension and Diabetes.  Sonographer:     L. Thornton-Maynard Referring Phys:  [place] Diagnosing Phys: Kate Sable MD IMPRESSIONS  1. Left ventricular ejection fraction, by estimation, is 25 to 30%. Left ventricular ejection fraction by 2D MOD biplane is 26.5 %. The left ventricle has severely decreased function. The left ventricle demonstrates global hypokinesis. Left ventricular diastolic parameters are consistent with Grade I diastolic dysfunction (impaired relaxation). There is akinesis of the left ventricular, entire anteroseptal wall.  2. Right ventricular systolic function is normal. The right ventricular size is normal.  3. The mitral valve is normal in structure. No evidence of mitral valve regurgitation.  4. The aortic valve was not well visualized. Aortic valve regurgitation is not visualized. Aortic valve sclerosis/calcification is  present, without any evidence of aortic stenosis.  5. The inferior vena cava is normal in size with <50% respiratory variability, suggesting right atrial pressure of 8 mmHg. FINDINGS  Left Ventricle: Left ventricular ejection fraction, by estimation, is 25 to 30%. Left ventricular ejection fraction by 2D MOD biplane is 26.5 %. The left ventricle has severely decreased function. The left ventricle demonstrates global hypokinesis. Definity contrast agent was given IV to delineate the left ventricular endocardial borders. The left ventricular internal cavity size was normal in size. There is no left ventricular hypertrophy. Left ventricular diastolic parameters are consistent with Grade I diastolic  dysfunction (impaired relaxation). Right Ventricle: The right ventricular size is normal. No increase in right ventricular wall thickness. Right ventricular systolic function is normal. Left Atrium: Left atrial size was normal in size. Right Atrium: Right atrial size was normal in size. Pericardium: There is no evidence of pericardial effusion. Mitral Valve: The mitral valve is normal in structure. No evidence of mitral valve regurgitation. Tricuspid Valve: The tricuspid valve is normal in structure. Tricuspid valve regurgitation is not demonstrated. Aortic Valve: The aortic valve was not well visualized. Aortic valve regurgitation is not visualized. Aortic valve sclerosis/calcification is present, without any evidence of aortic stenosis. Aortic valve peak gradient measures 7.8 mmHg. Pulmonic Valve: The pulmonic valve was not well visualized. Pulmonic valve regurgitation is not visualized. Aorta: The aortic root and ascending aorta are structurally normal, with no evidence of dilitation. Venous: The inferior vena cava is normal in size with less than 50% respiratory variability, suggesting right atrial pressure of 8 mmHg. IAS/Shunts: No atrial level shunt detected by color flow Doppler.  LEFT VENTRICLE PLAX 2D                        Biplane EF (MOD) LVIDd:         5.40 cm         LV Biplane EF:   Left LVIDs:         4.30 cm                          ventricular LV PW:         1.30 cm                          ejection LV IVS:        0.80 cm                          fraction by LVOT diam:     2.00 cm                          2D MOD LV SV:         40                               biplane is LV SV Index:   20                               26.5 %. LVOT Area:     3.14 cm  Diastology                                LV e' medial:    6.09 cm/s LV Volumes (MOD)               LV E/e' medial:  9.7 LV vol d, MOD    139.0 ml      LV e' lateral:   5.44 cm/s A2C:                           LV E/e'  lateral: 10.9 LV vol d, MOD    138.0 ml A4C: LV vol s, MOD    108.0 ml A2C: LV vol s, MOD    93.7 ml A4C: LV SV MOD A2C:   31.0 ml LV SV MOD A4C:   138.0 ml LV SV MOD BP:    36.6 ml RIGHT VENTRICLE RV S prime:     16.80 cm/s TAPSE (M-mode): 1.7 cm LEFT ATRIUM             Index        RIGHT ATRIUM           Index LA diam:        3.00 cm 1.52 cm/m   RA Area:     14.60 cm LA Vol (A2C):   35.5 ml 18.04 ml/m  RA Volume:   33.90 ml  17.23 ml/m LA Vol (A4C):   45.6 ml 23.18 ml/m LA Biplane Vol: 40.4 ml 20.54 ml/m  AORTIC VALVE                 PULMONIC VALVE AV Area (Vmax): 1.42 cm     PV Vmax:       1.00 m/s AV Vmax:        140.00 cm/s  PV Peak grad:  4.0 mmHg AV Peak Grad:   7.8 mmHg LVOT Vmax:      63.30 cm/s LVOT Vmean:     47.200 cm/s LVOT VTI:       0.127 m  AORTA Ao Root diam: 3.20 cm Ao Asc diam:  3.30 cm MITRAL VALVE MV Area (PHT): 3.65 cm    SHUNTS MV Decel Time: 208 msec    Systemic VTI:  0.13 m MV E velocity: 59.10 cm/s  Systemic Diam: 2.00 cm MV A velocity: 94.70 cm/s MV E/A ratio:  0.62 Kate Sable MD Electronically signed by Kate Sable MD Signature Date/Time: 03/24/2021/3:12:11 PM    Final    CT HEAD CODE STROKE WO CONTRAST  Addendum Date: 03/23/2021   ADDENDUM REPORT: 03/23/2021 18:51 ADDENDUM: The area infarction is in the right temporal and parietal lobe, not left. Findings were discussed with Dr. Rory Percy. Electronically Signed   By: San Morelle M.D.   On: 03/23/2021 18:51   Result Date: 03/23/2021 CLINICAL DATA:  Code stroke. Episode of abnormal speech approximately 45 minutes ago. Speech symptoms have resolved. Patient's wife states altered mental status persist. EXAM: CT HEAD WITHOUT CONTRAST TECHNIQUE: Contiguous axial images were obtained from the base of the skull through the vertex without intravenous contrast. COMPARISON:  None. FINDINGS: Brain: A left temporal and parietal lobe infarct extends over 6 cm involvement of the superior temporal gyrus and right  occipital lobe. No acute hemorrhage is present. Sulci are effaced. Adjacent ventricle within normal limits without evidence for volume loss. Mild generalized white matter disease is  present otherwise. Basal ganglia are otherwise intact. The insular ribbon is normal bilaterally. No significant extraaxial fluid collection is present. The craniocervical junction is normal. Upper cervical spine is within normal limits. Marrow signal is unremarkable. Vascular: Atherosclerotic calcifications are present within the cavernous internal carotid arteries. No hyperdense vessel is present. Skull: Calvarium is intact. No focal lytic or blastic lesions are present. No significant extracranial soft tissue lesion is present. Sinuses/Orbits: Mild mucosal thickening is present within the inferior frontal sinuses bilaterally. The paranasal sinuses and mastoid air cells are otherwise clear. Bilateral lens replacements are noted. Globes and orbits are otherwise unremarkable. Other: ASPECTS ([place] Stroke Program Early CT Score) - Ganglionic level infarction (caudate, lentiform nuclei, internal capsule, insula, M1-M3 cortex): 6/7 - Supraganglionic infarction (M4-M6 cortex): 2/3 Total score (0-10 with 10 being normal): 8/10 IMPRESSION: 1. 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 Infarct appears acute/subacute, likely greater than 1 hour old. Recommend CTA head and neck with contrast and MR head without contrast for further evaluation. 2. No acute hemorrhage. 3. ASPECTS is 8/10. These results were called by telephone at the time of interpretation on 03/23/2021 at 6:32 pm to provider [facility] , who verbally acknowledged these results. Electronically Signed: By: San Morelle M.D. On: 03/23/2021 18:32   CT ANGIO HEAD NECK W WO CM (CODE STROKE)  Result Date: 03/23/2021 CLINICAL DATA:  Word finding difficulty EXAM: CT ANGIOGRAPHY HEAD AND NECK  TECHNIQUE: Multidetector CT imaging of the head and neck was performed using the standard protocol during bolus administration of intravenous contrast. Multiplanar CT image reconstructions and MIPs were obtained to evaluate the vascular anatomy. Carotid stenosis measurements (when applicable) are obtained utilizing NASCET criteria, using the distal internal carotid diameter as the denominator. CONTRAST:  40mL OMNIPAQUE IOHEXOL 350 MG/ML SOLN COMPARISON:  No prior CTA. Correlation is made with 03/23/2021 CT head. FINDINGS: CT HEAD FINDINGS For noncontrast findings, please see same day CT head. CTA NECK FINDINGS Aortic arch: Standard branching. Imaged portion shows no evidence of aneurysm or dissection. No significant stenosis of the major arch vessel origins, although calcified and noncalcified plaque does extend into the origins. Right carotid system: Calcified and noncalcified plaque at the bifurcation, which causes less than 50% luminal narrowing. No evidence of dissection, stenosis (50% or greater) or occlusion. Left carotid system: Calcified and noncalcified plaque at the bifurcation and in the proximal left ICA causes 70% luminal narrowing in the proximal left ICA. In addition, there is approximately 30% narrowing of the proximal left CCA. No evidence of dissection or occlusion. Vertebral arteries: Right dominant. No evidence of dissection, stenosis (50% or greater) or occlusion. Skeleton: No acute osseous abnormality. Other neck: Prominent left parotid (series 8, image 162) and left level 2A (series 8, image 209) lymph nodes, which each measure up to 6 mm in short axis. These are not of abnormal density or morphology and are favored to be reactive. Upper chest: Negative. Review of the MIP images confirms the above findings CTA HEAD FINDINGS Anterior circulation: Calcified and noncalcified plaque in the intracranial internal carotid arteries, left-greater-than-right, which causes moderate luminal narrowing in  the left cavernous segment and moderate narrowing in the right supraclinoid segment. Patent right A1. Hypoplastic left A1. Normal anterior communicating artery. Anterior cerebral arteries are patent to their distal aspects. No M1 stenosis or occlusion. Normal MCA bifurcations. Distal MCA branches perfused and symmetric. Posterior circulation: Vertebral arteries patent to the vertebrobasilar junction  with minimal calcification but without significant stenosis. Right dominant. Posterior inferior cerebral arteries patent bilaterally. Basilar patent to its distal aspect. Superior cerebellar arteries patent bilaterally. Bilateral P1 segments originate from the basilar artery. Duplicated left PCA with fetal origin from a patent left posterior communicating artery. PCAs perfused to their distal aspects without stenosis. Venous sinuses: Patent. Anatomic variants: Duplicated left PCA, with additional fetal origin PCA Review of the MIP images confirms the above findings IMPRESSION: 1. 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 No other significant intracranial stenosis. 2. 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 In addition there is approximately 30% narrowing of the proximal left CCA. No other hemodynamically significant stenosis in the neck. Electronically Signed   By: Merilyn Baba M.D.   On: 03/23/2021 19:26     Subjective: No acute issues or events today denies nausea vomiting diarrhea constipation headache fevers chills or chest pain   Discharge Exam: Vitals:   03/24/21 0917 03/24/21 1541  BP: 132/69 (!) 143/74  Pulse: 73 78  Resp: 18 16  Temp: 97.8 F (36.6 C) 97.8 F (36.6 C)  SpO2: 95% 94%   Vitals:   03/24/21 0630 03/24/21 0700 03/24/21 0917 03/24/21 1541  BP: (!) 117/38 (!) 108/49 132/69 (!) 143/74  Pulse: 77 75 73 78  Resp: 15 16 18 16   Temp:   97.8 F (36.6 C) 97.8 F (36.6 C)  TempSrc:   Oral Oral  SpO2:  92% 95% 94%  Weight:      Height:        General: Pt is  alert, awake, not in acute distress Cardiovascular: RRR, S1/S2 +, no rubs, no gallops Respiratory: CTA bilaterally, no wheezing, no rhonchi Abdominal: Soft, NT, ND, bowel sounds + Extremities: no edema, no cyanosis    The results of significant diagnostics from this hospitalization (including imaging, microbiology, ancillary and laboratory) are listed below for reference.     Microbiology: Recent Results (from the past 240 hour(s))  Resp Panel by RT-PCR (Flu A&B, Covid) Nasopharyngeal Swab     Status: None   Collection Time: 03/23/21 11:07 PM   Specimen: Nasopharyngeal Swab; Nasopharyngeal(NP) swabs in vial transport medium  Result Value Ref Range Status   SARS Coronavirus 2 by RT PCR NEGATIVE NEGATIVE Final    Comment: (NOTE) SARS-CoV-2 target nucleic acids are NOT DETECTED.  The SARS-CoV-2 RNA is generally detectable in upper respiratory specimens during the acute phase of infection. The lowest concentration of SARS-CoV-2 viral copies this assay can detect is 138 copies/mL. A negative result does not preclude SARS-Cov-2 infection and should not be used as the sole basis for treatment or other patient management decisions. A negative result may occur with  improper specimen collection/handling, submission of specimen other than nasopharyngeal swab, presence of viral mutation(s) within the areas targeted by this assay, and inadequate number of viral copies(<138 copies/mL). A negative result must be combined with clinical observations, patient history, and epidemiological information. The expected result is Negative.  Fact Sheet for Patients:  EntrepreneurPulse.com.au  Fact Sheet for Healthcare Providers:  IncredibleEmployment.be  This test is no t yet approved or cleared by the [place] FDA and  has been authorized for detection and/or diagnosis of SARS-CoV-2 by FDA under an Emergency Use Authorization (EUA). This EUA will remain  in  effect (meaning this test can be used) for the duration of the COVID-19 declaration under Section 564(b)(1) of the Act, 21 U.S.C.section 360bbb-3(b)(1), unless the authorization is terminated  or revoked sooner.  Influenza A by PCR NEGATIVE NEGATIVE Final   Influenza B by PCR NEGATIVE NEGATIVE Final    Comment: (NOTE) The Xpert Xpress SARS-CoV-2/FLU/RSV plus assay is intended as an aid in the diagnosis of influenza from Nasopharyngeal swab specimens and should not be used as a sole basis for treatment. Nasal washings and aspirates are unacceptable for Xpert Xpress SARS-CoV-2/FLU/RSV testing.  Fact Sheet for Patients: EntrepreneurPulse.com.au  Fact Sheet for Healthcare Providers: IncredibleEmployment.be  This test is not yet approved or cleared by the [place] FDA and has been authorized for detection and/or diagnosis of SARS-CoV-2 by FDA under an Emergency Use Authorization (EUA). This EUA will remain in effect (meaning this test can be used) for the duration of the COVID-19 declaration under Section 564(b)(1) of the Act, 21 U.S.C. section 360bbb-3(b)(1), unless the authorization is terminated or revoked.  Performed at [facility], [address] [place], [place] [address]      Labs: BNP (last 3 results) No results for input(s): BNP in the last 8760 hours. Basic Metabolic Panel: Recent Labs  Lab 03/23/21 1819  NA 136  K 4.2  CL 102  CO2 27  GLUCOSE 194*  BUN 17  CREATININE 1.10  CALCIUM 9.4   Liver Function Tests: Recent Labs  Lab 03/23/21 1819  AST 20  ALT 13  ALKPHOS 64  BILITOT 0.5  PROT 7.8  ALBUMIN 3.8   No results for input(s): LIPASE, AMYLASE in the last 168 hours. No results for input(s): AMMONIA in the last 168 hours. CBC: Recent Labs  Lab 03/23/21 1819  WBC 11.1*  NEUTROABS 7.8*  HGB 12.8*  HCT 41.2  MCV 76.7*  PLT 273   Cardiac Enzymes: No results for input(s): CKTOTAL,  CKMB, CKMBINDEX, TROPONINI in the last 168 hours. BNP: Invalid input(s): POCBNP CBG: Recent Labs  Lab 03/23/21 1815 03/24/21 0733 03/24/21 1211  GLUCAP 203* 155* 198*   D-Dimer No results for input(s): DDIMER in the last 72 hours. Hgb A1c Recent Labs    03/24/21 0431  HGBA1C 7.2*   Lipid Profile Recent Labs    03/24/21 0431  CHOL 146  HDL 33*  LDLCALC 82  TRIG 157*  CHOLHDL 4.4   Thyroid function studies No results for input(s): TSH, T4TOTAL, T3FREE, THYROIDAB in the last 72 hours.  Invalid input(s): FREET3 Anemia work up No results for input(s): VITAMINB12, FOLATE, FERRITIN, TIBC, IRON, RETICCTPCT in the last 72 hours. Urinalysis No results found for: COLORURINE, APPEARANCEUR, [place], [place], GLUCOSEU, [place], [place], [place], PROTEINUR, UROBILINOGEN, NITRITE, LEUKOCYTESUR Sepsis Labs Invalid input(s): PROCALCITONIN,  WBC,  LACTICIDVEN Microbiology Recent Results (from the past 240 hour(s))  Resp Panel by RT-PCR (Flu A&B, Covid) Nasopharyngeal Swab     Status: None   Collection Time: 03/23/21 11:07 PM   Specimen: Nasopharyngeal Swab; Nasopharyngeal(NP) swabs in vial transport medium  Result Value Ref Range Status   SARS Coronavirus 2 by RT PCR NEGATIVE NEGATIVE Final    Comment: (NOTE) SARS-CoV-2 target nucleic acids are NOT DETECTED.  The SARS-CoV-2 RNA is generally detectable in upper respiratory specimens during the acute phase of infection. The lowest concentration of SARS-CoV-2 viral copies this assay can detect is 138 copies/mL. A negative result does not preclude SARS-Cov-2 infection and should not be used as the sole basis for treatment or other patient management decisions. A negative result may occur with  improper specimen collection/handling, submission of specimen other than nasopharyngeal swab, presence of viral mutation(s) within the areas targeted by this assay, and inadequate number of  viral copies(<138 copies/mL). A negative result  must be combined with clinical observations, patient history, and epidemiological information. The expected result is Negative.  Fact Sheet for Patients:  EntrepreneurPulse.com.au  Fact Sheet for Healthcare Providers:  IncredibleEmployment.be  This test is no t yet approved or cleared by the [place] FDA and  has been authorized for detection and/or diagnosis of SARS-CoV-2 by FDA under an Emergency Use Authorization (EUA). This EUA will remain  in effect (meaning this test can be used) for the duration of the COVID-19 declaration under Section 564(b)(1) of the Act, 21 U.S.C.section 360bbb-3(b)(1), unless the authorization is terminated  or revoked sooner.       Influenza A by PCR NEGATIVE NEGATIVE Final   Influenza B by PCR NEGATIVE NEGATIVE Final    Comment: (NOTE) The Xpert Xpress SARS-CoV-2/FLU/RSV plus assay is intended as an aid in the diagnosis of influenza from Nasopharyngeal swab specimens and should not be used as a sole basis for treatment. Nasal washings and aspirates are unacceptable for Xpert Xpress SARS-CoV-2/FLU/RSV testing.  Fact Sheet for Patients: EntrepreneurPulse.com.au  Fact Sheet for Healthcare Providers: IncredibleEmployment.be  This test is not yet approved or cleared by the [place] FDA and has been authorized for detection and/or diagnosis of SARS-CoV-2 by FDA under an Emergency Use Authorization (EUA). This EUA will remain in effect (meaning this test can be used) for the duration of the COVID-19 declaration under Section 564(b)(1) of the Act, 21 U.S.C. section 360bbb-3(b)(1), unless the authorization is terminated or revoked.  Performed at [facility], [address], [place], [place] [address]      Time coordinating discharge: Over 30 minutes  SIGNED:   Little Ishikawa, DO [facility] 03/24/2021, 4:50 PM Pager   If 7PM-7AM,  please contact night-coverage [email or web address]

## 2021-03-24 NOTE — Progress Notes (Signed)
Neurology Progress Note   S:// Seen and examined.  No complaints. MRI brain with a subtle left frontal acute infarct.   O:// Current vital signs: BP (!) 108/49    Pulse 75    Temp 98 F (36.7 C) (Oral)    Resp 16    Ht 5\' 10"  (1.778 m)    Wt 78.9 kg    SpO2 92%    BMI 24.97 kg/m  Vital signs in last 24 hours: Temp:  [98 F (36.7 C)] 98 F (36.7 C) (12/30 1812) Pulse Rate:  [71-88] 75 (12/31 0700) Resp:  [12-21] 16 (12/31 0700) BP: (108-145)/(38-81) 108/49 (12/31 0700) SpO2:  [92 %-99 %] 92 % (12/31 0700) Weight:  [78.9 kg] 78.9 kg (12/30 1815)  General: Awake alert oriented x3 no distress HNT: Normocephalic atraumatic CVS: Regular rhythm Respiratory breathing well saturating normally on room air Abdomen nondistended nontender Extremities warm well perfused Neurological exam Awake alert oriented x3 No dysarthria Cranial nerves II to XII intact Motor exam with no drift Sensation intact light touch Coordination exam with no dysmetria Gait testing deferred at this time NIH stroke scale remains 0  Medications  Current Facility-Administered Medications:    0.9 %  sodium chloride infusion, , Intravenous, Continuous, Rise Patience, MD, Stopped at 03/24/21 0430   acetaminophen (TYLENOL) tablet 650 mg, 650 mg, Oral, Q4H PRN **OR** acetaminophen (TYLENOL) 160 MG/5ML solution 650 mg, 650 mg, Per Tube, Q4H PRN **OR** acetaminophen (TYLENOL) suppository 650 mg, 650 mg, Rectal, Q4H PRN, Rise Patience, MD   aspirin EC tablet 81 mg, 81 mg, Oral, QHS, Rise Patience, MD, 81 mg at 03/23/21 2142   calcium-vitamin D (OSCAL WITH D) 500-5 MG-MCG per tablet 1 tablet, 1 tablet, Oral, TID, Rise Patience, MD   clopidogrel (PLAVIX) tablet 75 mg, 75 mg, Oral, Daily, Rise Patience, MD   enoxaparin (LOVENOX) injection 40 mg, 40 mg, Subcutaneous, Q24H, Rise Patience, MD, 40 mg at 03/23/21 2142   hydrALAZINE (APRESOLINE) injection 10 mg, 10 mg, Intravenous, Q4H  PRN, Rise Patience, MD   insulin aspart (novoLOG) injection 0-9 Units, 0-9 Units, Subcutaneous, TID WC, Rise Patience, MD   pantoprazole (PROTONIX) EC tablet 40 mg, 40 mg, Oral, QHS, Rise Patience, MD, 40 mg at 03/23/21 2142   pravastatin (PRAVACHOL) tablet 40 mg, 40 mg, Oral, QHS, Rise Patience, MD, 40 mg at 03/23/21 2142   tacrolimus (PROGRAF) capsule 0.5 mg, 0.5 mg, Oral, BID, Rise Patience, MD, 0.5 mg at 03/23/21 2142  Current Outpatient Medications:    amLODipine (NORVASC) 10 MG tablet, Take 10 mg by mouth every evening., Disp: , Rfl:    aspirin EC 81 MG tablet, Take 81 mg by mouth at bedtime., Disp: , Rfl:    Calcium Carbonate-Vit D-Min (CALCIUM 600+D3 PLUS MINERALS) 600-800 MG-UNIT TABS, Take 1 tablet by mouth 3 (three) times daily., Disp: , Rfl:    clopidogrel (PLAVIX) 75 MG tablet, Take 75 mg by mouth daily., Disp: , Rfl:    glipiZIDE (GLUCOTROL) 5 MG tablet, Take 5 mg by mouth 2 (two) times daily before a meal., Disp: , Rfl:    magnesium oxide (MAG-OX) 400 MG tablet, Take 400 mg by mouth 2 (two) times daily., Disp: , Rfl:    metFORMIN (GLUCOPHAGE) 500 MG tablet, Take 1,000 mg by mouth 2 (two) times daily., Disp: , Rfl:    metoprolol tartrate (LOPRESSOR) 100 MG tablet, Take 100 mg by mouth 2 (two) times daily., Disp: ,  Rfl:    omeprazole (PRILOSEC) 20 MG capsule, Take 20 mg by mouth at bedtime., Disp: , Rfl:    pravastatin (PRAVACHOL) 40 MG tablet, Take 40 mg by mouth at bedtime., Disp: , Rfl:    PROGRAF 0.5 MG capsule, Take 0.5 mg by mouth 2 (two) times daily., Disp: , Rfl:  Labs CBC    Component Value Date/Time   WBC 11.1 (H) 03/23/2021 1819   RBC 5.37 03/23/2021 1819   HGB 12.8 (L) 03/23/2021 1819   HCT 41.2 03/23/2021 1819   PLT 273 03/23/2021 1819   MCV 76.7 (L) 03/23/2021 1819   MCH 23.8 (L) 03/23/2021 1819   MCHC 31.1 03/23/2021 1819   RDW 19.9 (H) 03/23/2021 1819   LYMPHSABS 2.3 03/23/2021 1819   MONOABS 0.7 03/23/2021 1819   EOSABS  0.2 03/23/2021 1819   BASOSABS 0.1 03/23/2021 1819    CMP     Component Value Date/Time   NA 136 03/23/2021 1819   K 4.2 03/23/2021 1819   CL 102 03/23/2021 1819   CO2 27 03/23/2021 1819   GLUCOSE 194 (H) 03/23/2021 1819   BUN 17 03/23/2021 1819   CREATININE 1.10 03/23/2021 1819   CALCIUM 9.4 03/23/2021 1819   PROT 7.8 03/23/2021 1819   ALBUMIN 3.8 03/23/2021 1819   AST 20 03/23/2021 1819   ALT 13 03/23/2021 1819   ALKPHOS 64 03/23/2021 1819   BILITOT 0.5 03/23/2021 1819   GFRNONAA >60 03/23/2021 1819    glycosylated hemoglobin-pending.  Lipid Panel     Component Value Date/Time   CHOL 146 03/24/2021 0431   TRIG 157 (H) 03/24/2021 0431   HDL 33 (L) 03/24/2021 0431   CHOLHDL 4.4 03/24/2021 0431   VLDL 31 03/24/2021 0431   LDLCALC 82 03/24/2021 0431  LDL 82 2D echo pending   Imaging I have reviewed images in epic and the results pertinent to this consultation are: MR brain:Subtle diffusion abnormality involving the subcortical aspect of the left frontal lobe consistent with a small acute ischemic infarct.  No associated hemorrhage or mass-effect.  Chronic right temporal occipital infarct.  Underlying mild chronic microvascular ischemic disease.  CTA head and neck: Moderate narrowing of the bilateral intracranial ICAs.  No significant intracranial stenosis.  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  Additionally 30% narrowing of the proximal left CCA.  No other hemodynamically significant stenosis in the neck.  Assessment: 75 year old with past medical history of liver transplant, CAD, diabetes, hypertension, presented to the emergency room for evaluation of an episode of word finding difficulty that lasted 10 minutes and he appeared somewhat confused to the wife a little after that.  On examination by telemedicine as a code stroke, NIH stroke scale was 0 hence tPA was not administered.  Emergent head and neck vessel imaging in the form of CT angiography head and neck  did not demonstrate any correctable emergent large vessel occlusion hence not a candidate for thrombectomy. MRI of the brain revealed a subtle acute subcortical infarct in the left frontal lobe in addition to a chronic right temporoparietal lobe stroke-which the patient had no symptoms with or knowledge of. Given the stroke on the left and the prior stroke in the right, an embolic etiology should be entertained.  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  Impression:  Left frontal stroke-cardioembolic versus large vessel. 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 -On home aspirin and Plavix-continue dual antiplatelets. -LDL is 82.  Goal LDL is less than 70.  On pravastatin 40 at home.  Would recommend changing to high intensity statin-atorvastatin 40. -Vascular surgery consultation for internal carotid artery stenosis which is probably symptomatic given the current stroke. -Permissive hypertension for another day or so-long-term blood pressure goal normotension. -If there is no evidence of atrial fibrillation on telemetry, I would recommend outpatient 30-day cardiac monitoring. -PT OT speech therapy -Plan discussed with Dr. Avon Gully over the phone  I will follow-up the 2D echo results with you and update recommendations if necessary-my major concerns are cardiomyopathy, LV thrombus, valvular abnormalities as well as any evidence of left atrial enlargement that can point towards underlying paroxysmal atrial fibrillation.  -- Amie Portland, MD Neurologist [facility] Pager: [phone]

## 2021-03-25 HISTORY — PX: ABLATION: SHX5711

## 2021-04-03 ENCOUNTER — Telehealth (INDEPENDENT_AMBULATORY_CARE_PROVIDER_SITE_OTHER): Payer: Self-pay

## 2021-04-03 ENCOUNTER — Other Ambulatory Visit: Payer: Self-pay

## 2021-04-03 ENCOUNTER — Ambulatory Visit (INDEPENDENT_AMBULATORY_CARE_PROVIDER_SITE_OTHER): Payer: Medicare Other | Admitting: Vascular Surgery

## 2021-04-03 ENCOUNTER — Encounter (INDEPENDENT_AMBULATORY_CARE_PROVIDER_SITE_OTHER): Payer: Self-pay | Admitting: Vascular Surgery

## 2021-04-03 VITALS — BP 108/65 | HR 91 | Resp 18 | Ht 70.0 in | Wt 176.0 lb

## 2021-04-03 DIAGNOSIS — I63239 Cerebral infarction due to unspecified occlusion or stenosis of unspecified carotid arteries: Secondary | ICD-10-CM | POA: Diagnosis not present

## 2021-04-03 DIAGNOSIS — I10 Essential (primary) hypertension: Secondary | ICD-10-CM

## 2021-04-03 DIAGNOSIS — G459 Transient cerebral ischemic attack, unspecified: Secondary | ICD-10-CM

## 2021-04-03 DIAGNOSIS — E119 Type 2 diabetes mellitus without complications: Secondary | ICD-10-CM

## 2021-04-03 DIAGNOSIS — I6522 Occlusion and stenosis of left carotid artery: Secondary | ICD-10-CM

## 2021-04-03 DIAGNOSIS — E785 Hyperlipidemia, unspecified: Secondary | ICD-10-CM

## 2021-04-03 NOTE — H&P (View-Only) (Signed)
Patient ID: Dean King, male   DOB: 10-Jun-1945, 76 y.o.   MRN: [id number]  Chief Complaint  Patient presents with   New Patient (Initial Visit)    Blockage in carotid artery referral from Dr. Hall Busing    HPI Dean King is a 76 y.o. male.  I am asked to see the patient by Dr. Hall Busing for evaluation of symptomatic carotid stenosis.  On New Year's Eve, the patient had an episode of aphasia.  He did not really have arm or leg weakness or numbness, swallowing difficulty, or temporary monocular blindness.  He was admitted to the hospital and found to have a small stroke but his symptoms recovered within 24 hours making this technically more of a TIA.  As part of his work-up he had a CT angiogram which I have independently reviewed.  The official report of this is of a 70% left carotid stenosis and a less than 50% right carotid stenosis.  I would interpret this as an underrepresentation and felt the stenosis was more in the 80 to 85% range.  Either way, this was a greater than 60% symptomatic carotid stenosis and would warrant intervention.  The other thing of note on the CT scan was the lesion went relatively high close to C2 and the angle of the jaw.  His aortic arch appeared reasonable.  There was not marked tortuosity that was apparent.  Calcification was mild to moderate.     Past Medical History:  Diagnosis Date   Diabetes mellitus without complication ([place])    Hypertension     Past Surgical History:  Procedure Laterality Date   CARDIAC CATHETERIZATION     CHOLECYSTECTOMY     CORONARY ANGIOPLASTY     liver transplannt       Family History  Family history unknown: Yes      Social History   Tobacco Use   Smoking status: Some Days    Types: Cigars   Smokeless tobacco: Current    Types: Chew  Substance Use Topics   Alcohol use: Never   Drug use: Never     Allergies  Allergen Reactions   Levaquin [Levofloxacin]     Current Outpatient Medications  Medication Sig  Dispense Refill   aspirin EC 81 MG tablet Take 81 mg by mouth at bedtime.     atorvastatin (LIPITOR) 40 MG tablet Take 1 tablet (40 mg total) by mouth daily. 30 tablet 2   Calcium Carbonate-Vit D-Min (CALCIUM 600+D3 PLUS MINERALS) 600-800 MG-UNIT TABS Take 1 tablet by mouth 3 (three) times daily.     clopidogrel (PLAVIX) 75 MG tablet Take 75 mg by mouth daily.     dapagliflozin propanediol (FARXIGA) 10 MG TABS tablet Take by mouth daily.     glipiZIDE (GLUCOTROL) 5 MG tablet Take 5 mg by mouth 2 (two) times daily before a meal.     losartan (COZAAR) 25 MG tablet Take 1 tablet (25 mg total) by mouth daily. 30 tablet 1   magnesium oxide (MAG-OX) 400 MG tablet Take 400 mg by mouth 2 (two) times daily.     metFORMIN (GLUCOPHAGE) 500 MG tablet Take 1,000 mg by mouth 2 (two) times daily.     metoprolol succinate (TOPROL XL) 25 MG 24 hr tablet Take 1 tablet (25 mg total) by mouth daily. 30 tablet 1   omeprazole (PRILOSEC) 20 MG capsule Take 20 mg by mouth at bedtime.     PROGRAF 0.5 MG capsule Take 0.5 mg by mouth  2 (two) times daily.     No current facility-administered medications for this visit.      REVIEW OF SYSTEMS (Negative unless checked)  Constitutional: [] Weight loss  [] Fever  [] Chills Cardiac: [] Chest pain   [] Chest pressure   [] Palpitations   [] Shortness of breath when laying flat   [] Shortness of breath at rest   [] Shortness of breath with exertion. Vascular:  [] Pain in legs with walking   [] Pain in legs at rest   [] Pain in legs when laying flat   [] Claudication   [] Pain in feet when walking  [] Pain in feet at rest  [] Pain in feet when laying flat   [] History of DVT   [] Phlebitis   [] Swelling in legs   [] Varicose veins   [] Non-healing ulcers Pulmonary:   [] Uses home oxygen   [] Productive cough   [] Hemoptysis   [] Wheeze  [] COPD   [] Asthma Neurologic:  [] Dizziness  [] Blackouts   [] Seizures   [x] History of stroke   [] History of TIA  [] Aphasia   [] Temporary blindness   [] Dysphagia    [] Weakness or numbness in arms   [] Weakness or numbness in legs Musculoskeletal:  [x] Arthritis   [] Joint swelling   [] Joint pain   [] Low back pain Hematologic:  [] Easy bruising  [] Easy bleeding   [] Hypercoagulable state   [] Anemic  [] Hepatitis Gastrointestinal:  [] Blood in stool   [] Vomiting blood  [] Gastroesophageal reflux/heartburn   [] Abdominal pain Genitourinary:  [] Chronic kidney disease   [] Difficult urination  [] Frequent urination  [] Burning with urination   [] Hematuria Skin:  [] Rashes   [] Ulcers   [] Wounds Psychological:  [] History of anxiety   []  History of major depression.    Physical Exam BP 108/65 (BP Location: Left Arm)    Pulse 91    Resp 18    Ht 5\' 10"  (1.778 m)    Wt 176 lb (79.8 kg)    BMI 25.25 kg/m  Gen:  WD/WN, NAD. Appears younger than stated age. Head: [place]/AT, No temporalis wasting.  Ear/Nose/Throat: Hearing grossly intact, nares w/o erythema or drainage, oropharynx w/o Erythema/Exudate Eyes: Conjunctiva clear, sclera non-icteric  Neck: trachea midline.  No JVD.  Pulmonary:  Good air movement, respirations not labored, no use of accessory muscles  Cardiac: RRR, no JVD Vascular:  Vessel Right Left  Radial Palpable Palpable                                   Gastrointestinal:. No masses, surgical incisions, or scars. Musculoskeletal: M/S 5/5 throughout.  Extremities without ischemic changes.  No deformity or atrophy. No edema. Neurologic: Sensation grossly intact in extremities.  Symmetrical.  Speech is fluent. Motor exam as listed above. Psychiatric: Judgment intact, Mood & affect appropriate for pt's clinical situation. Dermatologic: No rashes or ulcers noted.  No cellulitis or open wounds.    Radiology MR BRAIN WO CONTRAST  Result Date: 03/23/2021 CLINICAL DATA:  Initial evaluation for neuro deficit, stroke suspected. EXAM: MRI HEAD WITHOUT CONTRAST TECHNIQUE: Multiplanar, multiecho pulse sequences of the brain and surrounding structures were  obtained without intravenous contrast. COMPARISON:  Prior CTs from earlier the same day. FINDINGS: Brain: Cerebral volume within normal limits for age. Scattered patchy T2/FLAIR hyperintensity involving the periventricular and deep white matter both cerebral hemispheres most consistent with chronic small vessel ischemic disease, mild in nature. Encephalomalacia and gliosis involving the right temporal occipital region consistent with a chronic ischemic infarct. Associated mild chronic hemosiderin staining within this  area. Subtle focus of patchy diffusion abnormality seen involving the subcortical aspect of the left frontal lobe, consistent with a small acute ischemic infarct, left MCA distribution (series 5, image 33). No associated hemorrhage or mass effect. No other diffusion abnormality to suggest acute or subacute ischemia. No other areas of chronic cortical infarction. No other evidence for acute or chronic intracranial hemorrhage. No mass lesion, mass effect or midline shift. No hydrocephalus or extra-axial fluid collection. Pituitary gland suprasellar region normal. Midline structures intact. Vascular: Major intracranial vascular flow voids are maintained. Skull and upper cervical spine: Craniocervical junction within normal limits. Bone marrow signal intensity normal. No scalp soft tissue abnormality. Sinuses/Orbits: Patient status post bilateral ocular lens replacement. Globes and orbital soft tissues demonstrate no acute finding. Mild scattered mucosal thickening noted within the ethmoidal air cells and maxillary sinuses. Paranasal sinuses are otherwise clear. No significant mastoid effusion. Other: None. IMPRESSION: 1. Subtle focus of diffusion abnormality involving the subcortical aspect of the left frontal lobe, consistent with a small acute ischemic infarct, left MCA distribution. No associated hemorrhage or mass effect. 2. No other acute intracranial abnormality. 3. Chronic right temporoccipital  infarct. 4. Underlying mild chronic microvascular ischemic disease. Electronically Signed   By: Jeannine Boga M.D.   On: 03/23/2021 21:43   ECHOCARDIOGRAM COMPLETE  Result Date: 03/24/2021    ECHOCARDIOGRAM REPORT   Patient Name:   Dean King Date of Exam: 03/24/2021 Medical Rec #:  [id number]     Height:       70.0 in Accession #:    [id number]    Weight:       174.0 lb Date of Birth:  10-Aug-1945     BSA:          1.967 m Patient Age:    34 years      BP:           127/59 mmHg Patient Gender: M             HR:           77 bpm. Exam Location:  [facility] Procedure: 2D Echo and Intracardiac Opacification Agent Indications:     TIA  History:         Patient has no prior history of Echocardiogram examinations.                  CAD; Risk Factors:Hypertension and Diabetes.  Sonographer:     L. Thornton-Maynard Referring Phys:  [place] Diagnosing Phys: Kate Sable MD IMPRESSIONS  1. Left ventricular ejection fraction, by estimation, is 25 to 30%. Left ventricular ejection fraction by 2D MOD biplane is 26.5 %. The left ventricle has severely decreased function. The left ventricle demonstrates global hypokinesis. Left ventricular diastolic parameters are consistent with Grade I diastolic dysfunction (impaired relaxation). There is akinesis of the left ventricular, entire anteroseptal wall.  2. Right ventricular systolic function is normal. The right ventricular size is normal.  3. The mitral valve is normal in structure. No evidence of mitral valve regurgitation.  4. The aortic valve was not well visualized. Aortic valve regurgitation is not visualized. Aortic valve sclerosis/calcification is present, without any evidence of aortic stenosis.  5. The inferior vena cava is normal in size with <50% respiratory variability, suggesting right atrial pressure of 8 mmHg. FINDINGS  Left Ventricle: Left ventricular ejection fraction, by estimation, is 25 to 30%. Left ventricular ejection fraction by 2D  MOD biplane is 26.5 %. The left ventricle has severely decreased  function. The left ventricle demonstrates global hypokinesis. Definity contrast agent was given IV to delineate the left ventricular endocardial borders. The left ventricular internal cavity size was normal in size. There is no left ventricular hypertrophy. Left ventricular diastolic parameters are consistent with Grade I diastolic dysfunction (impaired relaxation). Right Ventricle: The right ventricular size is normal. No increase in right ventricular wall thickness. Right ventricular systolic function is normal. Left Atrium: Left atrial size was normal in size. Right Atrium: Right atrial size was normal in size. Pericardium: There is no evidence of pericardial effusion. Mitral Valve: The mitral valve is normal in structure. No evidence of mitral valve regurgitation. Tricuspid Valve: The tricuspid valve is normal in structure. Tricuspid valve regurgitation is not demonstrated. Aortic Valve: The aortic valve was not well visualized. Aortic valve regurgitation is not visualized. Aortic valve sclerosis/calcification is present, without any evidence of aortic stenosis. Aortic valve peak gradient measures 7.8 mmHg. Pulmonic Valve: The pulmonic valve was not well visualized. Pulmonic valve regurgitation is not visualized. Aorta: The aortic root and ascending aorta are structurally normal, with no evidence of dilitation. Venous: The inferior vena cava is normal in size with less than 50% respiratory variability, suggesting right atrial pressure of 8 mmHg. IAS/Shunts: No atrial level shunt detected by color flow Doppler.  LEFT VENTRICLE PLAX 2D                        Biplane EF (MOD) LVIDd:         5.40 cm         LV Biplane EF:   Left LVIDs:         4.30 cm                          ventricular LV PW:         1.30 cm                          ejection LV IVS:        0.80 cm                          fraction by LVOT diam:     2.00 cm                          2D  MOD LV SV:         40                               biplane is LV SV Index:   20                               26.5 %. LVOT Area:     3.14 cm                                Diastology                                LV e' medial:    6.09 cm/s LV Volumes (MOD)  LV E/e' medial:  9.7 LV vol d, MOD    139.0 ml      LV e' lateral:   5.44 cm/s A2C:                           LV E/e' lateral: 10.9 LV vol d, MOD    138.0 ml A4C: LV vol s, MOD    108.0 ml A2C: LV vol s, MOD    93.7 ml A4C: LV SV MOD A2C:   31.0 ml LV SV MOD A4C:   138.0 ml LV SV MOD BP:    36.6 ml RIGHT VENTRICLE RV S prime:     16.80 cm/s TAPSE (M-mode): 1.7 cm LEFT ATRIUM             Index        RIGHT ATRIUM           Index LA diam:        3.00 cm 1.52 cm/m   RA Area:     14.60 cm LA Vol (A2C):   35.5 ml 18.04 ml/m  RA Volume:   33.90 ml  17.23 ml/m LA Vol (A4C):   45.6 ml 23.18 ml/m LA Biplane Vol: 40.4 ml 20.54 ml/m  AORTIC VALVE                 PULMONIC VALVE AV Area (Vmax): 1.42 cm     PV Vmax:       1.00 m/s AV Vmax:        140.00 cm/s  PV Peak grad:  4.0 mmHg AV Peak Grad:   7.8 mmHg LVOT Vmax:      63.30 cm/s LVOT Vmean:     47.200 cm/s LVOT VTI:       0.127 m  AORTA Ao Root diam: 3.20 cm Ao Asc diam:  3.30 cm MITRAL VALVE MV Area (PHT): 3.65 cm    SHUNTS MV Decel Time: 208 msec    Systemic VTI:  0.13 m MV E velocity: 59.10 cm/s  Systemic Diam: 2.00 cm MV A velocity: 94.70 cm/s MV E/A ratio:  0.62 Kate Sable MD Electronically signed by Kate Sable MD Signature Date/Time: 03/24/2021/3:12:11 PM    Final    CT HEAD CODE STROKE WO CONTRAST  Addendum Date: 03/23/2021   ADDENDUM REPORT: 03/23/2021 18:51 ADDENDUM: The area infarction is in the right temporal and parietal lobe, not left. Findings were discussed with Dr. Rory Percy. Electronically Signed   By: San Morelle M.D.   On: 03/23/2021 18:51   Result Date: 03/23/2021 CLINICAL DATA:  Code stroke. Episode of abnormal speech approximately 45 minutes ago.  Speech symptoms have resolved. Patient's wife states altered mental status persist. EXAM: CT HEAD WITHOUT CONTRAST TECHNIQUE: Contiguous axial images were obtained from the base of the skull through the vertex without intravenous contrast. COMPARISON:  None. FINDINGS: Brain: A left temporal and parietal lobe infarct extends over 6 cm involvement of the superior temporal gyrus and right occipital lobe. No acute hemorrhage is present. Sulci are effaced. Adjacent ventricle within normal limits without evidence for volume loss. Mild generalized white matter disease is present otherwise. Basal ganglia are otherwise intact. The insular ribbon is normal bilaterally. No significant extraaxial fluid collection is present. The craniocervical junction is normal. Upper cervical spine is within normal limits. Marrow signal is unremarkable. Vascular: Atherosclerotic calcifications are present within the cavernous internal carotid arteries. No hyperdense vessel is present. Skull: Calvarium is intact. No focal  lytic or blastic lesions are present. No significant extracranial soft tissue lesion is present. Sinuses/Orbits: Mild mucosal thickening is present within the inferior frontal sinuses bilaterally. The paranasal sinuses and mastoid air cells are otherwise clear. Bilateral lens replacements are noted. Globes and orbits are otherwise unremarkable. Other: ASPECTS ([place] Stroke Program Early CT Score) - Ganglionic level infarction (caudate, lentiform nuclei, internal capsule, insula, M1-M3 cortex): 6/7 - Supraganglionic infarction (M4-M6 cortex): 2/3 Total score (0-10 with 10 being normal): 8/10 IMPRESSION: 1. 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 Infarct appears acute/subacute, likely greater than 1 hour old. Recommend CTA head and neck with contrast and MR head without contrast for further evaluation. 2. No acute hemorrhage. 3. ASPECTS is  8/10. These results were called by telephone at the time of interpretation on 03/23/2021 at 6:32 pm to provider [facility] , who verbally acknowledged these results. Electronically Signed: By: San Morelle M.D. On: 03/23/2021 18:32   CT ANGIO HEAD NECK W WO CM (CODE STROKE)  Result Date: 03/23/2021 CLINICAL DATA:  Word finding difficulty EXAM: CT ANGIOGRAPHY HEAD AND NECK TECHNIQUE: Multidetector CT imaging of the head and neck was performed using the standard protocol during bolus administration of intravenous contrast. Multiplanar CT image reconstructions and MIPs were obtained to evaluate the vascular anatomy. Carotid stenosis measurements (when applicable) are obtained utilizing NASCET criteria, using the distal internal carotid diameter as the denominator. CONTRAST:  37mL OMNIPAQUE IOHEXOL 350 MG/ML SOLN COMPARISON:  No prior CTA. Correlation is made with 03/23/2021 CT head. FINDINGS: CT HEAD FINDINGS For noncontrast findings, please see same day CT head. CTA NECK FINDINGS Aortic arch: Standard branching. Imaged portion shows no evidence of aneurysm or dissection. No significant stenosis of the major arch vessel origins, although calcified and noncalcified plaque does extend into the origins. Right carotid system: Calcified and noncalcified plaque at the bifurcation, which causes less than 50% luminal narrowing. No evidence of dissection, stenosis (50% or greater) or occlusion. Left carotid system: Calcified and noncalcified plaque at the bifurcation and in the proximal left ICA causes 70% luminal narrowing in the proximal left ICA. In addition, there is approximately 30% narrowing of the proximal left CCA. No evidence of dissection or occlusion. Vertebral arteries: Right dominant. No evidence of dissection, stenosis (50% or greater) or occlusion. Skeleton: No acute osseous abnormality. Other neck: Prominent left parotid (series 8, image 162) and left level 2A (series 8, image 209) lymph nodes,  which each measure up to 6 mm in short axis. These are not of abnormal density or morphology and are favored to be reactive. Upper chest: Negative. Review of the MIP images confirms the above findings CTA HEAD FINDINGS Anterior circulation: Calcified and noncalcified plaque in the intracranial internal carotid arteries, left-greater-than-right, which causes moderate luminal narrowing in the left cavernous segment and moderate narrowing in the right supraclinoid segment. Patent right A1. Hypoplastic left A1. Normal anterior communicating artery. Anterior cerebral arteries are patent to their distal aspects. No M1 stenosis or occlusion. Normal MCA bifurcations. Distal MCA branches perfused and symmetric. Posterior circulation: Vertebral arteries patent to the vertebrobasilar junction with minimal calcification but without significant stenosis. Right dominant. Posterior inferior cerebral arteries patent bilaterally. Basilar patent to its distal aspect. Superior cerebellar arteries patent bilaterally. Bilateral P1 segments originate from the basilar artery. Duplicated left PCA with fetal origin from a patent left posterior communicating artery. PCAs perfused to their distal aspects without stenosis. Venous sinuses: Patent.  Anatomic variants: Duplicated left PCA, with additional fetal origin PCA Review of the MIP images confirms the above findings IMPRESSION: 1. 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 No other significant intracranial stenosis. 2. 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 In addition there is approximately 30% narrowing of the proximal left CCA. No other hemodynamically significant stenosis in the neck. Electronically Signed   By: Merilyn Baba M.D.   On: 03/23/2021 19:26    Labs Recent Results (from the past 2160 hour(s))  CBG monitoring, ED     Status: Abnormal   Collection Time: 03/23/21  6:15 PM  Result Value Ref Range   Glucose-Capillary 203 (H) 70 - 99 mg/dL    Comment:  Glucose reference range applies only to samples taken after fasting for at least 8 hours.  Ethanol     Status: None   Collection Time: 03/23/21  6:19 PM  Result Value Ref Range   Alcohol, Ethyl (B) <10 <10 mg/dL    Comment: (NOTE) Lowest detectable limit for serum alcohol is 10 mg/dL.  For medical purposes only. Performed at [facility], [address] [place], [place] [address]   Protime-INR     Status: None   Collection Time: 03/23/21  6:19 PM  Result Value Ref Range   Prothrombin Time 12.6 11.4 - 15.2 seconds   INR 1.0 0.8 - 1.2    Comment: (NOTE) INR goal varies based on device and disease states. Performed at [facility], [address] [place], [place] [address]   APTT     Status: None   Collection Time: 03/23/21  6:19 PM  Result Value Ref Range   aPTT 29 24 - 36 seconds    Comment: Performed at [facility], [address] [place], [place] [address]  CBC     Status: Abnormal   Collection Time: 03/23/21  6:19 PM  Result Value Ref Range   WBC 11.1 (H) 4.0 - 10.5 K/uL   RBC 5.37 4.22 - 5.81 MIL/uL   Hemoglobin 12.8 (L) 13.0 - 17.0 g/dL   HCT 41.2 39.0 - 52.0 %   MCV 76.7 (L) 80.0 - 100.0 fL   MCH 23.8 (L) 26.0 - 34.0 pg   MCHC 31.1 30.0 - 36.0 g/dL   RDW 19.9 (H) 11.5 - 15.5 %   Platelets 273 150 - 400 K/uL   nRBC 0.0 0.0 - 0.2 %    Comment: Performed at [facility], [address] [place], [place] [address]  Differential     Status: Abnormal   Collection Time: 03/23/21  6:19 PM  Result Value Ref Range   Neutrophils Relative % 71 %   Neutro Abs 7.8 (H) 1.7 - 7.7 K/uL   Lymphocytes Relative 20 %   Lymphs Abs 2.3 0.7 - 4.0 K/uL   Monocytes Relative 7 %   Monocytes Absolute 0.7 0.1 - 1.0 K/uL   Eosinophils Relative 2 %   Eosinophils Absolute 0.2 0.0 - 0.5 K/uL   Basophils Relative 0 %   Basophils Absolute 0.1 0.0 - 0.1 K/uL   Immature Granulocytes 0 %   Abs Immature Granulocytes 0.04 0.00 - 0.07 K/uL    Comment:  Performed at [facility], [address] [place], [place] [address]  Comprehensive metabolic panel     Status: Abnormal   Collection Time: 03/23/21  6:19 PM  Result Value Ref Range   Sodium 136 135 - 145 mmol/L   Potassium 4.2 3.5 - 5.1  mmol/L   Chloride 102 98 - 111 mmol/L   CO2 27 22 - 32 mmol/L   Glucose, Bld 194 (H) 70 - 99 mg/dL    Comment: Glucose reference range applies only to samples taken after fasting for at least 8 hours.   BUN 17 8 - 23 mg/dL   Creatinine, Ser 1.10 0.61 - 1.24 mg/dL   Calcium 9.4 8.9 - 10.3 mg/dL   Total Protein 7.8 6.5 - 8.1 g/dL   Albumin 3.8 3.5 - 5.0 g/dL   AST 20 15 - 41 U/L   ALT 13 0 - 44 U/L   Alkaline Phosphatase 64 38 - 126 U/L   Total Bilirubin 0.5 0.3 - 1.2 mg/dL   GFR, Estimated >60 >60 mL/min    Comment: (NOTE) Calculated using the CKD-EPI Creatinine Equation (2021)    Anion gap 7 5 - 15    Comment: Performed at [facility], [address] [place], [place] [address]  Urine Drug Screen, Qualitative     Status: None   Collection Time: 03/23/21  8:00 PM  Result Value Ref Range   Tricyclic, Ur Screen NONE DETECTED NONE DETECTED   Amphetamines, Ur Screen NONE DETECTED NONE DETECTED   MDMA (Ecstasy)Ur Screen NONE DETECTED NONE DETECTED   Cocaine Metabolite,Ur [place] NONE DETECTED NONE DETECTED   Opiate, Ur Screen NONE DETECTED NONE DETECTED   Phencyclidine (PCP) Ur S NONE DETECTED NONE DETECTED   Cannabinoid 50 Ng, Ur  NONE DETECTED NONE DETECTED   Barbiturates, Ur Screen NONE DETECTED NONE DETECTED   Benzodiazepine, Ur Scrn NONE DETECTED NONE DETECTED   Methadone Scn, Ur NONE DETECTED NONE DETECTED    Comment: (NOTE) Tricyclics + metabolites, urine    Cutoff 1000 ng/mL Amphetamines + metabolites, urine  Cutoff 1000 ng/mL MDMA (Ecstasy), urine              Cutoff 500 ng/mL Cocaine Metabolite, urine          Cutoff 300 ng/mL Opiate + metabolites, urine        Cutoff 300 ng/mL Phencyclidine (PCP), urine          Cutoff 25 ng/mL Cannabinoid, urine                 Cutoff 50 ng/mL Barbiturates + metabolites, urine  Cutoff 200 ng/mL Benzodiazepine, urine              Cutoff 200 ng/mL Methadone, urine                   Cutoff 300 ng/mL  The urine drug screen provides only a preliminary, unconfirmed analytical test result and should not be used for non-medical purposes. Clinical consideration and professional judgment should be applied to any positive drug screen result due to possible interfering substances. A more specific alternate chemical method must be used in order to obtain a confirmed analytical result. Gas chromatography / mass spectrometry (GC/MS) is the preferred confirm atory method. Performed at [facility], [place], [place] [id number]   Resp Panel by RT-PCR (Flu A&B, Covid) Nasopharyngeal Swab     Status: None   Collection Time: 03/23/21 11:07 PM   Specimen: Nasopharyngeal Swab; Nasopharyngeal(NP) swabs in vial transport medium  Result Value Ref Range   SARS Coronavirus 2 by RT PCR NEGATIVE NEGATIVE    Comment: (NOTE) SARS-CoV-2 target nucleic acids are NOT DETECTED.  The SARS-CoV-2 RNA is generally detectable in upper respiratory specimens during the acute phase of infection. The  lowest concentration of SARS-CoV-2 viral copies this assay can detect is 138 copies/mL. A negative result does not preclude SARS-Cov-2 infection and should not be used as the sole basis for treatment or other patient management decisions. A negative result may occur with  improper specimen collection/handling, submission of specimen other than nasopharyngeal swab, presence of viral mutation(s) within the areas targeted by this assay, and inadequate number of viral copies(<138 copies/mL). A negative result must be combined with clinical observations, patient history, and epidemiological information. The expected result is Negative.  Fact Sheet for Patients:   EntrepreneurPulse.com.au  Fact Sheet for Healthcare Providers:  IncredibleEmployment.be  This test is no t yet approved or cleared by the [place] FDA and  has been authorized for detection and/or diagnosis of SARS-CoV-2 by FDA under an Emergency Use Authorization (EUA). This EUA will remain  in effect (meaning this test can be used) for the duration of the COVID-19 declaration under Section 564(b)(1) of the Act, 21 U.S.C.section 360bbb-3(b)(1), unless the authorization is terminated  or revoked sooner.       Influenza A by PCR NEGATIVE NEGATIVE   Influenza B by PCR NEGATIVE NEGATIVE    Comment: (NOTE) The Xpert Xpress SARS-CoV-2/FLU/RSV plus assay is intended as an aid in the diagnosis of influenza from Nasopharyngeal swab specimens and should not be used as a sole basis for treatment. Nasal washings and aspirates are unacceptable for Xpert Xpress SARS-CoV-2/FLU/RSV testing.  Fact Sheet for Patients: EntrepreneurPulse.com.au  Fact Sheet for Healthcare Providers: IncredibleEmployment.be  This test is not yet approved or cleared by the [place] FDA and has been authorized for detection and/or diagnosis of SARS-CoV-2 by FDA under an Emergency Use Authorization (EUA). This EUA will remain in effect (meaning this test can be used) for the duration of the COVID-19 declaration under Section 564(b)(1) of the Act, 21 U.S.C. section 360bbb-3(b)(1), unless the authorization is terminated or revoked.  Performed at [facility], [address] [place], [place] [address]   Hemoglobin A1c     Status: Abnormal   Collection Time: 03/24/21  4:31 AM  Result Value Ref Range   Hgb A1c MFr Bld 7.2 (H) 4.8 - 5.6 %    Comment: (NOTE) Pre diabetes:          5.7%-6.4%  Diabetes:              >6.4%  Glycemic control for   <7.0% adults with diabetes    Mean Plasma Glucose 159.94 mg/dL    Comment:  Performed at [facility] [address] [place], [place] [address]  Lipid panel     Status: Abnormal   Collection Time: 03/24/21  4:31 AM  Result Value Ref Range   Cholesterol 146 0 - 200 mg/dL   Triglycerides 157 (H) <150 mg/dL   HDL 33 (L) >40 mg/dL   Total CHOL/HDL Ratio 4.4 RATIO   VLDL 31 0 - 40 mg/dL   LDL Cholesterol 82 0 - 99 mg/dL    Comment:        Total Cholesterol/HDL:CHD Risk Coronary Heart Disease Risk Table                     Men   Women  1/2 Average Risk   3.4   3.3  Average Risk       5.0   4.4  2 X Average Risk   9.6   7.1  3 X Average Risk  23.4   11.0  Use the calculated Patient Ratio above and the CHD Risk Table to determine the patient's CHD Risk.        ATP III CLASSIFICATION (LDL):  <100     mg/dL   Optimal  100-129  mg/dL   Near or Above                    Optimal  130-159  mg/dL   Borderline  160-189  mg/dL   High  >190     mg/dL   Very High Performed at [facility], [address] [place], [place] [address]   CBG monitoring, ED     Status: Abnormal   Collection Time: 03/24/21  7:33 AM  Result Value Ref Range   Glucose-Capillary 155 (H) 70 - 99 mg/dL    Comment: Glucose reference range applies only to samples taken after fasting for at least 8 hours.  CBG monitoring, ED     Status: Abnormal   Collection Time: 03/24/21 12:11 PM  Result Value Ref Range   Glucose-Capillary 198 (H) 70 - 99 mg/dL    Comment: Glucose reference range applies only to samples taken after fasting for at least 8 hours.  ECHOCARDIOGRAM COMPLETE     Status: None   Collection Time: 03/24/21 12:42 PM  Result Value Ref Range   Weight 2,784 oz   Height 70 in   BP 132/69 mmHg   Ao pk vel 1.40 m/s   AR max vel 1.42 cm2   AV Peak grad 7.8 mmHg   Single Plane A2C EF 22.3 %   Single Plane A4C EF 32.1 %   Calc EF 26.5 %   S' Lateral 4.30 cm   Area-P 1/2 3.65 cm2    Assessment/Plan:  Carotid stenosis, symptomatic, with infarction [facility]) The  patient has a high-grade left carotid artery stenosis officially reported on the CT angiogram of 70% but appearing significantly worse than that to me.  This caused a stroke a little over a week ago.  This should be intervened upon to reduce stroke risk reduction.  In evaluating his CT angiogram, the lesion tracks fairly high.  This may not entirely preclude surgery, but I do believe it would make it fairly difficult.  For this reason, I think proceeding with carotid artery stenting would be in his best interest.  He is on Plavix already.  I discussed the risks and benefits of carotid artery stenting.  The patient voices understanding and is agreeable to proceed.  Essential hypertension blood pressure control important in reducing the progression of atherosclerotic disease. On appropriate oral medications.   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  Plan intervention  Diabetes mellitus ([place]) blood glucose control important in reducing the progression of atherosclerotic disease. Also, involved in wound healing. On appropriate medications.   HLD (hyperlipidemia) lipid control important in reducing the progression of atherosclerotic disease. Continue statin therapy      [name] 04/03/2021, 2:12 PM   This note was created with Dragon medical transcription system.  Any errors from dictation are unintentional.

## 2021-04-03 NOTE — Assessment & Plan Note (Signed)
lipid control important in reducing the progression of atherosclerotic disease. Continue statin therapy  

## 2021-04-03 NOTE — Progress Notes (Signed)
Patient ID: Dean King, male   DOB: [birth year], 76 y.o.   MRN: [id number]  Chief Complaint  Patient presents with   New Patient (Initial Visit)    Blockage in carotid artery referral from Dr. Hall Busing    HPI Dean EIFLER is a 76 y.o. male.  I am asked to see the patient by Dr. Hall Busing for evaluation of symptomatic carotid stenosis.  On New Year's Eve, the patient had an episode of aphasia.  He did not really have arm or leg weakness or numbness, swallowing difficulty, or temporary monocular blindness.  He was admitted to the hospital and found to have a small stroke but his symptoms recovered within 24 hours making this technically more of a TIA.  As part of his work-up he had a CT angiogram which I have independently reviewed.  The official report of this is of a 70% left carotid stenosis and a less than 50% right carotid stenosis.  I would interpret this as an underrepresentation and felt the stenosis was more in the 80 to 85% range.  Either way, this was a greater than 60% symptomatic carotid stenosis and would warrant intervention.  The other thing of note on the CT scan was the lesion went relatively high close to C2 and the angle of the jaw.  His aortic arch appeared reasonable.  There was not marked tortuosity that was apparent.  Calcification was mild to moderate.     Past Medical History:  Diagnosis Date   Diabetes mellitus without complication ([place])    Hypertension     Past Surgical History:  Procedure Laterality Date   CARDIAC CATHETERIZATION     CHOLECYSTECTOMY     CORONARY ANGIOPLASTY     liver transplannt       Family History  Family history unknown: Yes      Social History   Tobacco Use   Smoking status: Some Days    Types: Cigars   Smokeless tobacco: Current    Types: Chew  Substance Use Topics   Alcohol use: Never   Drug use: Never     Allergies  Allergen Reactions   Levaquin [Levofloxacin]     Current Outpatient Medications  Medication Sig  Dispense Refill   aspirin EC 81 MG tablet Take 81 mg by mouth at bedtime.     atorvastatin (LIPITOR) 40 MG tablet Take 1 tablet (40 mg total) by mouth daily. 30 tablet 2   Calcium Carbonate-Vit D-Min (CALCIUM 600+D3 PLUS MINERALS) 600-800 MG-UNIT TABS Take 1 tablet by mouth 3 (three) times daily.     clopidogrel (PLAVIX) 75 MG tablet Take 75 mg by mouth daily.     dapagliflozin propanediol (FARXIGA) 10 MG TABS tablet Take by mouth daily.     glipiZIDE (GLUCOTROL) 5 MG tablet Take 5 mg by mouth 2 (two) times daily before a meal.     losartan (COZAAR) 25 MG tablet Take 1 tablet (25 mg total) by mouth daily. 30 tablet 1   magnesium oxide (MAG-OX) 400 MG tablet Take 400 mg by mouth 2 (two) times daily.     metFORMIN (GLUCOPHAGE) 500 MG tablet Take 1,000 mg by mouth 2 (two) times daily.     metoprolol succinate (TOPROL XL) 25 MG 24 hr tablet Take 1 tablet (25 mg total) by mouth daily. 30 tablet 1   omeprazole (PRILOSEC) 20 MG capsule Take 20 mg by mouth at bedtime.     PROGRAF 0.5 MG capsule Take 0.5 mg by mouth  2 (two) times daily.     No current facility-administered medications for this visit.      REVIEW OF SYSTEMS (Negative unless checked)  Constitutional: [] Weight loss  [] Fever  [] Chills Cardiac: [] Chest pain   [] Chest pressure   [] Palpitations   [] Shortness of breath when laying flat   [] Shortness of breath at rest   [] Shortness of breath with exertion. Vascular:  [] Pain in legs with walking   [] Pain in legs at rest   [] Pain in legs when laying flat   [] Claudication   [] Pain in feet when walking  [] Pain in feet at rest  [] Pain in feet when laying flat   [] History of DVT   [] Phlebitis   [] Swelling in legs   [] Varicose veins   [] Non-healing ulcers Pulmonary:   [] Uses home oxygen   [] Productive cough   [] Hemoptysis   [] Wheeze  [] COPD   [] Asthma Neurologic:  [] Dizziness  [] Blackouts   [] Seizures   [x] History of stroke   [] History of TIA  [] Aphasia   [] Temporary blindness   [] Dysphagia    [] Weakness or numbness in arms   [] Weakness or numbness in legs Musculoskeletal:  [x] Arthritis   [] Joint swelling   [] Joint pain   [] Low back pain Hematologic:  [] Easy bruising  [] Easy bleeding   [] Hypercoagulable state   [] Anemic  [] Hepatitis Gastrointestinal:  [] Blood in stool   [] Vomiting blood  [] Gastroesophageal reflux/heartburn   [] Abdominal pain Genitourinary:  [] Chronic kidney disease   [] Difficult urination  [] Frequent urination  [] Burning with urination   [] Hematuria Skin:  [] Rashes   [] Ulcers   [] Wounds Psychological:  [] History of anxiety   []  History of major depression.    Physical Exam BP 108/65 (BP Location: Left Arm)    Pulse 91    Resp 18    Ht 5\' 10"  (1.778 m)    Wt 176 lb (79.8 kg)    BMI 25.25 kg/m  Gen:  WD/WN, NAD. Appears younger than stated age. Head: [place]/AT, No temporalis wasting.  Ear/Nose/Throat: Hearing grossly intact, nares w/o erythema or drainage, oropharynx w/o Erythema/Exudate Eyes: Conjunctiva clear, sclera non-icteric  Neck: trachea midline.  No JVD.  Pulmonary:  Good air movement, respirations not labored, no use of accessory muscles  Cardiac: RRR, no JVD Vascular:  Vessel Right Left  Radial Palpable Palpable                                   Gastrointestinal:. No masses, surgical incisions, or scars. Musculoskeletal: M/S 5/5 throughout.  Extremities without ischemic changes.  No deformity or atrophy. No edema. Neurologic: Sensation grossly intact in extremities.  Symmetrical.  Speech is fluent. Motor exam as listed above. Psychiatric: Judgment intact, Mood & affect appropriate for pt's clinical situation. Dermatologic: No rashes or ulcers noted.  No cellulitis or open wounds.    Radiology MR BRAIN WO CONTRAST  Result Date: 03/23/2021 CLINICAL DATA:  Initial evaluation for neuro deficit, stroke suspected. EXAM: MRI HEAD WITHOUT CONTRAST TECHNIQUE: Multiplanar, multiecho pulse sequences of the brain and surrounding structures were  obtained without intravenous contrast. COMPARISON:  Prior CTs from earlier the same day. FINDINGS: Brain: Cerebral volume within normal limits for age. Scattered patchy T2/FLAIR hyperintensity involving the periventricular and deep white matter both cerebral hemispheres most consistent with chronic small vessel ischemic disease, mild in nature. Encephalomalacia and gliosis involving the right temporal occipital region consistent with a chronic ischemic infarct. Associated mild chronic hemosiderin staining within this  area. Subtle focus of patchy diffusion abnormality seen involving the subcortical aspect of the left frontal lobe, consistent with a small acute ischemic infarct, left MCA distribution (series 5, image 33). No associated hemorrhage or mass effect. No other diffusion abnormality to suggest acute or subacute ischemia. No other areas of chronic cortical infarction. No other evidence for acute or chronic intracranial hemorrhage. No mass lesion, mass effect or midline shift. No hydrocephalus or extra-axial fluid collection. Pituitary gland suprasellar region normal. Midline structures intact. Vascular: Major intracranial vascular flow voids are maintained. Skull and upper cervical spine: Craniocervical junction within normal limits. Bone marrow signal intensity normal. No scalp soft tissue abnormality. Sinuses/Orbits: Patient status post bilateral ocular lens replacement. Globes and orbital soft tissues demonstrate no acute finding. Mild scattered mucosal thickening noted within the ethmoidal air cells and maxillary sinuses. Paranasal sinuses are otherwise clear. No significant mastoid effusion. Other: None. IMPRESSION: 1. Subtle focus of diffusion abnormality involving the subcortical aspect of the left frontal lobe, consistent with a small acute ischemic infarct, left MCA distribution. No associated hemorrhage or mass effect. 2. No other acute intracranial abnormality. 3. Chronic right temporoccipital  infarct. 4. Underlying mild chronic microvascular ischemic disease. Electronically Signed   By: Jeannine Boga M.D.   On: 03/23/2021 21:43   ECHOCARDIOGRAM COMPLETE  Result Date: 03/24/2021    ECHOCARDIOGRAM REPORT   Patient Name:   ZACKARIE CHASON Date of Exam: 03/24/2021 Medical Rec #:  [id number]     Height:       70.0 in Accession #:    [id number]    Weight:       174.0 lb Date of Birth:  [birth year]     BSA:          1.967 m Patient Age:    9 years      BP:           127/59 mmHg Patient Gender: M             HR:           77 bpm. Exam Location:  [facility] Procedure: 2D Echo and Intracardiac Opacification Agent Indications:     TIA  History:         Patient has no prior history of Echocardiogram examinations.                  CAD; Risk Factors:Hypertension and Diabetes.  Sonographer:     L. Thornton-Maynard Referring Phys:  [place] Diagnosing Phys: Kate Sable MD IMPRESSIONS  1. Left ventricular ejection fraction, by estimation, is 25 to 30%. Left ventricular ejection fraction by 2D MOD biplane is 26.5 %. The left ventricle has severely decreased function. The left ventricle demonstrates global hypokinesis. Left ventricular diastolic parameters are consistent with Grade I diastolic dysfunction (impaired relaxation). There is akinesis of the left ventricular, entire anteroseptal wall.  2. Right ventricular systolic function is normal. The right ventricular size is normal.  3. The mitral valve is normal in structure. No evidence of mitral valve regurgitation.  4. The aortic valve was not well visualized. Aortic valve regurgitation is not visualized. Aortic valve sclerosis/calcification is present, without any evidence of aortic stenosis.  5. The inferior vena cava is normal in size with <50% respiratory variability, suggesting right atrial pressure of 8 mmHg. FINDINGS  Left Ventricle: Left ventricular ejection fraction, by estimation, is 25 to 30%. Left ventricular ejection fraction by 2D  MOD biplane is 26.5 %. The left ventricle has severely decreased  function. The left ventricle demonstrates global hypokinesis. Definity contrast agent was given IV to delineate the left ventricular endocardial borders. The left ventricular internal cavity size was normal in size. There is no left ventricular hypertrophy. Left ventricular diastolic parameters are consistent with Grade I diastolic dysfunction (impaired relaxation). Right Ventricle: The right ventricular size is normal. No increase in right ventricular wall thickness. Right ventricular systolic function is normal. Left Atrium: Left atrial size was normal in size. Right Atrium: Right atrial size was normal in size. Pericardium: There is no evidence of pericardial effusion. Mitral Valve: The mitral valve is normal in structure. No evidence of mitral valve regurgitation. Tricuspid Valve: The tricuspid valve is normal in structure. Tricuspid valve regurgitation is not demonstrated. Aortic Valve: The aortic valve was not well visualized. Aortic valve regurgitation is not visualized. Aortic valve sclerosis/calcification is present, without any evidence of aortic stenosis. Aortic valve peak gradient measures 7.8 mmHg. Pulmonic Valve: The pulmonic valve was not well visualized. Pulmonic valve regurgitation is not visualized. Aorta: The aortic root and ascending aorta are structurally normal, with no evidence of dilitation. Venous: The inferior vena cava is normal in size with less than 50% respiratory variability, suggesting right atrial pressure of 8 mmHg. IAS/Shunts: No atrial level shunt detected by color flow Doppler.  LEFT VENTRICLE PLAX 2D                        Biplane EF (MOD) LVIDd:         5.40 cm         LV Biplane EF:   Left LVIDs:         4.30 cm                          ventricular LV PW:         1.30 cm                          ejection LV IVS:        0.80 cm                          fraction by LVOT diam:     2.00 cm                          2D  MOD LV SV:         40                               biplane is LV SV Index:   20                               26.5 %. LVOT Area:     3.14 cm                                Diastology                                LV e' medial:    6.09 cm/s LV Volumes (MOD)  LV E/e' medial:  9.7 LV vol d, MOD    139.0 ml      LV e' lateral:   5.44 cm/s A2C:                           LV E/e' lateral: 10.9 LV vol d, MOD    138.0 ml A4C: LV vol s, MOD    108.0 ml A2C: LV vol s, MOD    93.7 ml A4C: LV SV MOD A2C:   31.0 ml LV SV MOD A4C:   138.0 ml LV SV MOD BP:    36.6 ml RIGHT VENTRICLE RV S prime:     16.80 cm/s TAPSE (M-mode): 1.7 cm LEFT ATRIUM             Index        RIGHT ATRIUM           Index LA diam:        3.00 cm 1.52 cm/m   RA Area:     14.60 cm LA Vol (A2C):   35.5 ml 18.04 ml/m  RA Volume:   33.90 ml  17.23 ml/m LA Vol (A4C):   45.6 ml 23.18 ml/m LA Biplane Vol: 40.4 ml 20.54 ml/m  AORTIC VALVE                 PULMONIC VALVE AV Area (Vmax): 1.42 cm     PV Vmax:       1.00 m/s AV Vmax:        140.00 cm/s  PV Peak grad:  4.0 mmHg AV Peak Grad:   7.8 mmHg LVOT Vmax:      63.30 cm/s LVOT Vmean:     47.200 cm/s LVOT VTI:       0.127 m  AORTA Ao Root diam: 3.20 cm Ao Asc diam:  3.30 cm MITRAL VALVE MV Area (PHT): 3.65 cm    SHUNTS MV Decel Time: 208 msec    Systemic VTI:  0.13 m MV E velocity: 59.10 cm/s  Systemic Diam: 2.00 cm MV A velocity: 94.70 cm/s MV E/A ratio:  0.62 Kate Sable MD Electronically signed by Kate Sable MD Signature Date/Time: 03/24/2021/3:12:11 PM    Final    CT HEAD CODE STROKE WO CONTRAST  Addendum Date: 03/23/2021   ADDENDUM REPORT: 03/23/2021 18:51 ADDENDUM: The area infarction is in the right temporal and parietal lobe, not left. Findings were discussed with Dr. Rory Percy. Electronically Signed   By: San Morelle M.D.   On: 03/23/2021 18:51   Result Date: 03/23/2021 CLINICAL DATA:  Code stroke. Episode of abnormal speech approximately 45 minutes ago.  Speech symptoms have resolved. Patient's wife states altered mental status persist. EXAM: CT HEAD WITHOUT CONTRAST TECHNIQUE: Contiguous axial images were obtained from the base of the skull through the vertex without intravenous contrast. COMPARISON:  None. FINDINGS: Brain: A left temporal and parietal lobe infarct extends over 6 cm involvement of the superior temporal gyrus and right occipital lobe. No acute hemorrhage is present. Sulci are effaced. Adjacent ventricle within normal limits without evidence for volume loss. Mild generalized white matter disease is present otherwise. Basal ganglia are otherwise intact. The insular ribbon is normal bilaterally. No significant extraaxial fluid collection is present. The craniocervical junction is normal. Upper cervical spine is within normal limits. Marrow signal is unremarkable. Vascular: Atherosclerotic calcifications are present within the cavernous internal carotid arteries. No hyperdense vessel is present. Skull: Calvarium is intact. No focal  lytic or blastic lesions are present. No significant extracranial soft tissue lesion is present. Sinuses/Orbits: Mild mucosal thickening is present within the inferior frontal sinuses bilaterally. The paranasal sinuses and mastoid air cells are otherwise clear. Bilateral lens replacements are noted. Globes and orbits are otherwise unremarkable. Other: ASPECTS ([place] Stroke Program Early CT Score) - Ganglionic level infarction (caudate, lentiform nuclei, internal capsule, insula, M1-M3 cortex): 6/7 - Supraganglionic infarction (M4-M6 cortex): 2/3 Total score (0-10 with 10 being normal): 8/10 IMPRESSION: 1. 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 Infarct appears acute/subacute, likely greater than 1 hour old. Recommend CTA head and neck with contrast and MR head without contrast for further evaluation. 2. No acute hemorrhage. 3. ASPECTS is  8/10. These results were called by telephone at the time of interpretation on 03/23/2021 at 6:32 pm to provider [facility] , who verbally acknowledged these results. Electronically Signed: By: San Morelle M.D. On: 03/23/2021 18:32   CT ANGIO HEAD NECK W WO CM (CODE STROKE)  Result Date: 03/23/2021 CLINICAL DATA:  Word finding difficulty EXAM: CT ANGIOGRAPHY HEAD AND NECK TECHNIQUE: Multidetector CT imaging of the head and neck was performed using the standard protocol during bolus administration of intravenous contrast. Multiplanar CT image reconstructions and MIPs were obtained to evaluate the vascular anatomy. Carotid stenosis measurements (when applicable) are obtained utilizing NASCET criteria, using the distal internal carotid diameter as the denominator. CONTRAST:  22mL OMNIPAQUE IOHEXOL 350 MG/ML SOLN COMPARISON:  No prior CTA. Correlation is made with 03/23/2021 CT head. FINDINGS: CT HEAD FINDINGS For noncontrast findings, please see same day CT head. CTA NECK FINDINGS Aortic arch: Standard branching. Imaged portion shows no evidence of aneurysm or dissection. No significant stenosis of the major arch vessel origins, although calcified and noncalcified plaque does extend into the origins. Right carotid system: Calcified and noncalcified plaque at the bifurcation, which causes less than 50% luminal narrowing. No evidence of dissection, stenosis (50% or greater) or occlusion. Left carotid system: Calcified and noncalcified plaque at the bifurcation and in the proximal left ICA causes 70% luminal narrowing in the proximal left ICA. In addition, there is approximately 30% narrowing of the proximal left CCA. No evidence of dissection or occlusion. Vertebral arteries: Right dominant. No evidence of dissection, stenosis (50% or greater) or occlusion. Skeleton: No acute osseous abnormality. Other neck: Prominent left parotid (series 8, image 162) and left level 2A (series 8, image 209) lymph nodes,  which each measure up to 6 mm in short axis. These are not of abnormal density or morphology and are favored to be reactive. Upper chest: Negative. Review of the MIP images confirms the above findings CTA HEAD FINDINGS Anterior circulation: Calcified and noncalcified plaque in the intracranial internal carotid arteries, left-greater-than-right, which causes moderate luminal narrowing in the left cavernous segment and moderate narrowing in the right supraclinoid segment. Patent right A1. Hypoplastic left A1. Normal anterior communicating artery. Anterior cerebral arteries are patent to their distal aspects. No M1 stenosis or occlusion. Normal MCA bifurcations. Distal MCA branches perfused and symmetric. Posterior circulation: Vertebral arteries patent to the vertebrobasilar junction with minimal calcification but without significant stenosis. Right dominant. Posterior inferior cerebral arteries patent bilaterally. Basilar patent to its distal aspect. Superior cerebellar arteries patent bilaterally. Bilateral P1 segments originate from the basilar artery. Duplicated left PCA with fetal origin from a patent left posterior communicating artery. PCAs perfused to their distal aspects without stenosis. Venous sinuses: Patent.  Anatomic variants: Duplicated left PCA, with additional fetal origin PCA Review of the MIP images confirms the above findings IMPRESSION: 1. 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 No other significant intracranial stenosis. 2. 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 In addition there is approximately 30% narrowing of the proximal left CCA. No other hemodynamically significant stenosis in the neck. Electronically Signed   By: Merilyn Baba M.D.   On: 03/23/2021 19:26    Labs Recent Results (from the past 2160 hour(s))  CBG monitoring, ED     Status: Abnormal   Collection Time: 03/23/21  6:15 PM  Result Value Ref Range   Glucose-Capillary 203 (H) 70 - 99 mg/dL    Comment:  Glucose reference range applies only to samples taken after fasting for at least 8 hours.  Ethanol     Status: None   Collection Time: 03/23/21  6:19 PM  Result Value Ref Range   Alcohol, Ethyl (B) <10 <10 mg/dL    Comment: (NOTE) Lowest detectable limit for serum alcohol is 10 mg/dL.  For medical purposes only. Performed at [facility], [address] [place], [place] [address]   Protime-INR     Status: None   Collection Time: 03/23/21  6:19 PM  Result Value Ref Range   Prothrombin Time 12.6 11.4 - 15.2 seconds   INR 1.0 0.8 - 1.2    Comment: (NOTE) INR goal varies based on device and disease states. Performed at [facility], [address] [place], [place] [address]   APTT     Status: None   Collection Time: 03/23/21  6:19 PM  Result Value Ref Range   aPTT 29 24 - 36 seconds    Comment: Performed at [facility], [address] [place], [place] [address]  CBC     Status: Abnormal   Collection Time: 03/23/21  6:19 PM  Result Value Ref Range   WBC 11.1 (H) 4.0 - 10.5 K/uL   RBC 5.37 4.22 - 5.81 MIL/uL   Hemoglobin 12.8 (L) 13.0 - 17.0 g/dL   HCT 41.2 39.0 - 52.0 %   MCV 76.7 (L) 80.0 - 100.0 fL   MCH 23.8 (L) 26.0 - 34.0 pg   MCHC 31.1 30.0 - 36.0 g/dL   RDW 19.9 (H) 11.5 - 15.5 %   Platelets 273 150 - 400 K/uL   nRBC 0.0 0.0 - 0.2 %    Comment: Performed at [facility], [address] [place], [place] [address]  Differential     Status: Abnormal   Collection Time: 03/23/21  6:19 PM  Result Value Ref Range   Neutrophils Relative % 71 %   Neutro Abs 7.8 (H) 1.7 - 7.7 K/uL   Lymphocytes Relative 20 %   Lymphs Abs 2.3 0.7 - 4.0 K/uL   Monocytes Relative 7 %   Monocytes Absolute 0.7 0.1 - 1.0 K/uL   Eosinophils Relative 2 %   Eosinophils Absolute 0.2 0.0 - 0.5 K/uL   Basophils Relative 0 %   Basophils Absolute 0.1 0.0 - 0.1 K/uL   Immature Granulocytes 0 %   Abs Immature Granulocytes 0.04 0.00 - 0.07 K/uL    Comment:  Performed at [facility], [address] [place], [place] [address]  Comprehensive metabolic panel     Status: Abnormal   Collection Time: 03/23/21  6:19 PM  Result Value Ref Range   Sodium 136 135 - 145 mmol/L   Potassium 4.2 3.5 - 5.1  mmol/L   Chloride 102 98 - 111 mmol/L   CO2 27 22 - 32 mmol/L   Glucose, Bld 194 (H) 70 - 99 mg/dL    Comment: Glucose reference range applies only to samples taken after fasting for at least 8 hours.   BUN 17 8 - 23 mg/dL   Creatinine, Ser 1.10 0.61 - 1.24 mg/dL   Calcium 9.4 8.9 - 10.3 mg/dL   Total Protein 7.8 6.5 - 8.1 g/dL   Albumin 3.8 3.5 - 5.0 g/dL   AST 20 15 - 41 U/L   ALT 13 0 - 44 U/L   Alkaline Phosphatase 64 38 - 126 U/L   Total Bilirubin 0.5 0.3 - 1.2 mg/dL   GFR, Estimated >60 >60 mL/min    Comment: (NOTE) Calculated using the CKD-EPI Creatinine Equation (2021)    Anion gap 7 5 - 15    Comment: Performed at [facility], [address], [place], [place] [address]  Urine Drug Screen, Qualitative     Status: None   Collection Time: 03/23/21  8:00 PM  Result Value Ref Range   Tricyclic, Ur Screen NONE DETECTED NONE DETECTED   Amphetamines, Ur Screen NONE DETECTED NONE DETECTED   MDMA (Ecstasy)Ur Screen NONE DETECTED NONE DETECTED   Cocaine Metabolite,Ur [place] NONE DETECTED NONE DETECTED   Opiate, Ur Screen NONE DETECTED NONE DETECTED   Phencyclidine (PCP) Ur S NONE DETECTED NONE DETECTED   Cannabinoid 50 Ng, Ur [place] NONE DETECTED NONE DETECTED   Barbiturates, Ur Screen NONE DETECTED NONE DETECTED   Benzodiazepine, Ur Scrn NONE DETECTED NONE DETECTED   Methadone Scn, Ur NONE DETECTED NONE DETECTED    Comment: (NOTE) Tricyclics + metabolites, urine    Cutoff 1000 ng/mL Amphetamines + metabolites, urine  Cutoff 1000 ng/mL MDMA (Ecstasy), urine              Cutoff 500 ng/mL Cocaine Metabolite, urine          Cutoff 300 ng/mL Opiate + metabolites, urine        Cutoff 300 ng/mL Phencyclidine (PCP), urine          Cutoff 25 ng/mL Cannabinoid, urine                 Cutoff 50 ng/mL Barbiturates + metabolites, urine  Cutoff 200 ng/mL Benzodiazepine, urine              Cutoff 200 ng/mL Methadone, urine                   Cutoff 300 ng/mL  The urine drug screen provides only a preliminary, unconfirmed analytical test result and should not be used for non-medical purposes. Clinical consideration and professional judgment should be applied to any positive drug screen result due to possible interfering substances. A more specific alternate chemical method must be used in order to obtain a confirmed analytical result. Gas chromatography / mass spectrometry (GC/MS) is the preferred confirm atory method. Performed at [facility], [place],  [id number]   Resp Panel by RT-PCR (Flu A&B, Covid) Nasopharyngeal Swab     Status: None   Collection Time: 03/23/21 11:07 PM   Specimen: Nasopharyngeal Swab; Nasopharyngeal(NP) swabs in vial transport medium  Result Value Ref Range   SARS Coronavirus 2 by RT PCR NEGATIVE NEGATIVE    Comment: (NOTE) SARS-CoV-2 target nucleic acids are NOT DETECTED.  The SARS-CoV-2 RNA is generally detectable in upper respiratory specimens during the acute phase of infection. The  lowest concentration of SARS-CoV-2 viral copies this assay can detect is 138 copies/mL. A negative result does not preclude SARS-Cov-2 infection and should not be used as the sole basis for treatment or other patient management decisions. A negative result may occur with  improper specimen collection/handling, submission of specimen other than nasopharyngeal swab, presence of viral mutation(s) within the areas targeted by this assay, and inadequate number of viral copies(<138 copies/mL). A negative result must be combined with clinical observations, patient history, and epidemiological information. The expected result is Negative.  Fact Sheet for Patients:   EntrepreneurPulse.com.au  Fact Sheet for Healthcare Providers:  IncredibleEmployment.be  This test is no t yet approved or cleared by the [place] FDA and  has been authorized for detection and/or diagnosis of SARS-CoV-2 by FDA under an Emergency Use Authorization (EUA). This EUA will remain  in effect (meaning this test can be used) for the duration of the COVID-19 declaration under Section 564(b)(1) of the Act, 21 U.S.C.section 360bbb-3(b)(1), unless the authorization is terminated  or revoked sooner.       Influenza A by PCR NEGATIVE NEGATIVE   Influenza B by PCR NEGATIVE NEGATIVE    Comment: (NOTE) The Xpert Xpress SARS-CoV-2/FLU/RSV plus assay is intended as an aid in the diagnosis of influenza from Nasopharyngeal swab specimens and should not be used as a sole basis for treatment. Nasal washings and aspirates are unacceptable for Xpert Xpress SARS-CoV-2/FLU/RSV testing.  Fact Sheet for Patients: EntrepreneurPulse.com.au  Fact Sheet for Healthcare Providers: IncredibleEmployment.be  This test is not yet approved or cleared by the [place] FDA and has been authorized for detection and/or diagnosis of SARS-CoV-2 by FDA under an Emergency Use Authorization (EUA). This EUA will remain in effect (meaning this test can be used) for the duration of the COVID-19 declaration under Section 564(b)(1) of the Act, 21 U.S.C. section 360bbb-3(b)(1), unless the authorization is terminated or revoked.  Performed at [facility], [address] [place], [place] [address]   Hemoglobin A1c     Status: Abnormal   Collection Time: 03/24/21  4:31 AM  Result Value Ref Range   Hgb A1c MFr Bld 7.2 (H) 4.8 - 5.6 %    Comment: (NOTE) Pre diabetes:          5.7%-6.4%  Diabetes:              >6.4%  Glycemic control for   <7.0% adults with diabetes    Mean Plasma Glucose 159.94 mg/dL    Comment:  Performed at [facility] [address], [place],  [address]  Lipid panel     Status: Abnormal   Collection Time: 03/24/21  4:31 AM  Result Value Ref Range   Cholesterol 146 0 - 200 mg/dL   Triglycerides 157 (H) <150 mg/dL   HDL 33 (L) >40 mg/dL   Total CHOL/HDL Ratio 4.4 RATIO   VLDL 31 0 - 40 mg/dL   LDL Cholesterol 82 0 - 99 mg/dL    Comment:        Total Cholesterol/HDL:CHD Risk Coronary Heart Disease Risk Table                     Men   Women  1/2 Average Risk   3.4   3.3  Average Risk       5.0   4.4  2 X Average Risk   9.6   7.1  3 X Average Risk  23.4   11.0  Use the calculated Patient Ratio above and the CHD Risk Table to determine the patient's CHD Risk.        ATP III CLASSIFICATION (LDL):  <100     mg/dL   Optimal  100-129  mg/dL   Near or Above                    Optimal  130-159  mg/dL   Borderline  160-189  mg/dL   High  >190     mg/dL   Very High Performed at [facility], [address] [place], [place] [address]   CBG monitoring, ED     Status: Abnormal   Collection Time: 03/24/21  7:33 AM  Result Value Ref Range   Glucose-Capillary 155 (H) 70 - 99 mg/dL    Comment: Glucose reference range applies only to samples taken after fasting for at least 8 hours.  CBG monitoring, ED     Status: Abnormal   Collection Time: 03/24/21 12:11 PM  Result Value Ref Range   Glucose-Capillary 198 (H) 70 - 99 mg/dL    Comment: Glucose reference range applies only to samples taken after fasting for at least 8 hours.  ECHOCARDIOGRAM COMPLETE     Status: None   Collection Time: 03/24/21 12:42 PM  Result Value Ref Range   Weight 2,784 oz   Height 70 in   BP 132/69 mmHg   Ao pk vel 1.40 m/s   AR max vel 1.42 cm2   AV Peak grad 7.8 mmHg   Single Plane A2C EF 22.3 %   Single Plane A4C EF 32.1 %   Calc EF 26.5 %   S' Lateral 4.30 cm   Area-P 1/2 3.65 cm2    Assessment/Plan:  Carotid stenosis, symptomatic, with infarction [facility]) The  patient has a high-grade left carotid artery stenosis officially reported on the CT angiogram of 70% but appearing significantly worse than that to me.  This caused a stroke a little over a week ago.  This should be intervened upon to reduce stroke risk reduction.  In evaluating his CT angiogram, the lesion tracks fairly high.  This may not entirely preclude surgery, but I do believe it would make it fairly difficult.  For this reason, I think proceeding with carotid artery stenting would be in his best interest.  He is on Plavix already.  I discussed the risks and benefits of carotid artery stenting.  The patient voices understanding and is agreeable to proceed.  Essential hypertension blood pressure control important in reducing the progression of atherosclerotic disease. On appropriate oral medications.   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  Plan intervention  Diabetes mellitus ([place]) blood glucose control important in reducing the progression of atherosclerotic disease. Also, involved in wound healing. On appropriate medications.   HLD (hyperlipidemia) lipid control important in reducing the progression of atherosclerotic disease. Continue statin therapy      [name] 04/03/2021, 2:12 PM   This note was created with Dragon medical transcription system.  Any errors from dictation are unintentional.

## 2021-04-03 NOTE — Assessment & Plan Note (Signed)
Small stroke from carotid disease.  Plan intervention

## 2021-04-03 NOTE — Assessment & Plan Note (Addendum)
The patient has a high-grade left carotid artery stenosis officially reported on the CT angiogram of 70% but appearing significantly worse than that to me.  This caused a stroke a little over a week ago.  This should be intervened upon to reduce stroke risk reduction.  In evaluating his CT angiogram, the lesion tracks fairly high.  This may not entirely preclude surgery, but I do believe it would make it fairly difficult.  For this reason, I think proceeding with carotid artery stenting would be in his best interest.  He is on Plavix already.  I discussed the risks and benefits of carotid artery stenting.  The patient voices understanding and is agreeable to proceed.

## 2021-04-03 NOTE — Assessment & Plan Note (Signed)
blood glucose control important in reducing the progression of atherosclerotic disease. Also, involved in wound healing. On appropriate medications.  

## 2021-04-03 NOTE — Assessment & Plan Note (Signed)
blood pressure control important in reducing the progression of atherosclerotic disease. On appropriate oral medications.  

## 2021-04-03 NOTE — Telephone Encounter (Signed)
Spoke with the patient and he is scheduled with Dr. Lucky Cowboy for a left carotid stent placement on 04/05/21 with a 8:45 am arrival time to the MM. Pre-procedure instructions were discussed and patient stated he will come by to pick up.

## 2021-04-04 ENCOUNTER — Other Ambulatory Visit
Admission: RE | Admit: 2021-04-04 | Discharge: 2021-04-04 | Disposition: A | Discharge status: Home / Self Care | Payer: Medicare Other | Source: Ambulatory Visit | Attending: Vascular Surgery | Admitting: Vascular Surgery

## 2021-04-04 DIAGNOSIS — Z01812 Encounter for preprocedural laboratory examination: Secondary | ICD-10-CM | POA: Insufficient documentation

## 2021-04-04 DIAGNOSIS — Z20822 Contact with and (suspected) exposure to covid-19: Secondary | ICD-10-CM

## 2021-04-04 LAB — SARS CORONAVIRUS 2 (TAT 6-24 HRS): SARS Coronavirus 2: NEGATIVE

## 2021-04-05 ENCOUNTER — Inpatient Hospital Stay
Admission: RE | Admit: 2021-04-05 | Discharge: 2021-04-06 | DRG: 036 | Disposition: A | Discharge status: Home / Self Care | Payer: Medicare Other | Attending: Vascular Surgery | Admitting: Vascular Surgery

## 2021-04-05 ENCOUNTER — Encounter: Payer: Self-pay | Admitting: Vascular Surgery

## 2021-04-05 ENCOUNTER — Other Ambulatory Visit: Payer: Self-pay

## 2021-04-05 ENCOUNTER — Other Ambulatory Visit (INDEPENDENT_AMBULATORY_CARE_PROVIDER_SITE_OTHER): Payer: Self-pay | Admitting: Nurse Practitioner

## 2021-04-05 ENCOUNTER — Encounter
Admission: RE | Disposition: A | Discharge status: Home / Self Care | Payer: Self-pay | Source: Home / Self Care | Attending: Vascular Surgery

## 2021-04-05 DIAGNOSIS — I63239 Cerebral infarction due to unspecified occlusion or stenosis of unspecified carotid arteries: Secondary | ICD-10-CM

## 2021-04-05 DIAGNOSIS — Z7902 Long term (current) use of antithrombotics/antiplatelets: Secondary | ICD-10-CM | POA: Diagnosis not present

## 2021-04-05 DIAGNOSIS — Z20822 Contact with and (suspected) exposure to covid-19: Secondary | ICD-10-CM | POA: Diagnosis present

## 2021-04-05 DIAGNOSIS — Z7984 Long term (current) use of oral hypoglycemic drugs: Secondary | ICD-10-CM

## 2021-04-05 DIAGNOSIS — E785 Hyperlipidemia, unspecified: Secondary | ICD-10-CM | POA: Diagnosis present

## 2021-04-05 DIAGNOSIS — Z7982 Long term (current) use of aspirin: Secondary | ICD-10-CM

## 2021-04-05 DIAGNOSIS — I6523 Occlusion and stenosis of bilateral carotid arteries: Secondary | ICD-10-CM | POA: Diagnosis present

## 2021-04-05 DIAGNOSIS — Z79899 Other long term (current) drug therapy: Secondary | ICD-10-CM

## 2021-04-05 DIAGNOSIS — Z8673 Personal history of transient ischemic attack (TIA), and cerebral infarction without residual deficits: Secondary | ICD-10-CM

## 2021-04-05 DIAGNOSIS — Z9861 Coronary angioplasty status: Secondary | ICD-10-CM

## 2021-04-05 DIAGNOSIS — E119 Type 2 diabetes mellitus without complications: Secondary | ICD-10-CM | POA: Diagnosis present

## 2021-04-05 DIAGNOSIS — Z881 Allergy status to other antibiotic agents status: Secondary | ICD-10-CM | POA: Diagnosis not present

## 2021-04-05 DIAGNOSIS — I6522 Occlusion and stenosis of left carotid artery: Secondary | ICD-10-CM

## 2021-04-05 DIAGNOSIS — F17290 Nicotine dependence, other tobacco product, uncomplicated: Secondary | ICD-10-CM | POA: Diagnosis present

## 2021-04-05 DIAGNOSIS — I10 Essential (primary) hypertension: Secondary | ICD-10-CM | POA: Diagnosis present

## 2021-04-05 DIAGNOSIS — Z006 Encounter for examination for normal comparison and control in clinical research program: Secondary | ICD-10-CM

## 2021-04-05 DIAGNOSIS — F17220 Nicotine dependence, chewing tobacco, uncomplicated: Secondary | ICD-10-CM | POA: Diagnosis present

## 2021-04-05 HISTORY — PX: CAROTID PTA/STENT INTERVENTION: CATH118231

## 2021-04-05 LAB — GLUCOSE, CAPILLARY
Glucose-Capillary: 107 mg/dL — ABNORMAL HIGH (ref 70–99)
Glucose-Capillary: 140 mg/dL — ABNORMAL HIGH (ref 70–99)

## 2021-04-05 LAB — MRSA NEXT GEN BY PCR, NASAL: MRSA by PCR Next Gen: NOT DETECTED

## 2021-04-05 LAB — BUN: BUN: 14 mg/dL (ref 8–23)

## 2021-04-05 LAB — CREATININE, SERUM
Creatinine, Ser: 1.12 mg/dL (ref 0.61–1.24)
GFR, Estimated: >60 mL/min (ref >60)

## 2021-04-05 LAB — POCT ACTIVATED CLOTTING TIME: Activated Clotting Time: 227 seconds

## 2021-04-05 SURGERY — CAROTID PTA/STENT INTERVENTION
Anesthesia: Moderate Sedation | Laterality: Left

## 2021-04-05 MED ORDER — ASPIRIN EC 81 MG PO TBEC
81.0000 mg | DELAYED_RELEASE_TABLET | Freq: Every day | ORAL | Status: DC
  Administered 2021-04-05: 81 mg via ORAL
  Filled 2021-04-05: qty 1

## 2021-04-05 MED ORDER — MIDAZOLAM HCL 2 MG/2ML IJ SOLN
INTRAMUSCULAR | Status: DC | PRN
  Administered 2021-04-05: 1 mg via INTRAVENOUS

## 2021-04-05 MED ORDER — CEFAZOLIN SODIUM-DEXTROSE 2-4 GM/100ML-% IV SOLN: 2.0000 g | Freq: Once | INTRAVENOUS | Status: DC

## 2021-04-05 MED ORDER — SODIUM CHLORIDE 0.9 % IV SOLN: 500.0000 mL | Freq: Once | INTRAVENOUS | Status: DC | PRN

## 2021-04-05 MED ORDER — HEPARIN SODIUM (PORCINE) 1000 UNIT/ML IJ SOLN
INTRAMUSCULAR | Status: AC
  Filled 2021-04-05: qty 10

## 2021-04-05 MED ORDER — CALCIUM CARBONATE 1250 (500 CA) MG PO TABS
1.0000 | ORAL_TABLET | Freq: Three times a day (TID) | ORAL | Status: DC
  Administered 2021-04-05: 500 mg via ORAL
  Filled 2021-04-05 (×5): qty 1

## 2021-04-05 MED ORDER — HYDROMORPHONE HCL 1 MG/ML IJ SOLN: 1.0000 mg | Freq: Once | INTRAMUSCULAR | Status: DC | PRN

## 2021-04-05 MED ORDER — LOSARTAN POTASSIUM 25 MG PO TABS
25.0000 mg | ORAL_TABLET | Freq: Every day | ORAL | Status: DC
Start: 2021-04-05 — End: 2021-04-06
  Filled 2021-04-05: qty 1

## 2021-04-05 MED ORDER — ATORVASTATIN CALCIUM 20 MG PO TABS
40.0000 mg | ORAL_TABLET | Freq: Every day | ORAL | Status: DC
  Administered 2021-04-05: 40 mg via ORAL
  Filled 2021-04-05: qty 2

## 2021-04-05 MED ORDER — SODIUM CHLORIDE 0.9 % IV SOLN: INTRAVENOUS | Status: DC

## 2021-04-05 MED ORDER — PHENYLEPHRINE 40 MCG/ML (10ML) SYRINGE FOR IV PUSH (FOR BLOOD PRESSURE SUPPORT)
PREFILLED_SYRINGE | INTRAVENOUS | Status: AC
  Filled 2021-04-05: qty 10

## 2021-04-05 MED ORDER — GUAIFENESIN-DM 100-10 MG/5ML PO SYRP
15.0000 mL | ORAL_SOLUTION | ORAL | Status: DC | PRN
  Filled 2021-04-05: qty 15

## 2021-04-05 MED ORDER — FAMOTIDINE 20 MG PO TABS: 40.0000 mg | ORAL_TABLET | Freq: Once | ORAL | Status: DC | PRN

## 2021-04-05 MED ORDER — DOPAMINE-DEXTROSE 3.2-5 MG/ML-% IV SOLN
INTRAVENOUS | Status: AC
  Filled 2021-04-05: qty 250

## 2021-04-05 MED ORDER — METHYLPREDNISOLONE SODIUM SUCC 125 MG IJ SOLR: 125.0000 mg | Freq: Once | INTRAMUSCULAR | Status: DC | PRN

## 2021-04-05 MED ORDER — MIDAZOLAM HCL 5 MG/5ML IJ SOLN
INTRAMUSCULAR | Status: AC
  Filled 2021-04-05: qty 5

## 2021-04-05 MED ORDER — FAMOTIDINE IN NACL 20-0.9 MG/50ML-% IV SOLN
20.0000 mg | Freq: Two times a day (BID) | INTRAVENOUS | Status: DC
  Administered 2021-04-05: 20 mg via INTRAVENOUS
  Filled 2021-04-05: qty 50

## 2021-04-05 MED ORDER — ATROPINE SULFATE 1 MG/10ML IJ SOSY
PREFILLED_SYRINGE | INTRAMUSCULAR | Status: AC
  Filled 2021-04-05: qty 10

## 2021-04-05 MED ORDER — ACETAMINOPHEN 325 MG PO TABS: 325.0000 mg | ORAL_TABLET | ORAL | Status: DC | PRN

## 2021-04-05 MED ORDER — PANTOPRAZOLE SODIUM 40 MG PO TBEC: 40.0000 mg | DELAYED_RELEASE_TABLET | Freq: Every day | ORAL | Status: DC

## 2021-04-05 MED ORDER — ATROPINE SULFATE 1 MG/10ML IJ SOSY
PREFILLED_SYRINGE | INTRAMUSCULAR | Status: DC | PRN
  Administered 2021-04-05: 1 mg via INTRAVENOUS

## 2021-04-05 MED ORDER — CLOPIDOGREL BISULFATE 75 MG PO TABS: 75.0000 mg | ORAL_TABLET | Freq: Every day | ORAL | Status: DC

## 2021-04-05 MED ORDER — MAGNESIUM OXIDE 400 MG PO TABS
400.0000 mg | ORAL_TABLET | Freq: Two times a day (BID) | ORAL | Status: DC
  Administered 2021-04-05: 400 mg via ORAL
  Filled 2021-04-05 (×4): qty 1

## 2021-04-05 MED ORDER — TACROLIMUS 0.5 MG PO CAPS
0.5000 mg | ORAL_CAPSULE | Freq: Two times a day (BID) | ORAL | Status: DC
  Administered 2021-04-05: 0.5 mg via ORAL
  Filled 2021-04-05 (×3): qty 1

## 2021-04-05 MED ORDER — PHENYLEPHRINE HCL-NACL 20-0.9 MG/250ML-% IV SOLN
INTRAVENOUS | Status: AC
  Filled 2021-04-05: qty 250

## 2021-04-05 MED ORDER — CALCIUM 600+D3 PLUS MINERALS 600-800 MG-UNIT PO TABS: 1.0000 | ORAL_TABLET | Freq: Three times a day (TID) | ORAL | Status: DC

## 2021-04-05 MED ORDER — OXYCODONE-ACETAMINOPHEN 5-325 MG PO TABS: 1.0000 | ORAL_TABLET | ORAL | Status: DC | PRN

## 2021-04-05 MED ORDER — METOPROLOL TARTRATE 5 MG/5ML IV SOLN: 2.0000 mg | INTRAVENOUS | Status: DC | PRN

## 2021-04-05 MED ORDER — LABETALOL HCL 5 MG/ML IV SOLN: 10.0000 mg | INTRAVENOUS | Status: DC | PRN

## 2021-04-05 MED ORDER — MORPHINE SULFATE (PF) 4 MG/ML IV SOLN: 2.0000 mg | INTRAVENOUS | Status: DC | PRN

## 2021-04-05 MED ORDER — DIPHENHYDRAMINE HCL 50 MG/ML IJ SOLN: 50.0000 mg | Freq: Once | INTRAMUSCULAR | Status: DC | PRN

## 2021-04-05 MED ORDER — MIDAZOLAM HCL 2 MG/ML PO SYRP: 8.0000 mg | ORAL_SOLUTION | Freq: Once | ORAL | Status: DC | PRN

## 2021-04-05 MED ORDER — CHLORHEXIDINE GLUCONATE CLOTH 2 % EX PADS
6.0000 | MEDICATED_PAD | Freq: Every day | CUTANEOUS | Status: DC
  Administered 2021-04-05: 6 via TOPICAL

## 2021-04-05 MED ORDER — FENTANYL CITRATE PF 50 MCG/ML IJ SOSY
PREFILLED_SYRINGE | INTRAMUSCULAR | Status: AC
  Filled 2021-04-05: qty 1

## 2021-04-05 MED ORDER — GLIPIZIDE 5 MG PO TABS
5.0000 mg | ORAL_TABLET | Freq: Two times a day (BID) | ORAL | Status: DC
  Administered 2021-04-05: 5 mg via ORAL
  Filled 2021-04-05 (×3): qty 1

## 2021-04-05 MED ORDER — DAPAGLIFLOZIN PROPANEDIOL 5 MG PO TABS
5.0000 mg | ORAL_TABLET | Freq: Every day | ORAL | Status: DC
  Filled 2021-04-05: qty 1

## 2021-04-05 MED ORDER — PHENOL 1.4 % MT LIQD
1.0000 | OROMUCOSAL | Status: DC | PRN
  Filled 2021-04-05: qty 177

## 2021-04-05 MED ORDER — ACETAMINOPHEN 325 MG RE SUPP
325.0000 mg | RECTAL | Status: DC | PRN
  Filled 2021-04-05: qty 2

## 2021-04-05 MED ORDER — IODIXANOL 320 MG/ML IV SOLN
INTRAVENOUS | Status: DC | PRN
  Administered 2021-04-05: 45 mL

## 2021-04-05 MED ORDER — METOPROLOL SUCCINATE ER 50 MG PO TB24: 25.0000 mg | ORAL_TABLET | Freq: Every day | ORAL | Status: DC

## 2021-04-05 MED ORDER — MAGNESIUM SULFATE 2 GM/50ML IV SOLN
2.0000 g | Freq: Every day | INTRAVENOUS | Status: DC | PRN
  Filled 2021-04-05: qty 50

## 2021-04-05 MED ORDER — ONDANSETRON HCL 4 MG/2ML IJ SOLN: 4.0000 mg | Freq: Four times a day (QID) | INTRAMUSCULAR | Status: DC | PRN

## 2021-04-05 MED ORDER — CEFAZOLIN SODIUM-DEXTROSE 2-4 GM/100ML-% IV SOLN
INTRAVENOUS | Status: AC
  Administered 2021-04-05: 2 g via INTRAVENOUS
  Filled 2021-04-05: qty 100

## 2021-04-05 MED ORDER — VITAMIN D 25 MCG (1000 UNIT) PO TABS
1000.0000 [IU] | ORAL_TABLET | Freq: Three times a day (TID) | ORAL | Status: DC
  Administered 2021-04-05: 1000 [IU] via ORAL
  Filled 2021-04-05: qty 1

## 2021-04-05 MED ORDER — HEPARIN SODIUM (PORCINE) 1000 UNIT/ML IJ SOLN
INTRAMUSCULAR | Status: DC | PRN
  Administered 2021-04-05: 7000 [IU] via INTRAVENOUS

## 2021-04-05 MED ORDER — POTASSIUM CHLORIDE CRYS ER 20 MEQ PO TBCR: 20.0000 meq | EXTENDED_RELEASE_TABLET | Freq: Every day | ORAL | Status: DC | PRN

## 2021-04-05 MED ORDER — FENTANYL CITRATE (PF) 100 MCG/2ML IJ SOLN
INTRAMUSCULAR | Status: DC | PRN
  Administered 2021-04-05: 50 ug via INTRAVENOUS

## 2021-04-05 MED ORDER — SODIUM CHLORIDE 0.9 % IV SOLN
INTRAVENOUS | Status: DC
  Administered 2021-04-05: 1000 mL via INTRAVENOUS

## 2021-04-05 MED ORDER — CEFAZOLIN SODIUM-DEXTROSE 2-4 GM/100ML-% IV SOLN
2.0000 g | Freq: Three times a day (TID) | INTRAVENOUS | Status: AC
  Administered 2021-04-05 – 2021-04-06 (×2): 2 g via INTRAVENOUS
  Filled 2021-04-05 (×2): qty 100

## 2021-04-05 MED ORDER — ALUM & MAG HYDROXIDE-SIMETH 200-200-20 MG/5ML PO SUSP: 15.0000 mL | ORAL | Status: DC | PRN

## 2021-04-05 MED ORDER — HYDRALAZINE HCL 20 MG/ML IJ SOLN: 5.0000 mg | INTRAMUSCULAR | Status: DC | PRN

## 2021-04-05 SURGICAL SUPPLY — 24 items
BALLN VTRAC 4.5X30X135 (BALLOONS) ×2
BALLOON VTRAC 4.5X30X135 (BALLOONS) IMPLANT
CANNULA 5F STIFF (CANNULA) ×1 IMPLANT
CATH ANGIO 5F PIGTAIL 100CM (CATHETERS) ×1 IMPLANT
CATH BEACON 5 .035 100 H1 TIP (CATHETERS) ×1 IMPLANT
COVER DRAPE FLUORO 36X44 (DRAPES) ×1 IMPLANT
COVER PROBE U/S 5X48 (MISCELLANEOUS) ×1 IMPLANT
DEVICE EMBOSHIELD NAV6 4.0-7.0 (FILTER) ×1 IMPLANT
DEVICE SAFEGUARD 24CM (GAUZE/BANDAGES/DRESSINGS) ×1 IMPLANT
DEVICE STARCLOSE SE CLOSURE (Vascular Products) ×1 IMPLANT
DEVICE TORQUE (MISCELLANEOUS) ×1 IMPLANT
GAUZE SPONGE 4X4 12PLY STRL (GAUZE/BANDAGES/DRESSINGS) ×2 IMPLANT
GLIDEWIRE ANGLED SS 035X260CM (WIRE) ×1 IMPLANT
GUIDEWIRE VASC STIFF .038X260 (WIRE) ×1 IMPLANT
IV NS 1000ML (IV SOLUTION) ×2
IV NS 1000ML BAXH (IV SOLUTION) IMPLANT
KIT CAROTID MANIFOLD (MISCELLANEOUS) ×1 IMPLANT
KIT ENCORE 26 ADVANTAGE (KITS) ×1 IMPLANT
PACK ANGIOGRAPHY (CUSTOM PROCEDURE TRAY) ×2 IMPLANT
SHEATH BRITE TIP 6FRX11 (SHEATH) ×1 IMPLANT
SHEATH SHUTTLE SELECT 6F (SHEATH) ×1 IMPLANT
STENT XACT CAR 9-7X40X136 (Permanent Stent) ×1 IMPLANT
TOWEL OR 17X26 4PK STRL BLUE (TOWEL DISPOSABLE) ×1 IMPLANT
WIRE GUIDERIGHT .035X150 (WIRE) ×1 IMPLANT

## 2021-04-05 NOTE — Op Note (Signed)
OPERATIVE NOTE DATE: 04/05/2021  PROCEDURE:  Ultrasound guidance for vascular access right femoral artery  Placement of a 9 mm proximal, 7 mm distal, 4 cm long exact stent with the use of the NAV-6 embolic protection device in the left carotid artery  PRE-OPERATIVE DIAGNOSIS: 1.  High-grade left carotid artery stenosis. 2.  Recent small left-sided stroke  POST-OPERATIVE DIAGNOSIS:  Same as above  SURGEON: [name], MD  ASSISTANT(S): None  ANESTHESIA: local/MCS  ESTIMATED BLOOD LOSS: 15 cc  CONTRAST: 45 cc  FLUORO TIME: 4 minutes  MODERATE CONSCIOUS SEDATION TIME:  Approximately 32 minutes using 1 mg of Versed and 50 mcg of Fentanyl  FINDING(S): 1.   85 to 90% left carotid artery stenosis  SPECIMEN(S):   none  INDICATIONS:   Patient is a 76 y.o. male who presents with a recent stroke and high-grade left carotid artery stenosis.  The patient has a relatively high lesion tracking up to C2 and carotid artery stenting was felt to be preferred to endarterectomy for that reason.  Risks and benefits were discussed and informed consent was obtained.   DESCRIPTION: After obtaining full informed written consent, the patient was brought back to the vascular suite and placed supine upon the table.  The patient received IV antibiotics prior to induction. Moderate conscious sedation was administered during a face to face encounter with the patient throughout the procedure with my supervision of the RN administering medicines and monitoring the patients vital signs and mental status throughout from the start of the procedure until the patient was taken to the recovery room.  After obtaining adequate anesthesia, the patient was prepped and draped in the standard fashion.   The right femoral artery was visualized with ultrasound and found to be widely patent. It was then accessed under direct ultrasound guidance without difficulty with a Seldinger needle. A permanent image was recorded. A  J-wire was placed and we then placed a 6 French sheath. The patient was then heparinized and a total of 7000 units of intravenous heparin were given and an ACT was checked. A pigtail catheter was then placed into the ascending aorta. This showed a type II aortic arch with relatively normal origins of the great vessels. I then selectively cannulated the left common carotid artery without difficulty with a headhunter catheter and advanced into the mid left common carotid artery.  Cervical and cerebral carotid angiography was then performed. There were no obvious intracranial filling defects with sluggish flow in the middle cerebral artery. The carotid bifurcation demonstrated an 85 to 90% ulcerated left carotid artery stenosis which was a fairly long lesion tracking at least 2 cm into the internal carotid artery.  I then advanced into the external carotid artery with a Glidewire and the headhunter catheter and then exchanged for the Amplatz Super Stiff wire. Over the Amplatz Super Stiff wire, a 6 [place] shuttle sheath was placed into the mid common carotid artery. I then used the NAV-6  Embolic protection device and crossed the lesion and parked this in the distal internal carotid artery at the base of the skull.  I then selected a 9 mm proximal, 7 mm distal 4 cm long exact stent. This was deployed across the lesion encompassing it in its entirety. A 4.5 mm diameter by 3 cm length balloon was used to post dilate the stent. Only about a 20% residual stenosis was present after angioplasty. Completion angiogram showed normal intracranial filling without new defects. At this point I elected to terminate  the procedure. The sheath was removed and StarClose closure device was deployed in the right femoral artery with excellent hemostatic result. The patient was taken to the recovery room in stable condition having tolerated the procedure well.  COMPLICATIONS: none  CONDITION: stable  [name] 04/05/2021 10:36  AM   This note was created with Dragon Medical transcription system. Any errors in dictation are purely unintentional.

## 2021-04-05 NOTE — Interval H&P Note (Signed)
History and Physical Interval Note:  04/05/2021 9:32 AM  Dean King  has presented today for surgery, with the diagnosis of LT Carotid Stent Placement  ABBOTT   Cartotid artery stenosis.  The various methods of treatment have been discussed with the patient and family. After consideration of risks, benefits and other options for treatment, the patient has consented to  Procedure(s): CAROTID PTA/STENT INTERVENTION (Left) as a surgical intervention.  The patient's history has been reviewed, patient examined, no change in status, stable for surgery.  I have reviewed the patient's chart and labs.  Questions were answered to the patient's satisfaction.     [name]

## 2021-04-05 NOTE — Plan of Care (Signed)
Patient arrived from [facility] P[facility] to R femoral with some old drainage.  Alert and oriented wife at bedside.

## 2021-04-06 LAB — CBC
HCT: 34.2 % — ABNORMAL LOW (ref 39.0–52.0)
Hemoglobin: 10.9 g/dL — ABNORMAL LOW (ref 13.0–17.0)
MCH: 24.2 pg — ABNORMAL LOW (ref 26.0–34.0)
MCHC: 31.9 g/dL (ref 30.0–36.0)
MCV: 75.8 fL — ABNORMAL LOW (ref 80.0–100.0)
Platelets: 248 10*3/uL (ref 150–400)
RBC: 4.51 MIL/uL (ref 4.22–5.81)
RDW: 19.3 % — ABNORMAL HIGH (ref 11.5–15.5)
WBC: 9.2 10*3/uL (ref 4.0–10.5)
nRBC: 0 % (ref 0.0–0.2)

## 2021-04-06 LAB — BASIC METABOLIC PANEL
Anion gap: 6 (ref 5–15)
BUN: 13 mg/dL (ref 8–23)
CO2: 25 mmol/L (ref 22–32)
Calcium: 8.7 mg/dL — ABNORMAL LOW (ref 8.9–10.3)
Chloride: 106 mmol/L (ref 98–111)
Creatinine, Ser: 1.03 mg/dL (ref 0.61–1.24)
GFR, Estimated: >60 mL/min (ref >60)
Glucose, Bld: 174 mg/dL — ABNORMAL HIGH (ref 70–99)
Potassium: 3.9 mmol/L (ref 3.5–5.1)
Sodium: 137 mmol/L (ref 135–145)

## 2021-04-06 NOTE — Discharge Summary (Signed)
[facility]    Discharge Summary    Patient ID:  Dean King MRN: [id number] DOB/AGE: 1945-04-21 76 y.o.  Admit date: 04/05/2021 Discharge date: 04/06/2021 Date of Surgery: 04/05/2021 Surgeon: Surgeon(s): Algernon Huxley, MD  Admission Diagnosis: Carotid stenosis, symptomatic, with infarction [facility]) [I63.239]  Discharge Diagnoses:  Carotid stenosis, symptomatic, with infarction [facility]) [I63.239]  Secondary Diagnoses: Past Medical History:  Diagnosis Date   Diabetes mellitus without complication ([place])    Hypertension     Procedure(s): CAROTID PTA/STENT INTERVENTION  Discharged Condition: good  HPI:  Patient presents with symptomatic carotid stenosis on the left  Hospital Course:  Dean King is a 76 y.o. male is S/P Left Procedure(s): CAROTID PTA/STENT INTERVENTION Extubated: NA Physical exam: normal neuro exam, VSS Post-op wounds healing well Pt. Ambulating, voiding and taking PO diet without difficulty. Pt pain controlled with PO pain meds. Labs as below Complications:none  Consults:    Significant Diagnostic Studies: CBC Lab Results  Component Value Date   WBC 9.2 04/06/2021   HGB 10.9 (L) 04/06/2021   HCT 34.2 (L) 04/06/2021   MCV 75.8 (L) 04/06/2021   PLT 248 04/06/2021    BMET    Component Value Date/Time   NA 137 04/06/2021 0457   K 3.9 04/06/2021 0457   CL 106 04/06/2021 0457   CO2 25 04/06/2021 0457   GLUCOSE 174 (H) 04/06/2021 0457   BUN 13 04/06/2021 0457   CREATININE 1.03 04/06/2021 0457   CALCIUM 8.7 (L) 04/06/2021 0457   GFRNONAA >60 04/06/2021 0457   COAG Lab Results  Component Value Date   INR 1.0 03/23/2021     Disposition:  Discharge to :Home Discharge Instructions     Call MD for:  redness, tenderness, or signs of infection (pain, swelling, bleeding, redness, odor or green/yellow discharge around incision site)   Complete by: As directed    Call MD for:  severe or increased pain, loss or  decreased feeling  in affected limb(s)   Complete by: As directed    Call MD for:  temperature >100.5   Complete by: As directed    Driving Restrictions   Complete by: As directed    No driving for 24 hours   Lifting restrictions   Complete by: As directed    No lifting for 24 hours   No dressing needed   Complete by: As directed    Replace only if drainage present   Resume previous diet   Complete by: As directed       Allergies as of 04/06/2021       Reactions   Levaquin [levofloxacin]         Medication List     TAKE these medications    aspirin EC 81 MG tablet Take 81 mg by mouth at bedtime.   atorvastatin 40 MG tablet Commonly known as: LIPITOR Take 1 tablet (40 mg total) by mouth daily.   Calcium 600+D3 Plus Minerals 600-800 MG-UNIT Tabs Take 1 tablet by mouth 3 (three) times daily.   clopidogrel 75 MG tablet Commonly known as: PLAVIX Take 75 mg by mouth daily.   dapagliflozin propanediol 10 MG Tabs tablet Commonly known as: FARXIGA Take by mouth daily.   glipiZIDE 5 MG tablet Commonly known as: GLUCOTROL Take 5 mg by mouth 2 (two) times daily before a meal.   losartan 25 MG tablet Commonly known as: Cozaar Take 1 tablet (25 mg total) by mouth daily.   magnesium oxide 400 MG  tablet Commonly known as: MAG-OX Take 400 mg by mouth 2 (two) times daily.   metFORMIN 500 MG tablet Commonly known as: GLUCOPHAGE Take 1,000 mg by mouth 2 (two) times daily.   metoprolol succinate 25 MG 24 hr tablet Commonly known as: Toprol XL Take 1 tablet (25 mg total) by mouth daily.   omeprazole 20 MG capsule Commonly known as: PRILOSEC Take 20 mg by mouth at bedtime.   Prograf 0.5 MG capsule Generic drug: tacrolimus Take 0.5 mg by mouth 2 (two) times daily.       Verbal and written Discharge instructions given to the patient. Wound care per Discharge AVS  Follow-up Information     Kris Hartmann, NP Follow up in 4 week(s).   Specialty: Vascular  Surgery Why: with carotid duplex Contact information: [place] [address] [phone]                 Signed: [name], MD  04/06/2021, 8:24 AM

## 2021-04-24 DIAGNOSIS — I4891 Unspecified atrial fibrillation: Secondary | ICD-10-CM | POA: Insufficient documentation

## 2021-04-28 ENCOUNTER — Encounter (INDEPENDENT_AMBULATORY_CARE_PROVIDER_SITE_OTHER): Payer: Self-pay | Admitting: Vascular Surgery

## 2021-05-09 ENCOUNTER — Other Ambulatory Visit (INDEPENDENT_AMBULATORY_CARE_PROVIDER_SITE_OTHER): Payer: Self-pay | Admitting: Vascular Surgery

## 2021-05-09 DIAGNOSIS — I779 Disorder of arteries and arterioles, unspecified: Secondary | ICD-10-CM

## 2021-05-10 ENCOUNTER — Ambulatory Visit (INDEPENDENT_AMBULATORY_CARE_PROVIDER_SITE_OTHER): Payer: Medicare Other

## 2021-05-10 ENCOUNTER — Ambulatory Visit (INDEPENDENT_AMBULATORY_CARE_PROVIDER_SITE_OTHER): Payer: Medicare Other | Admitting: Nurse Practitioner

## 2021-05-10 ENCOUNTER — Other Ambulatory Visit: Payer: Self-pay

## 2021-05-10 VITALS — BP 102/62 | HR 74 | Ht 70.0 in | Wt 179.0 lb

## 2021-05-10 DIAGNOSIS — I779 Disorder of arteries and arterioles, unspecified: Secondary | ICD-10-CM

## 2021-05-10 DIAGNOSIS — I10 Essential (primary) hypertension: Secondary | ICD-10-CM

## 2021-05-10 DIAGNOSIS — I63239 Cerebral infarction due to unspecified occlusion or stenosis of unspecified carotid arteries: Secondary | ICD-10-CM

## 2021-05-10 DIAGNOSIS — E785 Hyperlipidemia, unspecified: Secondary | ICD-10-CM

## 2021-05-10 DIAGNOSIS — E119 Type 2 diabetes mellitus without complications: Secondary | ICD-10-CM

## 2021-05-20 ENCOUNTER — Encounter (INDEPENDENT_AMBULATORY_CARE_PROVIDER_SITE_OTHER): Payer: Self-pay | Admitting: Nurse Practitioner

## 2021-05-20 NOTE — Progress Notes (Signed)
Subjective:    Patient ID: Dean King, male    DOB: 06-09-1945, 76 y.o.   MRN: [id number] Chief Complaint  Patient presents with   Follow-up    FU with Eulogio Ditch in 4 week  with carotid duplex     Dean King is a 76 year old male that is seen for follow up evaluation of carotid stenosis status post left carotid stent on 04/05/2021.  There were no post operative problems or complications related to the surgery.  The patient denies neck or incisional pain.  The patient denies interval amaurosis fugax.  The patient is doing well and essentially recovered post TIA there is no prior documented CVA.  The patient denies headache.  The patient is taking enteric-coated aspirin 81 mg daily.  The patient has a history of coronary artery disease, no recent episodes of angina or shortness of breath. The patient denies PAD or claudication symptoms. There is a history of hyperlipidemia which is being treated with a statin.    Today there is no evidence of stenosis noted in the left ICA with 1 to 39% stenosis of the right ICA.  Left stent placement is widely patent.  Antegrade flow in the bilateral vertebral arteries with normal flow.  Normal flow hemodynamics in the bilateral subclavian arteries.   Review of Systems     Objective:   Physical Exam  BP 102/62    Pulse 74    Ht 5\' 10"  (1.778 m)    Wt 179 lb (81.2 kg)    BMI 25.68 kg/m   Past Medical History:  Diagnosis Date   Diabetes mellitus without complication (HCC)    Hypertension     Social History   Socioeconomic History   Marital status: Married    Spouse name: Not on file   Number of children: Not on file   Years of education: Not on file   Highest education level: Not on file  Occupational History   Not on file  Tobacco Use   Smoking status: Some Days    Types: Cigars   Smokeless tobacco: Never  Vaping Use   Vaping Use: Never used  Substance and Sexual Activity   Alcohol use: Never   Drug use: Never   Sexual  activity: Not on file  Other Topics Concern   Not on file  Social History Narrative   Not on file   Social Determinants of Health   Financial Resource Strain: Not on file  Food Insecurity: Not on file  Transportation Needs: Not on file  Physical Activity: Not on file  Stress: Not on file  Social Connections: Not on file  Intimate Partner Violence: Not on file    Past Surgical History:  Procedure Laterality Date   CARDIAC CATHETERIZATION     CAROTID PTA/STENT INTERVENTION Left 04/05/2021   Procedure: CAROTID PTA/STENT INTERVENTION;  Surgeon: Algernon Huxley, MD;  Location: [place] [facility];  Service: Cardiovascular;  Laterality: Left;   CHOLECYSTECTOMY     CORONARY ANGIOPLASTY     liver transplannt      Family History  Family history unknown: Yes    Allergies  Allergen Reactions   Levaquin [Levofloxacin]     CBC Latest Ref Rng & Units 04/06/2021 03/23/2021  WBC 4.0 - 10.5 K/uL 9.2 11.1(H)  Hemoglobin 13.0 - 17.0 g/dL 10.9(L) 12.8(L)  Hematocrit 39.0 - 52.0 % 34.2(L) 41.2  Platelets 150 - 400 K/uL 248 273      CMP     Component Value  Date/Time   NA 137 04/06/2021 0457   K 3.9 04/06/2021 0457   CL 106 04/06/2021 0457   CO2 25 04/06/2021 0457   GLUCOSE 174 (H) 04/06/2021 0457   BUN 13 04/06/2021 0457   CREATININE 1.03 04/06/2021 0457   CALCIUM 8.7 (L) 04/06/2021 0457   PROT 7.8 03/23/2021 1819   ALBUMIN 3.8 03/23/2021 1819   AST 20 03/23/2021 1819   ALT 13 03/23/2021 1819   ALKPHOS 64 03/23/2021 1819   BILITOT 0.5 03/23/2021 1819   GFRNONAA >60 04/06/2021 0457     No results found.     Assessment & Plan:   1. Carotid stenosis, symptomatic, with infarction [facility]) Recommend:  The patient is s/p successful left carotid stent  Duplex ultrasound preoperatively shows 1-39% contralateral stenosis.  Continue antiplatelet therapy as prescribed Continue management of CAD, HTN and Hyperlipidemia Healthy heart diet,  encouraged exercise at least 4 times  per week  Follow up in 3 months with duplex ultrasound and physical exam based on the patient's carotid intervention  2. Essential hypertension Continue antihypertensive medications as already ordered, these medications have been reviewed and there are no changes at this time.   3. Hyperlipidemia, unspecified hyperlipidemia type Continue statin as ordered and reviewed, no changes at this time   4. Type 2 diabetes mellitus without complication, without long-term current use of insulin (HCC) Continue hypoglycemic medications as already ordered, these medications have been reviewed and there are no changes at this time.  Hgb A1C to be monitored as already arranged by primary service    Current Outpatient Medications on File Prior to Visit  Medication Sig Dispense Refill   amoxicillin (AMOXIL) 500 MG capsule SMARTSIG:4 Capsule(s) By Mouth     atorvastatin (LIPITOR) 40 MG tablet Take 1 tablet (40 mg total) by mouth daily. 30 tablet 2   Calcium Carbonate-Vit D-Min (CALCIUM 600+D3 PLUS MINERALS) 600-800 MG-UNIT TABS Take 1 tablet by mouth 3 (three) times daily.     clopidogrel (PLAVIX) 75 MG tablet Take 75 mg by mouth daily.     dapagliflozin propanediol (FARXIGA) 10 MG TABS tablet Take by mouth daily.     glipiZIDE (GLUCOTROL) 5 MG tablet Take 5 mg by mouth 2 (two) times daily before a meal.     losartan (COZAAR) 25 MG tablet Take 1 tablet (25 mg total) by mouth daily. 30 tablet 1   magnesium oxide (MAG-OX) 400 MG tablet Take 400 mg by mouth 2 (two) times daily.     metFORMIN (GLUCOPHAGE) 500 MG tablet Take 1,000 mg by mouth 2 (two) times daily.     metoprolol succinate (TOPROL XL) 25 MG 24 hr tablet Take 1 tablet (25 mg total) by mouth daily. 30 tablet 1   nitroGLYCERIN (NITROSTAT) 0.4 MG SL tablet Place under the tongue.     omeprazole (PRILOSEC) 20 MG capsule Take 20 mg by mouth at bedtime.     PROGRAF 0.5 MG capsule Take 0.5 mg by mouth 2 (two) times daily.     aspirin EC 81 MG tablet  Take 81 mg by mouth at bedtime.     No current facility-administered medications on file prior to visit.    There are no Patient Instructions on file for this visit. No follow-ups on file.   Kris Hartmann, NP

## 2021-07-23 ENCOUNTER — Ambulatory Visit (INDEPENDENT_AMBULATORY_CARE_PROVIDER_SITE_OTHER): Payer: Medicare Other | Admitting: Podiatry

## 2021-07-23 DIAGNOSIS — L84 Corns and callosities: Secondary | ICD-10-CM

## 2021-07-23 DIAGNOSIS — Q66222 Congenital metatarsus adductus, left foot: Secondary | ICD-10-CM

## 2021-07-23 DIAGNOSIS — E119 Type 2 diabetes mellitus without complications: Secondary | ICD-10-CM | POA: Diagnosis not present

## 2021-07-23 DIAGNOSIS — Q66221 Congenital metatarsus adductus, right foot: Secondary | ICD-10-CM | POA: Diagnosis not present

## 2021-07-24 NOTE — Progress Notes (Signed)
?  Subjective:  ?Patient ID: Dean King, male    DOB: 06-Dec-1945,  MRN: [id number] ? ?Chief Complaint  ?Patient presents with  ? Foot Pain  ?   NP// R foot pain toward side of foot, hurts when he tries to walk, knot on side of foot  ? ? ?76 y.o. male presents with the above complaint. History confirmed with patient.  He presents with a complaint of a painful callus lesion on the right foot he has on the left foot but is not as painful.  He walks 3 to 4 miles a day to try to keep his A1c down, its been painful and interfering limiting his ability to walk and exercise. ? ?Objective:  ?Physical Exam: ?warm, good capillary refill, no trophic changes or ulcerative lesions, normal DP and PT pulses, normal monofilament exam, normal sensory exam, and is prominent fifth metatarsal base is likely metatarsus adductus deformity, there is preulcerative callus at the base of fifth metatarsal bilateral, right side is quite painful. ? ?Assessment:  ? ?1. Pre-ulcerative calluses   ?2. Metatarsus adductus of both feet   ?3. Diabetes mellitus type 2 in nonobese [facility])   ? ? ? ?Plan:  ?Patient was evaluated and treated and all questions answered. ? ?All symptomatic hyperkeratoses were safely debrided with a sterile #15 blade to patient's level of comfort without incident. We discussed preventative and palliative care of these lesions including supportive and accommodative shoegear, padding, prefabricated and custom molded accommodative orthoses, use of a pumice stone and lotions/creams daily. ? ?Patient educated on diabetes. Discussed proper diabetic foot care and discussed risks and complications of disease. Educated patient in depth on reasons to return to the office immediately should he/she discover anything concerning or new on the feet. All questions answered. Discussed proper shoes as well.  ? ?I discussed with him the best offloading be an extra-depth diabetic shoe with a custom molded insert to offload the fifth metatarsal  base and prevent ulceration risk.  He has significant foot deformity with prominence of the fifth metatarsal base likely metatarsus adductus.  There are preulcerative calluses here as well.  He will be scheduled and seen by our pedorthist for this. ? ?Return if symptoms worsen or fail to improve.  ? ?

## 2021-08-06 ENCOUNTER — Other Ambulatory Visit (INDEPENDENT_AMBULATORY_CARE_PROVIDER_SITE_OTHER): Payer: Self-pay | Admitting: Vascular Surgery

## 2021-08-06 DIAGNOSIS — I6523 Occlusion and stenosis of bilateral carotid arteries: Secondary | ICD-10-CM

## 2021-08-07 ENCOUNTER — Encounter (INDEPENDENT_AMBULATORY_CARE_PROVIDER_SITE_OTHER): Payer: Self-pay | Admitting: Nurse Practitioner

## 2021-08-07 ENCOUNTER — Ambulatory Visit (INDEPENDENT_AMBULATORY_CARE_PROVIDER_SITE_OTHER): Payer: Medicare Other

## 2021-08-07 ENCOUNTER — Ambulatory Visit (INDEPENDENT_AMBULATORY_CARE_PROVIDER_SITE_OTHER): Payer: Medicare Other | Admitting: Nurse Practitioner

## 2021-08-07 VITALS — BP 105/63 | HR 82 | Resp 16 | Wt 172.8 lb

## 2021-08-07 DIAGNOSIS — E785 Hyperlipidemia, unspecified: Secondary | ICD-10-CM | POA: Diagnosis not present

## 2021-08-07 DIAGNOSIS — I10 Essential (primary) hypertension: Secondary | ICD-10-CM

## 2021-08-07 DIAGNOSIS — E119 Type 2 diabetes mellitus without complications: Secondary | ICD-10-CM | POA: Diagnosis not present

## 2021-08-07 DIAGNOSIS — I779 Disorder of arteries and arterioles, unspecified: Secondary | ICD-10-CM | POA: Diagnosis not present

## 2021-08-07 DIAGNOSIS — I6523 Occlusion and stenosis of bilateral carotid arteries: Secondary | ICD-10-CM | POA: Diagnosis not present

## 2021-08-10 ENCOUNTER — Ambulatory Visit: Payer: Medicare Other

## 2021-08-10 DIAGNOSIS — Q66221 Congenital metatarsus adductus, right foot: Secondary | ICD-10-CM

## 2021-08-10 DIAGNOSIS — L84 Corns and callosities: Secondary | ICD-10-CM

## 2021-08-10 DIAGNOSIS — E119 Type 2 diabetes mellitus without complications: Secondary | ICD-10-CM

## 2021-08-10 NOTE — Progress Notes (Signed)
SITUATION Reason for Consult: Evaluation for Prefabricated Diabetic Shoes and Custom Diabetic Inserts. Patient / Caregiver Report: Patient would like well fitting shoes  OBJECTIVE DATA: Patient History / Diagnosis:    ICD-10-CM   1. Diabetes mellitus type 2 in nonobese (HCC)  E11.9     2. Metatarsus adductus of both feet  Q66.221    Q66.222     3. Pre-ulcerative calluses  L84       Physician Treating Diabetes:  Albina Billet, MD  Current or Previous Devices:   None  In-Person Foot Examination: Ulcers & Callousing:   Submet calluses Deformities:    Metatarsus Adductus Sensation:    Intact  Shoe Size:     69M  ORTHOTIC RECOMMENDATION Recommended Devices: - 1x pair prefabricated PDAC approved diabetic shoes; Patient Selected Orth[employer] [place] Size 69M - 3x pair custom-to-patient PDAC approved vacuum formed diabetic insoles.  GOALS OF SHOES AND INSOLES - Reduce shear and pressure - Reduce / Prevent callus formation - Reduce / Prevent ulceration - Protect the fragile healing compromised diabetic foot.  Patient would benefit from diabetic shoes and inserts as patient has diabetes mellitus and the patient has one or more of the following conditions: - History of partial or complete amputation of the foot - History of previous foot ulceration. - History of pre-ulcerative callus - Peripheral neuropathy with evidence of callus formation - Foot deformity - Poor circulation  ACTIONS PERFORMED Potential out of pocket cost was communicated to patient. Patient understood and consented to measurement and casting. Patient was casted for insoles via crush box and measured for shoes via brannock device. Procedure was explained and patient tolerated procedure well. All questions were answered and concerns addressed. Casts were shipped to central fabrication for HOLD until Certificate of Medical Necessity or otherwise necessary authorization from insurance is obtained.  PLAN Shoes  are to be ordered and casts released from hold once all appropriate paperwork is complete. Patient is to be contacted and scheduled for fitting once shoes and insoles have been fabricated and received.

## 2021-08-14 ENCOUNTER — Encounter (INDEPENDENT_AMBULATORY_CARE_PROVIDER_SITE_OTHER): Payer: Self-pay | Admitting: Nurse Practitioner

## 2021-08-14 NOTE — Progress Notes (Signed)
Subjective:    Patient ID: Dean King, male    DOB: 1945/12/05, 76 y.o.   MRN: [id number] Chief Complaint  Patient presents with   Follow-up    Ultrasound follow up    The patient is seen for follow up evaluation of carotid stenosis. The carotid stenosis followed by ultrasound.  The patient had a left ICA stent placed on 04/05/2021.  The patient denies amaurosis fugax. There is no recent history of TIA symptoms or focal motor deficits. There is no prior documented CVA.  The patient is taking enteric-coated aspirin 81 mg daily.  There is no history of migraine headaches. There is no history of seizures.  The patient does note that his legs have recently been feeling tired and heavy he also has a known EF of 20 to 25% and has been told he needs a defibrillator.  There is no history of DVT, PE or superficial thrombophlebitis. No recent episodes of angina or shortness of breath documented.   Carotid Duplex done today shows <40% in the bilateral ICAs.  No change compared to last study in 05/01/2021    Review of Systems  All other systems reviewed and are negative.     Objective:   Physical Exam Vitals reviewed.  HENT:     Head: Normocephalic.  Neck:     Vascular: No carotid bruit.  Cardiovascular:     Rate and Rhythm: Normal rate.  Pulmonary:     Effort: Pulmonary effort is normal.  Skin:    General: Skin is warm and dry.  Neurological:     Mental Status: He is alert and oriented to person, place, and time.  Psychiatric:        Mood and Affect: Mood normal.        Behavior: Behavior normal.        Thought Content: Thought content normal.        Judgment: Judgment normal.    BP 105/63 (BP Location: Left Arm)   Pulse 82   Resp 16   Wt 172 lb 12.8 oz (78.4 kg)   BMI 24.79 kg/m   Past Medical History:  Diagnosis Date   Diabetes mellitus without complication (HCC)    Hypertension     Social History   Socioeconomic History   Marital status: Married     Spouse name: Not on file   Number of children: Not on file   Years of education: Not on file   Highest education level: Not on file  Occupational History   Not on file  Tobacco Use   Smoking status: Some Days    Types: Cigars   Smokeless tobacco: Never  Vaping Use   Vaping Use: Never used  Substance and Sexual Activity   Alcohol use: Never   Drug use: Never   Sexual activity: Not on file  Other Topics Concern   Not on file  Social History Narrative   Not on file   Social Determinants of Health   Financial Resource Strain: Not on file  Food Insecurity: Not on file  Transportation Needs: Not on file  Physical Activity: Not on file  Stress: Not on file  Social Connections: Not on file  Intimate Partner Violence: Not on file    Past Surgical History:  Procedure Laterality Date   CARDIAC CATHETERIZATION     CAROTID PTA/STENT INTERVENTION Left 04/05/2021   Procedure: CAROTID PTA/STENT INTERVENTION;  Surgeon: Algernon Huxley, MD;  Location: [place] [facility];  Service: Cardiovascular;  Laterality: Left;   CHOLECYSTECTOMY     CORONARY ANGIOPLASTY     liver transplannt      Family History  Family history unknown: Yes    Allergies  Allergen Reactions   Levaquin [Levofloxacin]        Latest Ref Rng & Units 04/06/2021    4:57 AM 03/23/2021    6:19 PM  CBC  WBC 4.0 - 10.5 K/uL 9.2   11.1    Hemoglobin 13.0 - 17.0 g/dL 10.9   12.8    Hematocrit 39.0 - 52.0 % 34.2   41.2    Platelets 150 - 400 K/uL 248   273        CMP     Component Value Date/Time   NA 137 04/06/2021 0457   K 3.9 04/06/2021 0457   CL 106 04/06/2021 0457   CO2 25 04/06/2021 0457   GLUCOSE 174 (H) 04/06/2021 0457   BUN 13 04/06/2021 0457   CREATININE 1.03 04/06/2021 0457   CALCIUM 8.7 (L) 04/06/2021 0457   PROT 7.8 03/23/2021 1819   ALBUMIN 3.8 03/23/2021 1819   AST 20 03/23/2021 1819   ALT 13 03/23/2021 1819   ALKPHOS 64 03/23/2021 1819   BILITOT 0.5 03/23/2021 1819   GFRNONAA >60  04/06/2021 0457     No results found.     Assessment & Plan:   1. Carotid artery disease, unspecified laterality, unspecified type ([place]) Recommend:  Given the patient's asymptomatic subcritical stenosis no further invasive testing or surgery at this time.  Duplex ultrasound shows <40% stenosis bilaterally.  Continue antiplatelet therapy as prescribed Continue management of CAD, HTN and Hyperlipidemia Healthy heart diet,  encouraged exercise at least 4 times per week Follow up in 6 months with duplex ultrasound and physical exam    2. Essential hypertension Continue antihypertensive medications as already ordered, these medications have been reviewed and there are no changes at this time.   3. Hyperlipidemia, unspecified hyperlipidemia type Continue statin as ordered and reviewed, no changes at this time   4. Type 2 diabetes mellitus without complication, without long-term current use of insulin (HCC) Continue hypoglycemic medications as already ordered, these medications have been reviewed and there are no changes at this time.  Hgb A1C to be monitored as already arranged by primary service    Current Outpatient Medications on File Prior to Visit  Medication Sig Dispense Refill   amoxicillin (AMOXIL) 500 MG capsule SMARTSIG:4 Capsule(s) By Mouth     atorvastatin (LIPITOR) 40 MG tablet Take 1 tablet (40 mg total) by mouth daily. 30 tablet 2   Calcium Carbonate-Vit D-Min (CALCIUM 600+D3 PLUS MINERALS) 600-800 MG-UNIT TABS Take 1 tablet by mouth 3 (three) times daily.     clopidogrel (PLAVIX) 75 MG tablet Take 75 mg by mouth daily.     dapagliflozin propanediol (FARXIGA) 10 MG TABS tablet Take by mouth daily.     ELIQUIS 5 MG TABS tablet Take 5 mg by mouth 2 (two) times daily.     glipiZIDE (GLUCOTROL) 5 MG tablet Take 5 mg by mouth 2 (two) times daily before a meal.     magnesium oxide (MAG-OX) 400 MG tablet Take 400 mg by mouth 2 (two) times daily.     metFORMIN (GLUCOPHAGE)  500 MG tablet Take 1,000 mg by mouth 2 (two) times daily.     metoprolol succinate (TOPROL XL) 25 MG 24 hr tablet Take 1 tablet (25 mg total) by mouth daily. 30 tablet 1   nitroGLYCERIN (NITROSTAT)  0.4 MG SL tablet Place under the tongue.     omeprazole (PRILOSEC) 20 MG capsule Take 20 mg by mouth at bedtime.     PROGRAF 0.5 MG capsule Take 0.5 mg by mouth 2 (two) times daily.     spironolactone (ALDACTONE) 25 MG tablet Take 25 mg by mouth daily.     losartan (COZAAR) 25 MG tablet Take 1 tablet (25 mg total) by mouth daily. 30 tablet 1   No current facility-administered medications on file prior to visit.    There are no Patient Instructions on file for this visit. No follow-ups on file.   Kris Hartmann, NP

## 2021-08-23 ENCOUNTER — Telehealth: Payer: Self-pay

## 2021-08-23 NOTE — Telephone Encounter (Signed)
CMN Received - casts released and shoes ordered  [place]

## 2021-08-29 DIAGNOSIS — I5020 Unspecified systolic (congestive) heart failure: Secondary | ICD-10-CM | POA: Insufficient documentation

## 2021-10-05 ENCOUNTER — Ambulatory Visit (INDEPENDENT_AMBULATORY_CARE_PROVIDER_SITE_OTHER): Payer: Medicare Other | Admitting: Podiatry

## 2021-10-05 DIAGNOSIS — L84 Corns and callosities: Secondary | ICD-10-CM

## 2021-10-05 DIAGNOSIS — E119 Type 2 diabetes mellitus without complications: Secondary | ICD-10-CM

## 2021-10-05 NOTE — Progress Notes (Signed)
Patient presents to pick up Diabetic shoes/inserts.  Instructions were given as well as the Medicare Suppliers Guidelines.  Patient stated she understood.  I asked her to call me if she has any questions or concerns.

## 2021-11-12 ENCOUNTER — Ambulatory Visit (INDEPENDENT_AMBULATORY_CARE_PROVIDER_SITE_OTHER): Payer: Medicare Other | Admitting: Podiatry

## 2021-11-12 ENCOUNTER — Encounter: Payer: Self-pay | Admitting: Podiatry

## 2021-11-12 DIAGNOSIS — E119 Type 2 diabetes mellitus without complications: Secondary | ICD-10-CM | POA: Diagnosis not present

## 2021-11-12 DIAGNOSIS — L84 Corns and callosities: Secondary | ICD-10-CM

## 2021-11-12 NOTE — Progress Notes (Signed)
  Subjective:  Patient ID: Dean King, male    DOB: 1945-06-08,  MRN: [id number]  Chief Complaint  Patient presents with   Callouses    "The place he cut the bunion off is sore as the dickens."    76 y.o. male presents with the above complaint. History confirmed with patient.  Painful callus on the right foot has returned and bothering him again  Objective:  Physical Exam: warm, good capillary refill, no trophic changes or ulcerative lesions, normal DP and PT pulses, normal monofilament exam, normal sensory exam, and is prominent fifth metatarsal base is likely metatarsus adductus deformity, there is preulcerative callus at the base of fifth metatarsal bilateral, right side is quite painful.  Assessment:   1. Diabetes mellitus type 2 in nonobese (HCC)   2. Pre-ulcerative calluses      Plan:  Patient was evaluated and treated and all questions answered.  All symptomatic hyperkeratoses were safely debrided with a sterile #15 blade to patient's level of comfort without incident. We discussed preventative and palliative care of these lesions including supportive and accommodative shoegear, padding, prefabricated and custom molded accommodative orthoses, use of a pumice stone and lotions/creams daily.  Recommended urea 40% cream with 2% salicylic acid he will get this online, discussed if he is unable to find it and that we can order this from [place] [facility] compound pharmacy    Return in about 3 months (around 02/12/2022) for painful callus .

## 2021-11-12 NOTE — Patient Instructions (Signed)
Look for urea 70% with 2% salicylic acid cream or ointment and apply to the thickened dry skin / calluses. This can be bought over the counter, at a pharmacy or online such as [employer]. If you have difficulty finding it please let me know and I can order it from a compounding pharmacy

## 2022-01-21 ENCOUNTER — Encounter (INDEPENDENT_AMBULATORY_CARE_PROVIDER_SITE_OTHER): Payer: Self-pay

## 2022-02-01 ENCOUNTER — Other Ambulatory Visit: Payer: Self-pay

## 2022-02-01 DIAGNOSIS — I679 Cerebrovascular disease, unspecified: Secondary | ICD-10-CM

## 2022-02-08 ENCOUNTER — Ambulatory Visit (INDEPENDENT_AMBULATORY_CARE_PROVIDER_SITE_OTHER): Payer: Medicare Other

## 2022-02-08 ENCOUNTER — Ambulatory Visit (INDEPENDENT_AMBULATORY_CARE_PROVIDER_SITE_OTHER): Payer: Medicare Other | Admitting: Vascular Surgery

## 2022-02-08 ENCOUNTER — Encounter (INDEPENDENT_AMBULATORY_CARE_PROVIDER_SITE_OTHER): Payer: Self-pay | Admitting: Vascular Surgery

## 2022-02-08 VITALS — BP 102/60 | HR 78 | Resp 18 | Ht 70.0 in | Wt 169.4 lb

## 2022-02-08 DIAGNOSIS — I63239 Cerebral infarction due to unspecified occlusion or stenosis of unspecified carotid arteries: Secondary | ICD-10-CM | POA: Diagnosis not present

## 2022-02-08 DIAGNOSIS — I10 Essential (primary) hypertension: Secondary | ICD-10-CM | POA: Diagnosis not present

## 2022-02-08 DIAGNOSIS — I679 Cerebrovascular disease, unspecified: Secondary | ICD-10-CM | POA: Diagnosis not present

## 2022-02-08 DIAGNOSIS — I6523 Occlusion and stenosis of bilateral carotid arteries: Secondary | ICD-10-CM | POA: Diagnosis not present

## 2022-02-08 DIAGNOSIS — G459 Transient cerebral ischemic attack, unspecified: Secondary | ICD-10-CM

## 2022-02-08 DIAGNOSIS — Z944 Liver transplant status: Secondary | ICD-10-CM

## 2022-02-08 DIAGNOSIS — E785 Hyperlipidemia, unspecified: Secondary | ICD-10-CM

## 2022-02-08 DIAGNOSIS — E119 Type 2 diabetes mellitus without complications: Secondary | ICD-10-CM

## 2022-02-08 NOTE — Assessment & Plan Note (Signed)
Carotid duplex today demonstrates a widely patent left carotid stent and 1 to 39% right ICA stenosis.  Doing well.  Okay to come off of Plavix especially since he is on Eliquis.  Recheck in 6 months and if it still looks good, can go to once a year follow-up after that.

## 2022-02-08 NOTE — Progress Notes (Signed)
MRN : [id number]  Dean King is a 76 y.o. ([birth year]) male who presents with chief complaint of No chief complaint on file. Marland Kitchen  History of Present Illness: Patient returns today in follow up of his carotid disease.  He is doing well today.  He has had no focal neurologic symptoms.  He underwent a left carotid stent about 10 months ago.  He is now on Eliquis and asks if he can come off of his Plavix which he has been on since his stent placement and actually before.  Carotid duplex today reveals 1 to 39% right ICA stenosis and a widely patent left carotid stent.  Current Outpatient Medications  Medication Sig Dispense Refill   amoxicillin (AMOXIL) 500 MG capsule SMARTSIG:4 Capsule(s) By Mouth     atorvastatin (LIPITOR) 40 MG tablet Take 1 tablet (40 mg total) by mouth daily. 30 tablet 2   Calcium Carbonate-Vit D-Min (CALCIUM 600+D3 PLUS MINERALS) 600-800 MG-UNIT TABS Take 1 tablet by mouth 3 (three) times daily.     clopidogrel (PLAVIX) 75 MG tablet Take 75 mg by mouth daily.     dapagliflozin propanediol (FARXIGA) 10 MG TABS tablet Take by mouth daily.     ELIQUIS 5 MG TABS tablet Take 5 mg by mouth 2 (two) times daily.     glipiZIDE (GLUCOTROL) 5 MG tablet Take 5 mg by mouth 2 (two) times daily before a meal.     magnesium oxide (MAG-OX) 400 MG tablet Take 400 mg by mouth 2 (two) times daily.     metFORMIN (GLUCOPHAGE) 500 MG tablet Take 1,000 mg by mouth 2 (two) times daily.     metoprolol succinate (TOPROL-XL) 100 MG 24 hr tablet Take 100 mg by mouth daily.     nitroGLYCERIN (NITROSTAT) 0.4 MG SL tablet Place under the tongue.     PROGRAF 0.5 MG capsule Take 0.5 mg by mouth 2 (two) times daily.     spironolactone (ALDACTONE) 25 MG tablet Take 25 mg by mouth daily.     losartan (COZAAR) 25 MG tablet Take 1 tablet (25 mg total) by mouth daily. 30 tablet 1   metoprolol succinate (TOPROL XL) 25 MG 24 hr tablet Take 1 tablet (25 mg total) by mouth daily. (Patient taking differently: Take  100 mg by mouth daily.) 30 tablet 1   No current facility-administered medications for this visit.    Past Medical History:  Diagnosis Date   Diabetes mellitus without complication ([place])    Hypertension     Past Surgical History:  Procedure Laterality Date   CARDIAC CATHETERIZATION     CAROTID PTA/STENT INTERVENTION Left 04/05/2021   Procedure: CAROTID PTA/STENT INTERVENTION;  Surgeon: Algernon Huxley, MD;  Location: [place] [facility];  Service: Cardiovascular;  Laterality: Left;   CHOLECYSTECTOMY     CORONARY ANGIOPLASTY     liver transplannt       Social History   Tobacco Use   Smoking status: Some Days    Types: Cigars   Smokeless tobacco: Never  Vaping Use   Vaping Use: Never used  Substance Use Topics   Alcohol use: Not Currently   Drug use: Never       Family History  Family history unknown: Yes     Allergies  Allergen Reactions   Levaquin [Levofloxacin]     REVIEW OF SYSTEMS (Negative unless checked)   Constitutional: '[]'$ Weight loss  '[]'$ Fever  '[]'$ Chills Cardiac: '[]'$ Chest pain   '[]'$ Chest pressure   '[]'$ Palpitations   '[]'$ Shortness  of breath when laying flat   '[]'$ Shortness of breath at rest   '[]'$ Shortness of breath with exertion. Vascular:  '[]'$ Pain in legs with walking   '[]'$ Pain in legs at rest   '[]'$ Pain in legs when laying flat   '[]'$ Claudication   '[]'$ Pain in feet when walking  '[]'$ Pain in feet at rest  '[]'$ Pain in feet when laying flat   '[]'$ History of DVT   '[]'$ Phlebitis   '[]'$ Swelling in legs   '[]'$ Varicose veins   '[]'$ Non-healing ulcers Pulmonary:   '[]'$ Uses home oxygen   '[]'$ Productive cough   '[]'$ Hemoptysis   '[]'$ Wheeze  '[]'$ COPD   '[]'$ Asthma Neurologic:  '[]'$ Dizziness  '[]'$ Blackouts   '[]'$ Seizures   '[x]'$ History of stroke   '[]'$ History of TIA  '[]'$ Aphasia   '[]'$ Temporary blindness   '[]'$ Dysphagia   '[]'$ Weakness or numbness in arms   '[]'$ Weakness or numbness in legs Musculoskeletal:  '[x]'$ Arthritis   '[]'$ Joint swelling   '[x]'$ Joint pain   '[]'$ Low back pain Hematologic:  '[]'$ Easy bruising  '[]'$ Easy bleeding    '[]'$ Hypercoagulable state   '[x]'$ Anemic  '[]'$ Hepatitis Gastrointestinal:  '[]'$ Blood in stool   '[]'$ Vomiting blood  '[]'$ Gastroesophageal reflux/heartburn   '[]'$ Abdominal pain Genitourinary:  '[]'$ Chronic kidney disease   '[]'$ Difficult urination  '[]'$ Frequent urination  '[]'$ Burning with urination   '[]'$ Hematuria Skin:  '[]'$ Rashes   '[]'$ Ulcers   '[]'$ Wounds Psychological:  '[]'$ History of anxiety   '[]'$  History of major depression.  Physical Examination  BP 102/60 (BP Location: Right Arm)   Pulse 78   Resp 18   Ht '5\' 10"'$  (1.778 m)   Wt 169 lb 6.4 oz (76.8 kg)   BMI 24.31 kg/m  Gen:  WD/WN, NAD Head: [place]/AT, No temporalis wasting. Ear/Nose/Throat: Hearing grossly intact, nares w/o erythema or drainage Eyes: Conjunctiva clear. Sclera non-icteric Neck: Supple.  Trachea midline Pulmonary:  Good air movement, no use of accessory muscles.  Cardiac: RRR, no JVD Vascular:  Vessel Right Left  Radial Palpable Palpable               Musculoskeletal: M/S 5/5 throughout.  No deformity or atrophy. No LE edema. Neurologic: Sensation grossly intact in extremities.  Symmetrical.  Speech is fluent.  Psychiatric: Judgment intact, Mood & affect appropriate for pt's clinical situation. Dermatologic: No rashes or ulcers noted.  No cellulitis or open wounds.      Labs No results found for this or any previous visit (from the past 2160 hour(s)).  Radiology No results found.  Assessment/Plan Essential hypertension blood pressure control important in reducing the progression of atherosclerotic disease. On appropriate oral medications.     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  Prior to intervention   Diabetes mellitus ([place]) blood glucose control important in reducing the progression of atherosclerotic disease. Also, involved in wound healing. On appropriate medications.     HLD (hyperlipidemia) lipid control important in reducing the progression of atherosclerotic disease. Continue statin therapy    Carotid stenosis, symptomatic, with infarction ([place]) Carotid duplex today demonstrates a widely patent left carotid stent and 1 to 39% right ICA stenosis.  Doing well.  Okay to come off of Plavix especially since he is on Eliquis.  Recheck in 6 months and if it still looks good, can go to once a year follow-up after that.    [name], MD  02/08/2022 12:59 PM    This note was created with Dragon medical transcription system.  Any errors from dictation are purely unintentional

## 2022-02-18 ENCOUNTER — Ambulatory Visit (INDEPENDENT_AMBULATORY_CARE_PROVIDER_SITE_OTHER): Payer: Medicare Other | Admitting: Podiatry

## 2022-02-18 DIAGNOSIS — L84 Corns and callosities: Secondary | ICD-10-CM | POA: Diagnosis not present

## 2022-02-18 DIAGNOSIS — E119 Type 2 diabetes mellitus without complications: Secondary | ICD-10-CM

## 2022-02-18 NOTE — Progress Notes (Signed)
  Subjective:  Patient ID: Dean King, male    DOB: [birth year],  MRN: [id number]  Chief Complaint  Patient presents with   Callouses    3 month follow up, pre-ulcerative callus right foot    76 y.o. male presents with the above complaint. History confirmed with patient.  Calluses doing better is gotten thick again and he is been able to walk more. Objective:  Physical Exam: warm, good capillary refill, no trophic changes or ulcerative lesions, normal DP and PT pulses, normal monofilament exam, normal sensory exam, and is prominent fifth metatarsal base is likely metatarsus adductus deformity, there is preulcerative callus at the base of fifth metatarsal bilateral, right side is quite painful.  Assessment:   1. Pre-ulcerative calluses   2. Diabetes mellitus type 2 in nonobese [facility])      Plan:  Patient was evaluated and treated and all questions answered.  All symptomatic hyperkeratoses were safely debrided with a sterile #15 blade to patient's level of comfort without incident. We discussed preventative and palliative care of these lesions including supportive and accommodative shoegear, padding, prefabricated and custom molded accommodative orthoses, use of a pumice stone and lotions/creams daily.  Continue using urea with salicylic acid, doing very well and seems to be controlling it at this point.  He will return as needed for further debridements    Return if symptoms worsen or fail to improve.

## 2022-04-23 ENCOUNTER — Ambulatory Visit (INDEPENDENT_AMBULATORY_CARE_PROVIDER_SITE_OTHER): Payer: Medicare Other | Admitting: Podiatry

## 2022-04-23 DIAGNOSIS — L97511 Non-pressure chronic ulcer of other part of right foot limited to breakdown of skin: Secondary | ICD-10-CM

## 2022-04-23 DIAGNOSIS — E119 Type 2 diabetes mellitus without complications: Secondary | ICD-10-CM

## 2022-04-23 MED ORDER — CEPHALEXIN 500 MG PO CAPS: 500.0000 mg | ORAL_CAPSULE | Freq: Three times a day (TID) | ORAL | 0 refills | Status: AC

## 2022-04-23 MED ORDER — MUPIROCIN 2 % EX OINT: 1.0000 | TOPICAL_OINTMENT | Freq: Every day | CUTANEOUS | 2 refills | Status: DC

## 2022-04-25 NOTE — Progress Notes (Signed)
  Subjective:  Patient ID: Dean King, male    DOB: 1945/04/13,  MRN: [id number]  Chief Complaint  Patient presents with   Diabetes   Callouses    right foot pain has returned    77 y.o. male presents with the above complaint. History confirmed with patient.  He is having more right foot pain, there is blistering around the outside of the foot  Objective:  Physical Exam: warm, good capillary refill, no trophic changes or ulcerative lesions, normal DP and PT pulses, normal monofilament exam, normal sensory exam, and is prominent fifth metatarsal base with likely metatarsus adductus deformity, there is ulceration at the lateral fifth metatarsal limited to breakdown of skin mild erythema here serous drainage, no cellulitis purulence exposed bone tendon joint     Assessment:   1. Ulcer of right foot, limited to breakdown of skin ([place])   2. Diabetes mellitus type 2 in nonobese [facility])      Plan:  Patient was evaluated and treated and all questions answered.  Unfortunately his preulcerative callus has not worsened into a full-thickness ulceration of the lateral fifth metatarsal head.  He had a mild early infection I placed him on Keflex and prescribing. He will change daily.  We discussed shoe gear and I recommended he wear shoes that have less pressure in these areas.  May need to have his diabetic shoes readjusted and refitted.  I will see him back in 3 weeks for follow-up, advised on signs symptoms of worsening ulceration or infection he should notify me immediately if these develop, if systemic signs develop he should present to the ER for evaluation    Return in about 3 weeks (around 05/14/2022) for wound check.

## 2022-05-13 ENCOUNTER — Encounter: Payer: Self-pay | Admitting: Podiatry

## 2022-05-13 ENCOUNTER — Ambulatory Visit (INDEPENDENT_AMBULATORY_CARE_PROVIDER_SITE_OTHER): Payer: Medicare Other | Admitting: Podiatry

## 2022-05-13 VITALS — BP 121/70 | HR 74

## 2022-05-13 DIAGNOSIS — E119 Type 2 diabetes mellitus without complications: Secondary | ICD-10-CM

## 2022-05-13 DIAGNOSIS — L97511 Non-pressure chronic ulcer of other part of right foot limited to breakdown of skin: Secondary | ICD-10-CM

## 2022-05-13 DIAGNOSIS — L97512 Non-pressure chronic ulcer of other part of right foot with fat layer exposed: Secondary | ICD-10-CM

## 2022-05-13 MED ORDER — MUPIROCIN 2 % EX OINT: 1.0000 | TOPICAL_OINTMENT | Freq: Every day | CUTANEOUS | 2 refills | Status: DC

## 2022-05-13 NOTE — Progress Notes (Signed)
  Subjective:  Patient ID: Dean King, male    DOB: 1945-09-25,  MRN: [id number]  Chief Complaint  Patient presents with   Wound Check    "It's doing better, the last few days."    77 y.o. male presents with the above complaint. History confirmed with patient.  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  No cellulitis.  Improved since last visit    Assessment:   1. [place]cer of right [place] with [place] layer exposed ([place] 2. [facility]    Plan:  Patient was evaluated and treated and all questions answered.   Ulcer right foot -We discussed the etiology and factors that are a part of the wound healing process.  We also discussed the risk of infection both soft tissue and osteomyelitis from open ulceration.  Discussed the risk of limb loss if this happens or worsens. -Debridement as below. -Dressed with Iodosorb, DSD. -Continue home dressing changes daily with bandaid-type dressing and mupirocin -Referral for vascular testing placed  Procedure: Excisional Debridement of Wound Rationale: Removal of non-viable soft tissue from the wound to promote healing.  Anesthesia: none Post-Debridement Wound Measurements: Noted above Type of Debridement: Sharp Excisional Tissue Removed: Non-viable soft tissue Depth of Debridement: subcutaneous tissue. Technique: Sharp excisional debridement to bleeding, viable wound base.  Dressing: Dry, sterile, compression dressing. Disposition: Patient tolerated procedure well.    Return  in about 4 weeks (around 06/10/2022) for wound care.

## 2022-05-15 ENCOUNTER — Other Ambulatory Visit: Payer: Self-pay

## 2022-05-15 DIAGNOSIS — L97512 Non-pressure chronic ulcer of other part of right foot with fat layer exposed: Secondary | ICD-10-CM

## 2022-05-28 ENCOUNTER — Encounter (INDEPENDENT_AMBULATORY_CARE_PROVIDER_SITE_OTHER): Payer: Self-pay | Admitting: Nurse Practitioner

## 2022-05-28 ENCOUNTER — Ambulatory Visit (INDEPENDENT_AMBULATORY_CARE_PROVIDER_SITE_OTHER): Payer: Medicare Other | Admitting: Nurse Practitioner

## 2022-05-28 ENCOUNTER — Ambulatory Visit (INDEPENDENT_AMBULATORY_CARE_PROVIDER_SITE_OTHER): Payer: Medicare Other

## 2022-05-28 VITALS — BP 108/66 | HR 78 | Resp 17 | Ht 69.0 in | Wt 172.0 lb

## 2022-05-28 DIAGNOSIS — I10 Essential (primary) hypertension: Secondary | ICD-10-CM | POA: Diagnosis not present

## 2022-05-28 DIAGNOSIS — I7025 Atherosclerosis of native arteries of other extremities with ulceration: Secondary | ICD-10-CM | POA: Diagnosis not present

## 2022-05-28 DIAGNOSIS — E119 Type 2 diabetes mellitus without complications: Secondary | ICD-10-CM

## 2022-05-28 DIAGNOSIS — L97512 Non-pressure chronic ulcer of other part of right foot with fat layer exposed: Secondary | ICD-10-CM

## 2022-05-29 ENCOUNTER — Encounter (INDEPENDENT_AMBULATORY_CARE_PROVIDER_SITE_OTHER): Payer: Self-pay | Admitting: Nurse Practitioner

## 2022-05-29 NOTE — H&P (View-Only) (Signed)
 Subjective:    Patient ID: Dean King, male    DOB: 07/02/1945, 76 y.o.   MRN: [id number] Chief Complaint  Patient presents with   Follow-up    ultreaound    Dean King is a 76-year-old male that presents to the as a referral from Dr. McDonald regarding a slow healing ulceration on his right lower extremity.  He has been receiving excellent wound care via podiatry but the wound has still been slow to heal and there has been concern that he may have some issues with perfusion.  The patient does have a history of carotid stenosis with a previous left carotid stent placement, which he is not having any issues with.  He currently denies any claudication-like symptoms but he does have pain in the right foot itself, largely related to her wound itself.  Today noninvasive studies show an ABI 0.66 on the right with a TBI of 0.38 and on the left he has an ABI of 0.62 with a TBI 0.33.  He has monophasic tibial artery waveforms bilaterally with dampened toe waveforms bilaterally.    Review of Systems  Skin:  Positive for wound.  All other systems reviewed and are negative.      Objective:   Physical Exam Vitals reviewed.  HENT:     Head: Normocephalic.  Cardiovascular:     Rate and Rhythm: Normal rate.     Pulses:          Dorsalis pedis pulses are detected w/ Doppler on the right side and detected w/ Doppler on the left side.       Posterior tibial pulses are detected w/ Doppler on the right side and detected w/ Doppler on the left side.  Pulmonary:     Effort: Pulmonary effort is normal.  Skin:    General: Skin is warm and dry.  Neurological:     Mental Status: He is alert and oriented to person, place, and time.  Psychiatric:        Mood and Affect: Mood normal.        Behavior: Behavior normal.        Thought Content: Thought content normal.     BP 108/66 (BP Location: Left Arm)   Pulse 78   Resp 17   Ht 5' 9" (1.753 m)   Wt 172 lb (78 kg)   BMI 25.40 kg/m   Past  Medical History:  Diagnosis Date   Diabetes mellitus without complication (HCC)    Hypertension     Social History   Socioeconomic History   Marital status: Married    Spouse name: Not on file   Number of children: Not on file   Years of education: Not on file   Highest education level: Not on file  Occupational History   Not on file  Tobacco Use   Smoking status: Some Days    Types: Cigars   Smokeless tobacco: Never  Vaping Use   Vaping Use: Never used  Substance and Sexual Activity   Alcohol use: Not Currently   Drug use: Never   Sexual activity: Not on file  Other Topics Concern   Not on file  Social History Narrative   Not on file   Social Determinants of Health   Financial Resource Strain: Not on file  Food Insecurity: Not on file  Transportation Needs: Not on file  Physical Activity: Not on file  Stress: Not on file  Social Connections: Not on file  Intimate Partner Violence:   Not on file    Past Surgical History:  Procedure Laterality Date   CARDIAC CATHETERIZATION     CAROTID PTA/STENT INTERVENTION Left 04/05/2021   Procedure: CAROTID PTA/STENT INTERVENTION;  Surgeon: Dew, Jason S, MD;  Location: [facility] INVASIVE CV LAB;  Service: Cardiovascular;  Laterality: Left;   CHOLECYSTECTOMY     CORONARY ANGIOPLASTY     liver transplannt      Family History  Family history unknown: Yes    Allergies  Allergen Reactions   Levaquin [Levofloxacin]        Latest Ref Rng & Units 04/06/2021    4:57 AM 03/23/2021    6:19 PM  CBC  WBC 4.0 - 10.5 K/uL 9.2  11.1   Hemoglobin 13.0 - 17.0 g/dL 10.9  12.8   Hematocrit 39.0 - 52.0 % 34.2  41.2   Platelets 150 - 400 K/uL 248  273       CMP     Component Value Date/Time   NA 137 04/06/2021 0457   K 3.9 04/06/2021 0457   CL 106 04/06/2021 0457   CO2 25 04/06/2021 0457   GLUCOSE 174 (H) 04/06/2021 0457   BUN 13 04/06/2021 0457   CREATININE 1.03 04/06/2021 0457   CALCIUM 8.7 (L) 04/06/2021 0457   PROT 7.8  03/23/2021 1819   ALBUMIN 3.8 03/23/2021 1819   AST 20 03/23/2021 1819   ALT 13 03/23/2021 1819   ALKPHOS 64 03/23/2021 1819   BILITOT 0.5 03/23/2021 1819   GFRNONAA >60 04/06/2021 0457     No results found.     Assessment & Plan:   1. Atherosclerosis of native arteries of the extremities with ulceration (HCC)  Recommend:  The patient has evidence of severe atherosclerotic changes of both lower extremities associated with ulceration and tissue loss of the right foot.  This represents a limb threatening ischemia and places the patient at the risk for right limb loss.  Patient should undergo angiography of the right lower extremity with the hope for intervention for limb salvage.  The risks and benefits as well as the alternative therapies was discussed in detail with the patient.  All questions were answered.  Patient agrees to proceed with right lower extremity angiography.  The patient will follow up with me in the office after the procedure.   2. Diabetes mellitus type 2 in nonobese (HCC) Continue hypoglycemic medications as already ordered, these medications have been reviewed and there are no changes at this time.  Hgb A1C to be monitored as already arranged by primary service  3. Essential hypertension Continue antihypertensive medications as already ordered, these medications have been reviewed and there are no changes at this time.   Current Outpatient Medications on File Prior to Visit  Medication Sig Dispense Refill   amoxicillin (AMOXIL) 500 MG capsule      atorvastatin (LIPITOR) 40 MG tablet Take 1 tablet (40 mg total) by mouth daily. 30 tablet 2   Calcium Carbonate-Vit D-Min (CALCIUM 600+D3 PLUS MINERALS) 600-800 MG-UNIT TABS Take 1 tablet by mouth 3 (three) times daily.     dapagliflozin propanediol (FARXIGA) 10 MG TABS tablet Take by mouth daily.     ELIQUIS 5 MG TABS tablet Take 5 mg by mouth 2 (two) times daily.     glipiZIDE (GLUCOTROL) 5 MG tablet Take 5 mg  by mouth 2 (two) times daily before a meal.     magnesium oxide (MAG-OX) 400 MG tablet Take 400 mg by mouth 2 (two) times daily.       metFORMIN (GLUCOPHAGE) 500 MG tablet Take 1,000 mg by mouth 2 (two) times daily.     metoprolol succinate (TOPROL-XL) 100 MG 24 hr tablet Take 100 mg by mouth daily.     mupirocin ointment (BACTROBAN) 2 % Apply 1 Application topically daily. 30 g 2   PROGRAF 0.5 MG capsule Take 0.5 mg by mouth 2 (two) times daily.     spironolactone (ALDACTONE) 25 MG tablet Take 25 mg by mouth daily.     clopidogrel (PLAVIX) 75 MG tablet Take 75 mg by mouth daily. (Patient not taking: Reported on 05/13/2022)     losartan (COZAAR) 25 MG tablet Take 1 tablet (25 mg total) by mouth daily. 30 tablet 1   metoprolol succinate (TOPROL XL) 25 MG 24 hr tablet Take 1 tablet (25 mg total) by mouth daily. (Patient taking differently: Take 100 mg by mouth daily.) 30 tablet 1   nitroGLYCERIN (NITROSTAT) 0.4 MG SL tablet Place under the tongue.     No current facility-administered medications on file prior to visit.    There are no Patient Instructions on file for this visit. No follow-ups on file.   Delquan Poucher E Javeria Briski, NP   

## 2022-05-29 NOTE — Progress Notes (Signed)
Subjective:    Patient ID: Dean King, male    DOB: [birth year], 77 y.o.   MRN: [id number] Chief Complaint  Patient presents with   Follow-up    ultreaound    Dean King is a 77 year old male that presents to the as a referral from Dr. Sherryle Lis regarding a slow healing ulceration on his right lower extremity.  He has been receiving excellent wound care via podiatry but the wound has still been slow to heal and there has been concern that he may have some issues with perfusion.  The patient does have a history of carotid stenosis with a previous left carotid stent placement, which he is not having any issues with.  He currently denies any claudication-like symptoms but he does have pain in the right foot itself, largely related to her wound itself.  Today noninvasive studies show an ABI 0.66 on the right with a TBI of 0.38 and on the left he has an ABI of 0.62 with a TBI 0.33.  He has monophasic tibial artery waveforms bilaterally with dampened toe waveforms bilaterally.    Review of Systems  Skin:  Positive for wound.  All other systems reviewed and are negative.      Objective:   Physical Exam Vitals reviewed.  HENT:     Head: Normocephalic.  Cardiovascular:     Rate and Rhythm: Normal rate.     Pulses:          Dorsalis pedis pulses are detected w/ Doppler on the right side and detected w/ Doppler on the left side.       Posterior tibial pulses are detected w/ Doppler on the right side and detected w/ Doppler on the left side.  Pulmonary:     Effort: Pulmonary effort is normal.  Skin:    General: Skin is warm and dry.  Neurological:     Mental Status: He is alert and oriented to person, place, and time.  Psychiatric:        Mood and Affect: Mood normal.        Behavior: Behavior normal.        Thought Content: Thought content normal.     BP 108/66 (BP Location: Left Arm)   Pulse 78   Resp 17   Ht '5\' 9"'$  (1.753 m)   Wt 172 lb (78 kg)   BMI 25.40 kg/m   Past  Medical History:  Diagnosis Date   Diabetes mellitus without complication (HCC)    Hypertension     Social History   Socioeconomic History   Marital status: Married    Spouse name: Not on file   Number of children: Not on file   Years of education: Not on file   Highest education level: Not on file  Occupational History   Not on file  Tobacco Use   Smoking status: Some Days    Types: Cigars   Smokeless tobacco: Never  Vaping Use   Vaping Use: Never used  Substance and Sexual Activity   Alcohol use: Not Currently   Drug use: Never   Sexual activity: Not on file  Other Topics Concern   Not on file  Social History Narrative   Not on file   Social Determinants of Health   Financial Resource Strain: Not on file  Food Insecurity: Not on file  Transportation Needs: Not on file  Physical Activity: Not on file  Stress: Not on file  Social Connections: Not on file  Intimate Partner Violence:  Not on file    Past Surgical History:  Procedure Laterality Date   CARDIAC CATHETERIZATION     CAROTID PTA/STENT INTERVENTION Left 04/05/2021   Procedure: CAROTID PTA/STENT INTERVENTION;  Surgeon: Algernon Huxley, MD;  Location: [place] [facility];  Service: Cardiovascular;  Laterality: Left;   CHOLECYSTECTOMY     CORONARY ANGIOPLASTY     liver transplannt      Family History  Family history unknown: Yes    Allergies  Allergen Reactions   Levaquin [Levofloxacin]        Latest Ref Rng & Units 04/06/2021    4:57 AM 03/23/2021    6:19 PM  CBC  WBC 4.0 - 10.5 K/uL 9.2  11.1   Hemoglobin 13.0 - 17.0 g/dL 10.9  12.8   Hematocrit 39.0 - 52.0 % 34.2  41.2   Platelets 150 - 400 K/uL 248  273       CMP     Component Value Date/Time   NA 137 04/06/2021 0457   K 3.9 04/06/2021 0457   CL 106 04/06/2021 0457   CO2 25 04/06/2021 0457   GLUCOSE 174 (H) 04/06/2021 0457   BUN 13 04/06/2021 0457   CREATININE 1.03 04/06/2021 0457   CALCIUM 8.7 (L) 04/06/2021 0457   PROT 7.8  03/23/2021 1819   ALBUMIN 3.8 03/23/2021 1819   AST 20 03/23/2021 1819   ALT 13 03/23/2021 1819   ALKPHOS 64 03/23/2021 1819   BILITOT 0.5 03/23/2021 1819   GFRNONAA >60 04/06/2021 0457     No results found.     Assessment & Plan:   1. Atherosclerosis of native arteries of the extremities with ulceration ([place])  Recommend:  The patient has evidence of severe atherosclerotic changes of both lower extremities associated with ulceration and tissue loss of the right foot.  This represents a limb threatening ischemia and places the patient at the risk for right limb loss.  Patient should undergo angiography of the right lower extremity with the hope for intervention for limb salvage.  The risks and benefits as well as the alternative therapies was discussed in detail with the patient.  All questions were answered.  Patient agrees to proceed with right lower extremity angiography.  The patient will follow up with me in the office after the procedure.   2. Diabetes mellitus type 2 in nonobese [facility]) Continue hypoglycemic medications as already ordered, these medications have been reviewed and there are no changes at this time.  Hgb A1C to be monitored as already arranged by primary service  3. Essential hypertension Continue antihypertensive medications as already ordered, these medications have been reviewed and there are no changes at this time.   Current Outpatient Medications on File Prior to Visit  Medication Sig Dispense Refill   amoxicillin (AMOXIL) 500 MG capsule      atorvastatin (LIPITOR) 40 MG tablet Take 1 tablet (40 mg total) by mouth daily. 30 tablet 2   Calcium Carbonate-Vit D-Min (CALCIUM 600+D3 PLUS MINERALS) 600-800 MG-UNIT TABS Take 1 tablet by mouth 3 (three) times daily.     dapagliflozin propanediol (FARXIGA) 10 MG TABS tablet Take by mouth daily.     ELIQUIS 5 MG TABS tablet Take 5 mg by mouth 2 (two) times daily.     glipiZIDE (GLUCOTROL) 5 MG tablet Take 5 mg  by mouth 2 (two) times daily before a meal.     magnesium oxide (MAG-OX) 400 MG tablet Take 400 mg by mouth 2 (two) times daily.  metFORMIN (GLUCOPHAGE) 500 MG tablet Take 1,000 mg by mouth 2 (two) times daily.     metoprolol succinate (TOPROL-XL) 100 MG 24 hr tablet Take 100 mg by mouth daily.     mupirocin ointment (BACTROBAN) 2 % Apply 1 Application topically daily. 30 g 2   PROGRAF 0.5 MG capsule Take 0.5 mg by mouth 2 (two) times daily.     spironolactone (ALDACTONE) 25 MG tablet Take 25 mg by mouth daily.     clopidogrel (PLAVIX) 75 MG tablet Take 75 mg by mouth daily. (Patient not taking: Reported on 05/13/2022)     losartan (COZAAR) 25 MG tablet Take 1 tablet (25 mg total) by mouth daily. 30 tablet 1   metoprolol succinate (TOPROL XL) 25 MG 24 hr tablet Take 1 tablet (25 mg total) by mouth daily. (Patient taking differently: Take 100 mg by mouth daily.) 30 tablet 1   nitroGLYCERIN (NITROSTAT) 0.4 MG SL tablet Place under the tongue.     No current facility-administered medications on file prior to visit.    There are no Patient Instructions on file for this visit. No follow-ups on file.   Kris Hartmann, NP

## 2022-06-03 LAB — VAS US ABI WITH/WO TBI
Left ABI: 0.62
Right ABI: 0.66

## 2022-06-07 ENCOUNTER — Telehealth (INDEPENDENT_AMBULATORY_CARE_PROVIDER_SITE_OTHER): Payer: Self-pay

## 2022-06-07 NOTE — Telephone Encounter (Signed)
Spoke with the patient and he is scheduled with Dr. Lucky Cowboy on 06/20/22 with a 11:00 am arrival time to the [facility] for a RLE angio. Pre-procedure instructions were discussed and will sent to Mychart and be mailed.

## 2022-06-10 ENCOUNTER — Ambulatory Visit: Payer: Medicare Other | Admitting: Podiatry

## 2022-06-17 ENCOUNTER — Ambulatory Visit: Payer: Medicare Other | Admitting: Podiatry

## 2022-06-20 ENCOUNTER — Ambulatory Visit
Admission: RE | Admit: 2022-06-20 | Discharge: 2022-06-20 | Disposition: A | Discharge status: Home / Self Care | Payer: Medicare Other | Source: Ambulatory Visit | Attending: Vascular Surgery | Admitting: Vascular Surgery

## 2022-06-20 ENCOUNTER — Encounter
Admission: RE | Disposition: A | Discharge status: Home / Self Care | Payer: Self-pay | Source: Ambulatory Visit | Attending: Vascular Surgery

## 2022-06-20 ENCOUNTER — Other Ambulatory Visit: Payer: Self-pay

## 2022-06-20 ENCOUNTER — Encounter (INDEPENDENT_AMBULATORY_CARE_PROVIDER_SITE_OTHER): Payer: Self-pay | Admitting: Vascular Surgery

## 2022-06-20 ENCOUNTER — Encounter: Payer: Self-pay | Admitting: Vascular Surgery

## 2022-06-20 DIAGNOSIS — Z7984 Long term (current) use of oral hypoglycemic drugs: Secondary | ICD-10-CM | POA: Diagnosis not present

## 2022-06-20 DIAGNOSIS — I701 Atherosclerosis of renal artery: Secondary | ICD-10-CM | POA: Diagnosis not present

## 2022-06-20 DIAGNOSIS — I70235 Atherosclerosis of native arteries of right leg with ulceration of other part of foot: Secondary | ICD-10-CM | POA: Diagnosis not present

## 2022-06-20 DIAGNOSIS — I10 Essential (primary) hypertension: Secondary | ICD-10-CM | POA: Diagnosis not present

## 2022-06-20 DIAGNOSIS — L97909 Non-pressure chronic ulcer of unspecified part of unspecified lower leg with unspecified severity: Secondary | ICD-10-CM

## 2022-06-20 DIAGNOSIS — I70239 Atherosclerosis of native arteries of right leg with ulceration of unspecified site: Secondary | ICD-10-CM | POA: Diagnosis not present

## 2022-06-20 DIAGNOSIS — F17290 Nicotine dependence, other tobacco product, uncomplicated: Secondary | ICD-10-CM | POA: Insufficient documentation

## 2022-06-20 DIAGNOSIS — L97919 Non-pressure chronic ulcer of unspecified part of right lower leg with unspecified severity: Secondary | ICD-10-CM | POA: Diagnosis not present

## 2022-06-20 DIAGNOSIS — L97519 Non-pressure chronic ulcer of other part of right foot with unspecified severity: Secondary | ICD-10-CM | POA: Insufficient documentation

## 2022-06-20 DIAGNOSIS — E11621 Type 2 diabetes mellitus with foot ulcer: Secondary | ICD-10-CM | POA: Diagnosis present

## 2022-06-20 HISTORY — DX: Presence of automatic (implantable) cardiac defibrillator: Z95.810

## 2022-06-20 HISTORY — PX: LOWER EXTREMITY ANGIOGRAPHY: CATH118251

## 2022-06-20 LAB — BUN: BUN: 29 mg/dL — ABNORMAL HIGH (ref 8–23)

## 2022-06-20 LAB — GLUCOSE, CAPILLARY: Glucose-Capillary: 135 mg/dL — ABNORMAL HIGH (ref 70–99)

## 2022-06-20 LAB — CREATININE, SERUM
Creatinine, Ser: 1.21 mg/dL (ref 0.61–1.24)
GFR, Estimated: >60 mL/min (ref >60)

## 2022-06-20 SURGERY — LOWER EXTREMITY ANGIOGRAPHY
Anesthesia: Moderate Sedation | Site: Leg Lower | Laterality: Right

## 2022-06-20 MED ORDER — FENTANYL CITRATE (PF) 100 MCG/2ML IJ SOLN
INTRAMUSCULAR | Status: DC | PRN
  Administered 2022-06-20 (×2): 25 ug via INTRAVENOUS
  Administered 2022-06-20: 50 ug via INTRAVENOUS

## 2022-06-20 MED ORDER — MIDAZOLAM HCL 2 MG/2ML IJ SOLN
INTRAMUSCULAR | Status: AC
  Filled 2022-06-20: qty 2

## 2022-06-20 MED ORDER — SODIUM CHLORIDE 0.9% FLUSH: 3.0000 mL | INTRAVENOUS | Status: DC | PRN

## 2022-06-20 MED ORDER — CEFAZOLIN SODIUM-DEXTROSE 1-4 GM/50ML-% IV SOLN
INTRAVENOUS | Status: DC | PRN
  Administered 2022-06-20: 2 g via INTRAVENOUS

## 2022-06-20 MED ORDER — FENTANYL CITRATE PF 50 MCG/ML IJ SOSY
PREFILLED_SYRINGE | INTRAMUSCULAR | Status: AC
  Filled 2022-06-20: qty 1

## 2022-06-20 MED ORDER — SODIUM CHLORIDE 0.9% FLUSH: 3.0000 mL | Freq: Two times a day (BID) | INTRAVENOUS | Status: DC

## 2022-06-20 MED ORDER — LABETALOL HCL 5 MG/ML IV SOLN: 10.0000 mg | INTRAVENOUS | Status: DC | PRN

## 2022-06-20 MED ORDER — HYDROMORPHONE HCL 1 MG/ML IJ SOLN: 1.0000 mg | Freq: Once | INTRAMUSCULAR | Status: DC | PRN

## 2022-06-20 MED ORDER — SODIUM CHLORIDE 0.9 % IV SOLN: INTRAVENOUS | Status: DC

## 2022-06-20 MED ORDER — METHYLPREDNISOLONE SODIUM SUCC 125 MG IJ SOLR: 125.0000 mg | Freq: Once | INTRAMUSCULAR | Status: DC | PRN

## 2022-06-20 MED ORDER — HEPARIN SODIUM (PORCINE) 1000 UNIT/ML IJ SOLN
INTRAMUSCULAR | Status: AC
  Filled 2022-06-20: qty 10

## 2022-06-20 MED ORDER — SODIUM CHLORIDE 0.9 % IV SOLN: 250.0000 mL | INTRAVENOUS | Status: DC | PRN

## 2022-06-20 MED ORDER — HYDRALAZINE HCL 20 MG/ML IJ SOLN: 5.0000 mg | INTRAMUSCULAR | Status: DC | PRN

## 2022-06-20 MED ORDER — IODIXANOL 320 MG/ML IV SOLN
INTRAVENOUS | Status: DC | PRN
  Administered 2022-06-20: 65 mL

## 2022-06-20 MED ORDER — ACETAMINOPHEN 325 MG PO TABS: 650.0000 mg | ORAL_TABLET | ORAL | Status: DC | PRN

## 2022-06-20 MED ORDER — MIDAZOLAM HCL 2 MG/ML PO SYRP: 8.0000 mg | ORAL_SOLUTION | Freq: Once | ORAL | Status: DC | PRN

## 2022-06-20 MED ORDER — CLOPIDOGREL BISULFATE 75 MG PO TABS: 75.0000 mg | ORAL_TABLET | Freq: Every day | ORAL | Status: DC

## 2022-06-20 MED ORDER — CEFAZOLIN SODIUM-DEXTROSE 2-4 GM/100ML-% IV SOLN
INTRAVENOUS | Status: AC
  Filled 2022-06-20: qty 100

## 2022-06-20 MED ORDER — HEPARIN SODIUM (PORCINE) 1000 UNIT/ML IJ SOLN
INTRAMUSCULAR | Status: DC | PRN
  Administered 2022-06-20: 5000 [IU] via INTRAVENOUS

## 2022-06-20 MED ORDER — ONDANSETRON HCL 4 MG/2ML IJ SOLN: 4.0000 mg | Freq: Four times a day (QID) | INTRAMUSCULAR | Status: DC | PRN

## 2022-06-20 MED ORDER — DIPHENHYDRAMINE HCL 50 MG/ML IJ SOLN: 50.0000 mg | Freq: Once | INTRAMUSCULAR | Status: DC | PRN

## 2022-06-20 MED ORDER — FAMOTIDINE 20 MG PO TABS: 40.0000 mg | ORAL_TABLET | Freq: Once | ORAL | Status: DC | PRN

## 2022-06-20 MED ORDER — MIDAZOLAM HCL 2 MG/2ML IJ SOLN
INTRAMUSCULAR | Status: DC | PRN
  Administered 2022-06-20 (×2): 1 mg via INTRAVENOUS

## 2022-06-20 MED ORDER — CEFAZOLIN SODIUM-DEXTROSE 2-4 GM/100ML-% IV SOLN: 2.0000 g | INTRAVENOUS | Status: DC

## 2022-06-20 SURGICAL SUPPLY — 26 items
BALLN LUTONIX 018 4X100X130 (BALLOONS) ×1
BALLN LUTONIX 018 5X150X130 (BALLOONS) ×1
BALLN LUTONIX 018 5X300X130 (BALLOONS) ×2
BALLN ULTRVRSE 3X100X150 (BALLOONS) ×1
BALLOON LUTONIX 018 4X100X130 (BALLOONS) IMPLANT
BALLOON LUTONIX 018 5X150X130 (BALLOONS) IMPLANT
BALLOON LUTONIX 018 5X300X130 (BALLOONS) IMPLANT
BALLOON ULTRVRSE 3X100X150 (BALLOONS) IMPLANT
CATH ANGIO 5F PIGTAIL 65CM (CATHETERS) IMPLANT
CATH VERT 100CM (CATHETERS) IMPLANT
COVER PROBE ULTRASOUND 5X96 (MISCELLANEOUS) IMPLANT
DEVICE STARCLOSE SE CLOSURE (Vascular Products) IMPLANT
GLIDEWIRE ADV .035X260CM (WIRE) IMPLANT
GOWN STRL REUS W/ TWL XL LVL3 (GOWN DISPOSABLE) IMPLANT
GOWN STRL REUS W/TWL 2XL LVL3 (GOWN DISPOSABLE) IMPLANT
GOWN STRL REUS W/TWL XL LVL3 (GOWN DISPOSABLE) ×1
KIT ENCORE 26 ADVANTAGE (KITS) IMPLANT
PACK ANGIOGRAPHY (CUSTOM PROCEDURE TRAY) ×1 IMPLANT
SHEATH ANL2 6FRX45 HC (SHEATH) IMPLANT
SHEATH BRITE TIP 5FRX11 (SHEATH) IMPLANT
STENT VIABAHN 6X150X120 (Permanent Stent) IMPLANT
STENT VIABAHN 6X250X120 (Permanent Stent) IMPLANT
SYR MEDRAD MARK 7 150ML (SYRINGE) IMPLANT
TUBING CONTRAST HIGH PRESS 72 (TUBING) IMPLANT
WIRE G V18X300CM (WIRE) IMPLANT
WIRE GUIDERIGHT .035X150 (WIRE) IMPLANT

## 2022-06-20 NOTE — Interval H&P Note (Signed)
History and Physical Interval Note:  06/20/2022 11:10 AM  Dean King  has presented today for surgery, with the diagnosis of RLE Angio   BARD   ASO w ulceration.  The various methods of treatment have been discussed with the patient and family. After consideration of risks, benefits and other options for treatment, the patient has consented to  Procedure(s): Lower Extremity Angiography (Right) as a surgical intervention.  The patient's history has been reviewed, patient examined, no change in status, stable for surgery.  I have reviewed the patient's chart and labs.  Questions were answered to the patient's satisfaction.     [name]

## 2022-06-20 NOTE — Op Note (Signed)
[facility]  Percutaneous Study/Intervention Procedural Note   Date of Surgery: 06/20/2022  Surgeon(s):Sandralee Tarkington    Assistants:none  Pre-operative Diagnosis: PAD with ulceration RLE  Post-operative diagnosis:  Same  Procedure(s) Performed:             1.  Ultrasound guidance for vascular access left femoral artery             2.  Catheter placement into right common femoral artery from left femoral approach             3.  Aortogram and selective right lower extremity angiogram             4.  Percutaneous transluminal angioplasty of right posterior tibial artery with 3 mm diameter angioplasty balloon             5.  Percutaneous transluminal angioplasty of the right below-knee popliteal artery with a 4 mm diameter by 10 cm length Lutonix drug-coated angioplasty balloon  6.  Percutaneous transluminal angioplasty of the right SFA and proximal and mid popliteal arteries with 5 mm diameter Lutonix drug-coated angioplasty balloons  7.  Stent placement x 2 to the right SFA and popliteal arteries with a 6 mm diameter by 25 cm length and 6 mm diameter by 15 cm length Viabahn stent             8.  StarClose closure device left femoral artery  EBL: 10 cc  Contrast: 65 cc  Fluoro Time: 7.2 minutes  Moderate Conscious Sedation Time: approximately 59 minutes using 2 mg of Versed and 100 mcg of Fentanyl              Indications:  Patient is a 77 y.o.male with nonhealing ulceration of the right lower extremity with a marked reduction of his ABI. The patient has noninvasive study showing ABI of 0.6 range bilaterally. The patient is brought in for angiography for further evaluation and potential treatment.  Due to the limb threatening nature of the situation, angiogram was performed for attempted limb salvage. The patient is aware that if the procedure fails, amputation would be expected.  The patient also understands that even with successful revascularization, amputation may  still be required due to the severity of the situation.  Risks and benefits are discussed and informed consent is obtained.   Procedure:  The patient was identified and appropriate procedural time out was performed.  The patient was then placed supine on the table and prepped and draped in the usual sterile fashion. Moderate conscious sedation was administered during a face to face encounter with the patient throughout the procedure with my supervision of the RN administering medicines and monitoring the patient's vital signs, pulse oximetry, telemetry and mental status throughout from the start of the procedure until the patient was taken to the recovery room. Ultrasound was used to evaluate the left common femoral artery.  It was patent .  A digital ultrasound image was acquired.  A Seldinger needle was used to access the lef common femoral artery under direct ultrasound guidance and a permanent image was performed.  A 0.035 J wire was advanced without resistance and a 5Fr sheath was placed.  Pigtail catheter was placed into the aorta and an AP aortogram was performed. This demonstrated some disease in both renal arteries and normal aorta and iliac segments without significant stenosis. I then crossed the aortic bifurcation and advanced to the right femoral head. Selective right lower extremity angiogram was then performed. This  demonstrated a somewhat poor profunda femoris artery without great collateralization.  The SFA was diffusely diseased.  There is about an 80 to 90% stenosis in the proximal segment, 95% stenosis in the midsegment, 2 areas of greater than 70% stenosis in the distal segment and proximal popliteal artery, and then another 90% stenosis in the below-knee popliteal artery.  There was a typical tibial trifurcation.  The anterior tibial artery had stenosis in the 50 to 60% range in the proximal segment.  This was the smallest vessel with the least flow to the foot.  The peroneal artery was  patent and continuous distally.  The posterior tibial artery had about a 60 to 70% stenosis in the midsegment but was continuous to the foot. It was felt that it was in the patient's best interest to proceed with intervention after these images to avoid a second procedure and a larger amount of contrast and fluoroscopy based off of the findings from the initial angiogram. The patient was systemically heparinized and a 6 [place] Ansell sheath was then placed over the [employer] wire. I then used a Kumpe catheter and the advantage wire to cross the multiple areas of disease in the right SFA and popliteal artery get down into the posterior tibial artery.  I then exchanged for a V18 wire which I parked in the foot.  I then proceeded with treatment.  3 mm diameter by 10 cm length angioplasty balloon was inflated in the mid posterior tibial artery and taken to 8 atm for 1 minute.  The below-knee popliteal artery was treated with a 4 mm diameter by 10 cm length Lutonix drug-coated angioplasty balloon inflated to 8 atm for 1 minute.  The entirety of the SFA beginning about 4 to 5 cm beyond the origin down to the mid popliteal artery were then treated with a 5 mm diameter by 30 cm length and a 5 mm diameter by 15 cm length Lutonix drug-coated angioplasty balloon.  These inflations were 8 to 10 atm for 1 minute.  Completion imaging showed residual diffuse disease with multiple areas of greater than 50% stenosis in the SFA and popliteal arteries and so I elected to stent these areas.  The posterior tibial artery had about a 20 to 25% residual stenosis.  A 6 mm diameter by 25 cm length Viabahn stent and a 6 mm diameter by 15 cm length Viabahn stent were deployed from the below-knee popliteal artery up to the proximal SFA several centimeters below the origin of the SFA.  These were postdilated with 5 mm diameter Lutonix drug-coated angioplasty balloons with less than 10% residual stenosis after stent placement. I elected to  terminate the procedure. The sheath was removed and StarClose closure device was deployed in the left femoral artery with excellent hemostatic result. The patient was taken to the recovery room in stable condition having tolerated the procedure well.  Findings:               Aortogram:  This demonstrated some disease in both renal arteries and normal aorta and iliac segments without significant stenosis.             Right Lower Extremity:  This demonstrated a somewhat poor profunda femoris artery without great collateralization.  The SFA was diffusely diseased.  There is about an 80 to 90% stenosis in the proximal segment, 95% stenosis in the midsegment, 2 areas of greater than 70% stenosis in the distal segment and proximal popliteal artery, and then another 90% stenosis  in the below-knee popliteal artery.  There was a typical tibial trifurcation.  The anterior tibial artery had stenosis in the 50 to 60% range in the proximal segment.  This was the smallest vessel with the least flow to the foot.  The peroneal artery was patent and continuous distally.  The posterior tibial artery had about a 60 to 70% stenosis in the midsegment but was continuous to the foot.   Disposition: Patient was taken to the recovery room in stable condition having tolerated the procedure well.  Complications: None  Dean King 06/20/2022 1:58 PM   This note was created with Dragon Medical transcription system. Any errors in dictation are purely unintentional.

## 2022-06-21 ENCOUNTER — Encounter: Payer: Self-pay | Admitting: Podiatry

## 2022-06-21 MED ORDER — CEPHALEXIN 500 MG PO CAPS: 500.0000 mg | ORAL_CAPSULE | Freq: Four times a day (QID) | ORAL | 0 refills | Status: AC

## 2022-06-22 ENCOUNTER — Encounter (INDEPENDENT_AMBULATORY_CARE_PROVIDER_SITE_OTHER): Payer: Self-pay | Admitting: Vascular Surgery

## 2022-06-24 ENCOUNTER — Ambulatory Visit (INDEPENDENT_AMBULATORY_CARE_PROVIDER_SITE_OTHER): Payer: Medicare Other | Admitting: Podiatry

## 2022-06-24 DIAGNOSIS — L97512 Non-pressure chronic ulcer of other part of right foot with fat layer exposed: Secondary | ICD-10-CM | POA: Diagnosis not present

## 2022-06-24 NOTE — Progress Notes (Signed)
  Subjective:  Patient ID: Dean King, male    DOB: 03/16/1946,  MRN: [id number]  Chief Complaint  Patient presents with   Wound Check    "It's doing better, the last few days."    77 y.o. male presents with the above complaint. History confirmed with patient.  He underwent vascular intervention last week.  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  No cellulitis.      Assessment:   1. 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 Diabetes mellitus type 2 in nonobese [facility])      Plan:  Patient was evaluated and treated and all questions answered.   Ulcer right foot -We discussed the etiology and factors that are a part of the wound healing process.  We also discussed the risk of infection both soft tissue and osteomyelitis from open ulceration.  Discussed the risk of limb loss if this happens or worsens. -Debridement as below, utilized lidocaine ointment for topical anesthetic. -Dressed with Iodosorb, DSD. -Continue home dressing changes daily with bandaid-type dressing and mupirocin -Hopefully can begin to heal now that he has successful intervention.  Plan to transition to collagen dressing next visit  Procedure: Excisional Debridement of Wound Rationale: Removal of non-viable soft tissue from the wound to promote healing.  Anesthesia: none Post-Debridement Wound Measurements: Noted above Type of Debridement: Sharp Excisional Tissue Removed: Non-viable soft tissue Depth of  Debridement: subcutaneous tissue. Technique: Sharp excisional debridement to bleeding, viable wound base.  Dressing: Dry, sterile, compression dressing. Disposition: Patient tolerated procedure well.    Return in about 4 weeks (around 06/10/2022) for wound care.

## 2022-06-25 ENCOUNTER — Telehealth (INDEPENDENT_AMBULATORY_CARE_PROVIDER_SITE_OTHER): Payer: Self-pay

## 2022-06-25 NOTE — Telephone Encounter (Signed)
Per Dr. Lucky Cowboy in response to questions about taking Clopidogrel and Eliquis together and return visit.  Would take the Clopidigrel for at least three months after the procedure.  That is in addition to Eliquis.   April 25 is fine.  I would not normal say twelve days or see someone back that early.     This information was given to the patient.

## 2022-06-27 ENCOUNTER — Other Ambulatory Visit (INDEPENDENT_AMBULATORY_CARE_PROVIDER_SITE_OTHER): Payer: Self-pay

## 2022-06-27 ENCOUNTER — Telehealth (INDEPENDENT_AMBULATORY_CARE_PROVIDER_SITE_OTHER): Payer: Self-pay

## 2022-06-27 ENCOUNTER — Other Ambulatory Visit: Payer: Self-pay | Admitting: Podiatry

## 2022-06-27 MED ORDER — ELIQUIS 5 MG PO TABS: 5.0000 mg | ORAL_TABLET | Freq: Two times a day (BID) | ORAL | 5 refills | Status: AC

## 2022-06-27 NOTE — Telephone Encounter (Signed)
Patient's spouse called requesting a prescription for Eliquis called into the [facility] in [place]. This has been called in and a message left stating it had been taken care of.

## 2022-06-28 ENCOUNTER — Encounter (INDEPENDENT_AMBULATORY_CARE_PROVIDER_SITE_OTHER): Payer: Self-pay | Admitting: Vascular Surgery

## 2022-06-28 ENCOUNTER — Other Ambulatory Visit (INDEPENDENT_AMBULATORY_CARE_PROVIDER_SITE_OTHER): Payer: Self-pay

## 2022-06-28 MED ORDER — CLOPIDOGREL BISULFATE 75 MG PO TABS: 75.0000 mg | ORAL_TABLET | Freq: Every day | ORAL | 2 refills | Status: DC

## 2022-07-08 ENCOUNTER — Encounter: Payer: Self-pay | Admitting: Podiatry

## 2022-07-08 ENCOUNTER — Ambulatory Visit (INDEPENDENT_AMBULATORY_CARE_PROVIDER_SITE_OTHER): Payer: Medicare Other | Admitting: Podiatry

## 2022-07-08 VITALS — BP 119/59 | HR 75

## 2022-07-08 DIAGNOSIS — L97512 Non-pressure chronic ulcer of other part of right foot with fat layer exposed: Secondary | ICD-10-CM

## 2022-07-08 NOTE — Progress Notes (Signed)
  Subjective:  Patient ID: Dean King, male    DOB: January 13, 1946,  MRN: [id number]  Chief Complaint  Patient presents with   Wound Check    "It's not too good.  It's getting frustrating."    77 y.o. male presents with the above complaint. History confirmed with patient.  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  No cellulitis.      Assessment:   1. 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   Ulcer right foot -We discussed the etiology and factors that are a part of the wound healing process.  We also discussed the risk of infection both soft tissue and osteomyelitis from open ulceration.  Discussed the risk of limb loss if this happens or worsens. -Debridement as below, utilized lidocaine ointment for topical anesthetic. -Dressed with Prisma, DSD. -Begin daily changes with silver collagen matrix dressings, this was applied today  Procedure: Excisional Debridement of Wound Rationale: Removal of non-viable soft tissue from the wound to promote healing.  Anesthesia: none Post-Debridement Wound Measurements: Noted above Type of Debridement: Sharp Excisional Tissue Removed: Non-viable soft tissue Depth of Debridement: subcutaneous tissue. Technique: Sharp excisional debridement to bleeding, viable wound base.  Dressing: Dry, sterile, compression dressing. Disposition: Patient tolerated procedure well.    Return in about 3 weeks (around 07/29/2022) for wound care.

## 2022-07-17 ENCOUNTER — Other Ambulatory Visit (INDEPENDENT_AMBULATORY_CARE_PROVIDER_SITE_OTHER): Payer: Self-pay | Admitting: Vascular Surgery

## 2022-07-17 DIAGNOSIS — Z9889 Other specified postprocedural states: Secondary | ICD-10-CM

## 2022-07-18 ENCOUNTER — Telehealth (INDEPENDENT_AMBULATORY_CARE_PROVIDER_SITE_OTHER): Payer: Self-pay

## 2022-07-18 ENCOUNTER — Encounter (INDEPENDENT_AMBULATORY_CARE_PROVIDER_SITE_OTHER): Payer: Self-pay | Admitting: Nurse Practitioner

## 2022-07-18 ENCOUNTER — Ambulatory Visit (INDEPENDENT_AMBULATORY_CARE_PROVIDER_SITE_OTHER): Payer: Medicare Other | Admitting: Nurse Practitioner

## 2022-07-18 ENCOUNTER — Encounter (INDEPENDENT_AMBULATORY_CARE_PROVIDER_SITE_OTHER): Payer: Self-pay

## 2022-07-18 ENCOUNTER — Ambulatory Visit (INDEPENDENT_AMBULATORY_CARE_PROVIDER_SITE_OTHER): Payer: Medicare Other

## 2022-07-18 VITALS — BP 124/75 | HR 76 | Resp 18 | Ht 70.0 in | Wt 171.2 lb

## 2022-07-18 DIAGNOSIS — Z9889 Other specified postprocedural states: Secondary | ICD-10-CM

## 2022-07-18 DIAGNOSIS — I739 Peripheral vascular disease, unspecified: Secondary | ICD-10-CM

## 2022-07-18 DIAGNOSIS — E119 Type 2 diabetes mellitus without complications: Secondary | ICD-10-CM | POA: Diagnosis not present

## 2022-07-18 DIAGNOSIS — I7025 Atherosclerosis of native arteries of other extremities with ulceration: Secondary | ICD-10-CM | POA: Diagnosis not present

## 2022-07-18 DIAGNOSIS — I10 Essential (primary) hypertension: Secondary | ICD-10-CM | POA: Diagnosis not present

## 2022-07-18 NOTE — Telephone Encounter (Signed)
Patient is scheduled for right leg angio with Dr Wyn Quaker on 07/25/2022 at [facility] arrival time at 1:30 pm. I left a message on patient voicemail to return call to the office.

## 2022-07-19 LAB — VAS US ABI WITH/WO TBI
Left ABI: 0.59
Right ABI: 0.55

## 2022-07-19 NOTE — Telephone Encounter (Signed)
I spoke with patient and pre-procedure instructions were gone over and will be mailed out

## 2022-07-22 ENCOUNTER — Encounter: Payer: Self-pay | Admitting: Podiatry

## 2022-07-22 ENCOUNTER — Encounter (INDEPENDENT_AMBULATORY_CARE_PROVIDER_SITE_OTHER): Payer: Self-pay | Admitting: Vascular Surgery

## 2022-07-22 ENCOUNTER — Telehealth: Payer: Self-pay | Admitting: *Deleted

## 2022-07-22 MED ORDER — CEPHALEXIN 500 MG PO CAPS: 500.0000 mg | ORAL_CAPSULE | Freq: Three times a day (TID) | ORAL | 0 refills | Status: AC

## 2022-07-22 NOTE — Telephone Encounter (Signed)
Wife is calling because [place] and patient's foot is red ,bleeding , sore to touch, may be infected again, is requesting that [facility] [place]-[phone], please advise.

## 2022-07-22 NOTE — Telephone Encounter (Signed)
Patient has been updated.

## 2022-07-24 ENCOUNTER — Encounter (INDEPENDENT_AMBULATORY_CARE_PROVIDER_SITE_OTHER): Payer: Self-pay | Admitting: Nurse Practitioner

## 2022-07-24 NOTE — H&P (View-Only) (Signed)
 Subjective:    Patient ID: Dean King, male    DOB: 12/31/1945, 77 y.o.   MRN: [id number] Chief Complaint  Patient presents with   Follow-up    Follow up 4 weeks (07/18/2022); with ABIs    The patient returns to the office for followup and review status post angiogram with intervention on 06/20/2022.   Procedure: Procedure(s) Performed:             1.  Ultrasound guidance for vascular access left femoral artery             2.  Catheter placement into right common femoral artery from left femoral approach             3.  Aortogram and selective right lower extremity angiogram             4.  Percutaneous transluminal angioplasty of right posterior tibial artery with 3 mm diameter angioplasty balloon             5.  Percutaneous transluminal angioplasty of the right below-knee popliteal artery with a 4 mm diameter by 10 cm length Lutonix drug-coated angioplasty balloon             6.  Percutaneous transluminal angioplasty of the right SFA and proximal and mid popliteal arteries with 5 mm diameter Lutonix drug-coated angioplasty balloons             7.  Stent placement x 2 to the right SFA and popliteal arteries with a 6 mm diameter by 25 cm length and 6 mm diameter by 15 cm length Viabahn stent             8.  StarClose closure device left femoral artery   The patient notes there has not been any significant improvement in his right lower extremity ulceration.  He notes that it is sore and painful and not making any meaningful progress. There have been no significant changes to the patient's overall health care.  No documented history of amaurosis fugax or recent TIA symptoms. There are no recent neurological changes noted. No documented history of DVT, PE or superficial thrombophlebitis. The patient denies recent episodes of angina or shortness of breath.   ABI's Rt=0.55 and Lt=0.59  (previous ABI's Rt=0.66 and Lt=0.62) Additional duplex of the right lower extremity shows an  occluded SFA stent.    Review of Systems  Skin:  Positive for wound.  All other systems reviewed and are negative.      Objective:   Physical Exam Vitals reviewed.  HENT:     Head: Normocephalic.  Cardiovascular:     Rate and Rhythm: Normal rate.     Pulses:          Dorsalis pedis pulses are detected w/ Doppler on the right side and detected w/ Doppler on the left side.       Posterior tibial pulses are detected w/ Doppler on the right side and detected w/ Doppler on the left side.  Pulmonary:     Effort: Pulmonary effort is normal.  Skin:    General: Skin is dry.     Capillary Refill: Capillary refill takes more than 3 seconds.  Neurological:     Mental Status: He is alert and oriented to person, place, and time.  Psychiatric:        Mood and Affect: Mood normal.        Behavior: Behavior normal.        Thought Content: Thought content   normal.        Judgment: Judgment normal.     BP 124/75 (BP Location: Left Arm)   Pulse 76   Resp 18   Ht 5' 10" (1.778 m)   Wt 171 lb 3.2 oz (77.7 kg)   BMI 24.56 kg/m   Past Medical History:  Diagnosis Date   AICD (automatic cardioverter/defibrillator) present    Diabetes mellitus without complication (HCC)    Hypertension     Social History   Socioeconomic History   Marital status: Married    Spouse name: Not on file   Number of children: Not on file   Years of education: Not on file   Highest education level: Not on file  Occupational History   Not on file  Tobacco Use   Smoking status: Some Days    Types: Cigars   Smokeless tobacco: Never  Vaping Use   Vaping Use: Never used  Substance and Sexual Activity   Alcohol use: Not Currently   Drug use: Never   Sexual activity: Not on file  Other Topics Concern   Not on file  Social History Narrative   Not on file   Social Determinants of Health   Financial Resource Strain: Not on file  Food Insecurity: Not on file  Transportation Needs: Not on file   Physical Activity: Not on file  Stress: Not on file  Social Connections: Not on file  Intimate Partner Violence: Not on file    Past Surgical History:  Procedure Laterality Date   ABLATION  2023   CARDIAC CATHETERIZATION     CAROTID PTA/STENT INTERVENTION Left 04/05/2021   Procedure: CAROTID PTA/STENT INTERVENTION;  Surgeon: Dew, Jason S, MD;  Location: [facility];  Service: Cardiovascular;  Laterality: Left;   CHOLECYSTECTOMY     CORONARY ANGIOPLASTY     liver transplannt     LOWER EXTREMITY ANGIOGRAPHY Right 06/20/2022   Procedure: Lower Extremity Angiography;  Surgeon: Dew, Jason S, MD;  Location: [facility];  Service: Cardiovascular;  Laterality: Right;    Family History  Family history unknown: Yes    Allergies  Allergen Reactions   Levaquin [Levofloxacin]        Latest Ref Rng & Units 04/06/2021    4:57 AM 03/23/2021    6:19 PM  CBC  WBC 4.0 - 10.5 K/uL 9.2  11.1   Hemoglobin 13.0 - 17.0 g/dL 10.9  12.8   Hematocrit 39.0 - 52.0 % 34.2  41.2   Platelets 150 - 400 K/uL 248  273       CMP     Component Value Date/Time   NA 137 04/06/2021 0457   K 3.9 04/06/2021 0457   CL 106 04/06/2021 0457   CO2 25 04/06/2021 0457   GLUCOSE 174 (H) 04/06/2021 0457   BUN 29 (H) 06/20/2022 1134   CREATININE 1.21 06/20/2022 1134   CALCIUM 8.7 (L) 04/06/2021 0457   PROT 7.8 03/23/2021 1819   ALBUMIN 3.8 03/23/2021 1819   AST 20 03/23/2021 1819   ALT 13 03/23/2021 1819   ALKPHOS 64 03/23/2021 1819   BILITOT 0.5 03/23/2021 1819   GFRNONAA >60 06/20/2022 1134     VAS US ABI WITH/WO TBI  Result Date: 07/19/2022  LOWER EXTREMITY DOPPLER STUDY Patient Name:  Dean King  Date of Exam:   07/18/2022 Medical Rec #: [id number]      Accession #:    [id number] Date of Birth: 01/14/1946, 77 years old      Patient   Gender: M Patient Age:   77 years Exam Location:  [facility] Procedure:      VAS US ABI WITH/WO TBI Referring Phys: JASON DEW  --------------------------------------------------------------------------------  Indications: Ulceration, and peripheral artery disease. High Risk         Hypertension, Diabetes, current smoker, coronary artery Factors:          disease.  Performing Technologist: Matthew Cravey RVT  Examination Guidelines: A complete evaluation includes at minimum, Doppler waveform signals and systolic blood pressure reading at the level of bilateral brachial, anterior tibial, and posterior tibial arteries, when vessel segments are accessible. Bilateral testing is considered an integral part of a complete examination. Photoelectric Plethysmograph (PPG) waveforms and toe systolic pressure readings are included as required and additional duplex testing as needed. Limited examinations for reoccurring indications may be performed as noted.  ABI Findings: +---------+------------------+-----+----------+--------+ Right    Rt Pressure (mmHg)IndexWaveform  Comment  +---------+------------------+-----+----------+--------+ Brachial 127                                       +---------+------------------+-----+----------+--------+ PTA      70                0.55 monophasic         +---------+------------------+-----+----------+--------+ DP       52                0.41 monophasic         +---------+------------------+-----+----------+--------+ Great Toe9                 0.07                    +---------+------------------+-----+----------+--------+ +---------+------------------+-----+----------+-------+ Left     Lt Pressure (mmHg)IndexWaveform  Comment +---------+------------------+-----+----------+-------+ Brachial 127                                      +---------+------------------+-----+----------+-------+ PTA      75                0.59 monophasic        +---------+------------------+-----+----------+-------+ DP       75                0.59 monophasic         +---------+------------------+-----+----------+-------+ Great Toe10                0.08                   +---------+------------------+-----+----------+-------+ +-------+-----------+-----------+------------+------------+ ABI/TBIToday's ABIToday's TBIPrevious ABIPrevious TBI +-------+-----------+-----------+------------+------------+ Right  0.55       0.07       0.66        0.38         +-------+-----------+-----------+------------+------------+ Left   0.59       0.08       0.62        0.33         +-------+-----------+-----------+------------+------------+  Limited imaging showed occluded right SFA stent and occluded left SFA. Bilateral and Right ABIs appear decreased compared to prior study on 05/29/2022. Left ABIs appear essentially unchanged compared to prior study on 05/29/2022.  Summary: Right: Resting right ankle-brachial index indicates moderate right lower extremity arterial disease. The right toe-brachial index is abnormal. Limited imaging showed occluded SFA stent.   Left: Resting left ankle-brachial index indicates moderate left lower extremity arterial disease. The left toe-brachial index is abnormal. *See table(s) above for measurements and observations.  Electronically signed by Jason Dew MD on 07/19/2022 at 11:00:38 AM.    Final    VAS US ABI WITH/WO TBI  Result Date: 06/03/2022  LOWER EXTREMITY DOPPLER STUDY Patient Name:  Jaking D Crays  Date of Exam:   05/28/2022 Medical Rec #: [id number]      Accession #:    [id number] Date of Birth: 04/15/1945      Patient Gender: M Patient Age:   77 years Exam Location:  [facility] Procedure:      VAS US ABI WITH/WO TBI Referring Phys: Jason Dew --------------------------------------------------------------------------------  Indications: Ulceration.  Performing Technologist: Solomon Mcclary RVS  Examination Guidelines: A complete evaluation includes at minimum, Doppler waveform signals and systolic blood pressure reading at  the level of bilateral brachial, anterior tibial, and posterior tibial arteries, when vessel segments are accessible. Bilateral testing is considered an integral part of a complete examination. Photoelectric Plethysmograph (PPG) waveforms and toe systolic pressure readings are included as required and additional duplex testing as needed. Limited examinations for reoccurring indications may be performed as noted.  ABI Findings: +---------+------------------+-----+----------+--------+ Right    Rt Pressure (mmHg)IndexWaveform  Comment  +---------+------------------+-----+----------+--------+ Brachial 128                                       +---------+------------------+-----+----------+--------+ ATA      74                0.57 monophasic         +---------+------------------+-----+----------+--------+ PTA      86                0.66 monophasic         +---------+------------------+-----+----------+--------+ Great Toe50                0.38 Abnormal           +---------+------------------+-----+----------+--------+ +---------+------------------+-----+----------+-------+ Left     Lt Pressure (mmHg)IndexWaveform  Comment +---------+------------------+-----+----------+-------+ Brachial 130                                      +---------+------------------+-----+----------+-------+ ATA      77                0.59 monophasic        +---------+------------------+-----+----------+-------+ PTA      81                0.62 monophasic        +---------+------------------+-----+----------+-------+ Great Toe43                0.33 Abnormal          +---------+------------------+-----+----------+-------+ +-------+-----------+-----------+------------+------------+ ABI/TBIToday's ABIToday's TBIPrevious ABIPrevious TBI +-------+-----------+-----------+------------+------------+ Right  .66        .38                                  +-------+-----------+-----------+------------+------------+ Left   .62        .33                                 +-------+-----------+-----------+------------+------------+    Summary: Right: Resting right ankle-brachial index indicates moderate right lower extremity arterial disease. The right toe-brachial index is abnormal. Left: Resting left ankle-brachial index indicates moderate left lower extremity arterial disease. The left toe-brachial index is abnormal. *See table(s) above for measurements and observations.  Electronically signed by Jason Dew MD on 06/03/2022 at 8:23:36 AM.    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  This represents a limb threatening ischemia and places the patient at the risk for right limb loss.  Patient should undergo angiography of the right lower extremity with the hope for intervention for limb salvage.  The risks and benefits as well as the alternative therapies was discussed in detail with the patient.  All questions were answered.  Patient agrees to proceed with right angiography.  The patient will follow up with me in the office after the procedure.   2. Essential hypertension Continue antihypertensive medications as already ordered, these medications have been reviewed and there are no changes at this time.  3. Type 2 diabetes mellitus without complication, without long-term current use of insulin (HCC) Continue hypoglycemic medications as already ordered, these medications have been reviewed and there are no changes at this time.  Hgb A1C to be monitored as already arranged by primary service   Current Outpatient Medications on File Prior to Visit  Medication Sig Dispense Refill   atorvastatin (LIPITOR) 40 MG tablet  Take 1 tablet (40 mg total) by mouth daily. 30 tablet 2   Calcium Carbonate-Vit D-Min (CALCIUM 600+D3 PLUS MINERALS) 600-800 MG-UNIT TABS Take 1 tablet by mouth 3 (three) times daily.     clopidogrel (PLAVIX) 75 MG tablet Take 1 tablet (75 mg total) by mouth daily. 30 tablet 2   dapagliflozin propanediol (FARXIGA) 10 MG TABS tablet Take by mouth daily.     ELIQUIS 5 MG TABS tablet Take 1 tablet (5 mg total) by mouth 2 (two) times daily. 60 tablet 5   glipiZIDE (GLUCOTROL) 5 MG tablet Take 5 mg by mouth 2 (two) times daily before a meal.     losartan (COZAAR) 25 MG tablet Take 1 tablet (25 mg total) by mouth daily. 30 tablet 1   magnesium oxide (MAG-OX) 400 MG tablet Take 400 mg by mouth 2 (two) times daily.     metFORMIN (GLUCOPHAGE) 500 MG tablet Take 1,000 mg by mouth 2 (two) times daily.     metoprolol succinate (TOPROL-XL) 100 MG 24 hr tablet Take 150 mg by mouth daily.     nitroGLYCERIN (NITROSTAT) 0.4 MG SL tablet Place 0.4 mg under the tongue as needed.     PROGRAF 0.5 MG capsule Take 0.5 mg by mouth 2 (two) times daily.     spironolactone (ALDACTONE) 25 MG tablet Take 25 mg by mouth daily.     metoprolol succinate (TOPROL XL) 25 MG 24 hr tablet Take 1 tablet (25 mg total) by mouth daily. (Patient taking differently: Take 100 mg by mouth daily.) 30 tablet 1   mupirocin ointment (BACTROBAN) 2 % Apply 1 Application topically daily. (Patient not taking: Reported on 07/18/2022) 30 g 2   No current facility-administered medications on file prior to visit.    There are no Patient Instructions on file for this visit. No follow-ups on file.     Reason Helzer E Ryhanna Dunsmore, NP   

## 2022-07-24 NOTE — Progress Notes (Signed)
Subjective:    Patient ID: Dean King, male    DOB: 23-Nov-1945, 77 y.o.   MRN: [id number] Chief Complaint  Patient presents with   Follow-up    Follow up 4 weeks (07/18/2022); with ABIs    The patient returns to the office for followup and review status post angiogram with intervention on 06/20/2022.   Procedure: Procedure(s) Performed:             1.  Ultrasound guidance for vascular access left femoral artery             2.  Catheter placement into right common femoral artery from left femoral approach             3.  Aortogram and selective right lower extremity angiogram             4.  Percutaneous transluminal angioplasty of right posterior tibial artery with 3 mm diameter angioplasty balloon             5.  Percutaneous transluminal angioplasty of the right below-knee popliteal artery with a 4 mm diameter by 10 cm length Lutonix drug-coated angioplasty balloon             6.  Percutaneous transluminal angioplasty of the right SFA and proximal and mid popliteal arteries with 5 mm diameter Lutonix drug-coated angioplasty balloons             7.  Stent placement x 2 to the right SFA and popliteal arteries with a 6 mm diameter by 25 cm length and 6 mm diameter by 15 cm length Viabahn stent             8.  StarClose closure device left femoral artery   The patient notes there has not been any significant improvement in his right lower extremity ulceration.  He notes that it is sore and painful and not making any meaningful progress. There have been no significant changes to the patient's overall health care.  No documented history of amaurosis fugax or recent TIA symptoms. There are no recent neurological changes noted. No documented history of DVT, PE or superficial thrombophlebitis. The patient denies recent episodes of angina or shortness of breath.   ABI's Rt=0.55 and Lt=0.59  (previous ABI's Rt=0.66 and Lt=0.62) Additional duplex of the right lower extremity shows an  occluded SFA stent.    Review of Systems  Skin:  Positive for wound.  All other systems reviewed and are negative.      Objective:   Physical Exam Vitals reviewed.  HENT:     Head: Normocephalic.  Cardiovascular:     Rate and Rhythm: Normal rate.     Pulses:          Dorsalis pedis pulses are detected w/ Doppler on the right side and detected w/ Doppler on the left side.       Posterior tibial pulses are detected w/ Doppler on the right side and detected w/ Doppler on the left side.  Pulmonary:     Effort: Pulmonary effort is normal.  Skin:    General: Skin is dry.     Capillary Refill: Capillary refill takes more than 3 seconds.  Neurological:     Mental Status: He is alert and oriented to person, place, and time.  Psychiatric:        Mood and Affect: Mood normal.        Behavior: Behavior normal.        Thought Content: Thought content  normal.        Judgment: Judgment normal.     BP 124/75 (BP Location: Left Arm)   Pulse 76   Resp 18   Ht 5\' 10"  (1.778 m)   Wt 171 lb 3.2 oz (77.7 kg)   BMI 24.56 kg/m   Past Medical History:  Diagnosis Date   AICD (automatic cardioverter/defibrillator) present    Diabetes mellitus without complication (HCC)    Hypertension     Social History   Socioeconomic History   Marital status: Married    Spouse name: Not on file   Number of children: Not on file   Years of education: Not on file   Highest education level: Not on file  Occupational History   Not on file  Tobacco Use   Smoking status: Some Days    Types: Cigars   Smokeless tobacco: Never  Vaping Use   Vaping Use: Never used  Substance and Sexual Activity   Alcohol use: Not Currently   Drug use: Never   Sexual activity: Not on file  Other Topics Concern   Not on file  Social History Narrative   Not on file   Social Determinants of Health   Financial Resource Strain: Not on file  Food Insecurity: Not on file  Transportation Needs: Not on file   Physical Activity: Not on file  Stress: Not on file  Social Connections: Not on file  Intimate Partner Violence: Not on file    Past Surgical History:  Procedure Laterality Date   ABLATION  2023   CARDIAC CATHETERIZATION     CAROTID PTA/STENT INTERVENTION Left 04/05/2021   Procedure: CAROTID PTA/STENT INTERVENTION;  Surgeon: Annice Needy, MD;  Location: [facility];  Service: Cardiovascular;  Laterality: Left;   CHOLECYSTECTOMY     CORONARY ANGIOPLASTY     liver transplannt     LOWER EXTREMITY ANGIOGRAPHY Right 06/20/2022   Procedure: Lower Extremity Angiography;  Surgeon: Annice Needy, MD;  Location: [facility];  Service: Cardiovascular;  Laterality: Right;    Family History  Family history unknown: Yes    Allergies  Allergen Reactions   Levaquin [Levofloxacin]        Latest Ref Rng & Units 04/06/2021    4:57 AM 03/23/2021    6:19 PM  CBC  WBC 4.0 - 10.5 K/uL 9.2  11.1   Hemoglobin 13.0 - 17.0 g/dL 96.0  45.4   Hematocrit 39.0 - 52.0 % 34.2  41.2   Platelets 150 - 400 K/uL 248  273       CMP     Component Value Date/Time   NA 137 04/06/2021 0457   K 3.9 04/06/2021 0457   CL 106 04/06/2021 0457   CO2 25 04/06/2021 0457   GLUCOSE 174 (H) 04/06/2021 0457   BUN 29 (H) 06/20/2022 1134   CREATININE 1.21 06/20/2022 1134   CALCIUM 8.7 (L) 04/06/2021 0457   PROT 7.8 03/23/2021 1819   ALBUMIN 3.8 03/23/2021 1819   AST 20 03/23/2021 1819   ALT 13 03/23/2021 1819   ALKPHOS 64 03/23/2021 1819   BILITOT 0.5 03/23/2021 1819   GFRNONAA >60 06/20/2022 1134     VAS [place] ABI WITH/WO TBI  Result Date: 07/19/2022  LOWER EXTREMITY DOPPLER STUDY Patient Name:  Dean King  Date of Exam:   07/18/2022 Medical Rec #: [id number]      Accession #:    [id number] Date of Birth: 06-15-1945      Patient  Gender: M Patient Age:   51 years Exam Location:  [facility] Procedure:      VAS [place] ABI WITH/WO TBI Referring Phys: JASON DEW  --------------------------------------------------------------------------------  Indications: Ulceration, and peripheral artery disease. High Risk         Hypertension, Diabetes, current smoker, coronary artery Factors:          disease.  Performing Technologist: Hardie Lora RVT  Examination Guidelines: A complete evaluation includes at minimum, Doppler waveform signals and systolic blood pressure reading at the level of bilateral brachial, anterior tibial, and posterior tibial arteries, when vessel segments are accessible. Bilateral testing is considered an integral part of a complete examination. Photoelectric Plethysmograph (PPG) waveforms and toe systolic pressure readings are included as required and additional duplex testing as needed. Limited examinations for reoccurring indications may be performed as noted.  ABI Findings: +---------+------------------+-----+----------+--------+ Right    Rt Pressure (mmHg)IndexWaveform  Comment  +---------+------------------+-----+----------+--------+ Brachial 127                                       +---------+------------------+-----+----------+--------+ PTA      70                0.55 monophasic         +---------+------------------+-----+----------+--------+ DP       52                0.41 monophasic         +---------+------------------+-----+----------+--------+ Great Toe9                 0.07                    +---------+------------------+-----+----------+--------+ +---------+------------------+-----+----------+-------+ Left     Lt Pressure (mmHg)IndexWaveform  Comment +---------+------------------+-----+----------+-------+ Brachial 127                                      +---------+------------------+-----+----------+-------+ PTA      75                0.59 monophasic        +---------+------------------+-----+----------+-------+ DP       75                0.59 monophasic         +---------+------------------+-----+----------+-------+ Great Toe10                0.08                   +---------+------------------+-----+----------+-------+ +-------+-----------+-----------+------------+------------+ ABI/TBIToday's ABIToday's TBIPrevious ABIPrevious TBI +-------+-----------+-----------+------------+------------+ Right  0.55       0.07       0.66        0.38         +-------+-----------+-----------+------------+------------+ Left   0.59       0.08       0.62        0.33         +-------+-----------+-----------+------------+------------+  Limited imaging showed occluded right SFA stent and occluded left SFA. Bilateral and Right ABIs appear decreased compared to prior study on 05/29/2022. Left ABIs appear essentially unchanged compared to prior study on 05/29/2022.  Summary: Right: Resting right ankle-brachial index indicates moderate right lower extremity arterial disease. The right toe-brachial index is abnormal. Limited imaging showed occluded SFA stent.  Left: Resting left ankle-brachial index indicates moderate left lower extremity arterial disease. The left toe-brachial index is abnormal. *See table(s) above for measurements and observations.  Electronically signed by [name] MD on 07/19/2022 at 11:00:38 AM.    Final    VAS [place] ABI WITH/WO TBI  Result Date: 06/03/2022  LOWER EXTREMITY DOPPLER STUDY Patient Name:  ARTEMIO DOBIE  Date of Exam:   05/28/2022 Medical Rec #: [id number]      Accession #:    [id number] Date of Birth: 06-06-1945      Patient Gender: M Patient Age:   71 years Exam Location:  [facility] Procedure:      VAS [place] ABI WITH/WO TBI Referring Phys: [name] --------------------------------------------------------------------------------  Indications: Ulceration.  Performing Technologist: Debbe Bales RVS  Examination Guidelines: A complete evaluation includes at minimum, Doppler waveform signals and systolic blood pressure reading at  the level of bilateral brachial, anterior tibial, and posterior tibial arteries, when vessel segments are accessible. Bilateral testing is considered an integral part of a complete examination. Photoelectric Plethysmograph (PPG) waveforms and toe systolic pressure readings are included as required and additional duplex testing as needed. Limited examinations for reoccurring indications may be performed as noted.  ABI Findings: +---------+------------------+-----+----------+--------+ Right    Rt Pressure (mmHg)IndexWaveform  Comment  +---------+------------------+-----+----------+--------+ Brachial 128                                       +---------+------------------+-----+----------+--------+ ATA      74                0.57 monophasic         +---------+------------------+-----+----------+--------+ PTA      86                0.66 monophasic         +---------+------------------+-----+----------+--------+ Great Toe50                0.38 Abnormal           +---------+------------------+-----+----------+--------+ +---------+------------------+-----+----------+-------+ Left     Lt Pressure (mmHg)IndexWaveform  Comment +---------+------------------+-----+----------+-------+ Brachial 130                                      +---------+------------------+-----+----------+-------+ ATA      77                0.59 monophasic        +---------+------------------+-----+----------+-------+ PTA      81                0.62 monophasic        +---------+------------------+-----+----------+-------+ Great Toe43                0.33 Abnormal          +---------+------------------+-----+----------+-------+ +-------+-----------+-----------+------------+------------+ ABI/TBIToday's ABIToday's TBIPrevious ABIPrevious TBI +-------+-----------+-----------+------------+------------+ Right  .66        .38                                  +-------+-----------+-----------+------------+------------+ Left   .62        .33                                 +-------+-----------+-----------+------------+------------+  Summary: Right: Resting right ankle-brachial index indicates moderate right lower extremity arterial disease. The right toe-brachial index is abnormal. Left: Resting left ankle-brachial index indicates moderate left lower extremity arterial disease. The left toe-brachial index is abnormal. *See table(s) above for measurements and observations.  Electronically signed by [name] MD on 06/03/2022 at 8:23:36 AM.    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  This represents a limb threatening ischemia and places the patient at the risk for right limb loss.  Patient should undergo angiography of the right lower extremity with the hope for intervention for limb salvage.  The risks and benefits as well as the alternative therapies was discussed in detail with the patient.  All questions were answered.  Patient agrees to proceed with right angiography.  The patient will follow up with me in the office after the procedure.   2. Essential hypertension Continue antihypertensive medications as already ordered, these medications have been reviewed and there are no changes at this time.  3. Type 2 diabetes mellitus without complication, without long-term current use of insulin (HCC) Continue hypoglycemic medications as already ordered, these medications have been reviewed and there are no changes at this time.  Hgb A1C to be monitored as already arranged by primary service   Current Outpatient Medications on File Prior to Visit  Medication Sig Dispense Refill   atorvastatin (LIPITOR) 40 MG tablet  Take 1 tablet (40 mg total) by mouth daily. 30 tablet 2   Calcium Carbonate-Vit D-Min (CALCIUM 600+D3 PLUS MINERALS) 600-800 MG-UNIT TABS Take 1 tablet by mouth 3 (three) times daily.     clopidogrel (PLAVIX) 75 MG tablet Take 1 tablet (75 mg total) by mouth daily. 30 tablet 2   dapagliflozin propanediol (FARXIGA) 10 MG TABS tablet Take by mouth daily.     ELIQUIS 5 MG TABS tablet Take 1 tablet (5 mg total) by mouth 2 (two) times daily. 60 tablet 5   glipiZIDE (GLUCOTROL) 5 MG tablet Take 5 mg by mouth 2 (two) times daily before a meal.     losartan (COZAAR) 25 MG tablet Take 1 tablet (25 mg total) by mouth daily. 30 tablet 1   magnesium oxide (MAG-OX) 400 MG tablet Take 400 mg by mouth 2 (two) times daily.     metFORMIN (GLUCOPHAGE) 500 MG tablet Take 1,000 mg by mouth 2 (two) times daily.     metoprolol succinate (TOPROL-XL) 100 MG 24 hr tablet Take 150 mg by mouth daily.     nitroGLYCERIN (NITROSTAT) 0.4 MG SL tablet Place 0.4 mg under the tongue as needed.     PROGRAF 0.5 MG capsule Take 0.5 mg by mouth 2 (two) times daily.     spironolactone (ALDACTONE) 25 MG tablet Take 25 mg by mouth daily.     metoprolol succinate (TOPROL XL) 25 MG 24 hr tablet Take 1 tablet (25 mg total) by mouth daily. (Patient taking differently: Take 100 mg by mouth daily.) 30 tablet 1   mupirocin ointment (BACTROBAN) 2 % Apply 1 Application topically daily. (Patient not taking: Reported on 07/18/2022) 30 g 2   No current facility-administered medications on file prior to visit.    There are no Patient Instructions on file for this visit. No follow-ups on file.  Kris Hartmann, NP

## 2022-07-25 ENCOUNTER — Inpatient Hospital Stay
Admission: RE | Admit: 2022-07-25 | Discharge: 2022-07-27 | DRG: 272 | Disposition: A | Discharge status: Home / Self Care | Payer: Medicare Other | Attending: Vascular Surgery | Admitting: Vascular Surgery

## 2022-07-25 ENCOUNTER — Other Ambulatory Visit: Payer: Self-pay

## 2022-07-25 ENCOUNTER — Encounter
Admission: RE | Disposition: A | Discharge status: Home / Self Care | Payer: Self-pay | Source: Home / Self Care | Attending: Vascular Surgery

## 2022-07-25 ENCOUNTER — Encounter: Payer: Self-pay | Admitting: Vascular Surgery

## 2022-07-25 DIAGNOSIS — I743 Embolism and thrombosis of arteries of the lower extremities: Secondary | ICD-10-CM | POA: Diagnosis not present

## 2022-07-25 DIAGNOSIS — Z7901 Long term (current) use of anticoagulants: Secondary | ICD-10-CM

## 2022-07-25 DIAGNOSIS — I998 Other disorder of circulatory system: Secondary | ICD-10-CM | POA: Diagnosis present

## 2022-07-25 DIAGNOSIS — Z7902 Long term (current) use of antithrombotics/antiplatelets: Secondary | ICD-10-CM

## 2022-07-25 DIAGNOSIS — F17290 Nicotine dependence, other tobacco product, uncomplicated: Secondary | ICD-10-CM | POA: Diagnosis present

## 2022-07-25 DIAGNOSIS — Z7984 Long term (current) use of oral hypoglycemic drugs: Secondary | ICD-10-CM | POA: Diagnosis not present

## 2022-07-25 DIAGNOSIS — I70299 Other atherosclerosis of native arteries of extremities, unspecified extremity: Principal | ICD-10-CM

## 2022-07-25 DIAGNOSIS — L97919 Non-pressure chronic ulcer of unspecified part of right lower leg with unspecified severity: Secondary | ICD-10-CM | POA: Diagnosis not present

## 2022-07-25 DIAGNOSIS — I70239 Atherosclerosis of native arteries of right leg with ulceration of unspecified site: Secondary | ICD-10-CM

## 2022-07-25 DIAGNOSIS — Z79899 Other long term (current) drug therapy: Secondary | ICD-10-CM

## 2022-07-25 DIAGNOSIS — Z9861 Coronary angioplasty status: Secondary | ICD-10-CM | POA: Diagnosis not present

## 2022-07-25 DIAGNOSIS — Z881 Allergy status to other antibiotic agents status: Secondary | ICD-10-CM

## 2022-07-25 DIAGNOSIS — I10 Essential (primary) hypertension: Secondary | ICD-10-CM | POA: Diagnosis present

## 2022-07-25 DIAGNOSIS — L97519 Non-pressure chronic ulcer of other part of right foot with unspecified severity: Secondary | ICD-10-CM | POA: Diagnosis present

## 2022-07-25 DIAGNOSIS — I70238 Atherosclerosis of native arteries of right leg with ulceration of other part of lower right leg: Secondary | ICD-10-CM | POA: Diagnosis not present

## 2022-07-25 DIAGNOSIS — I701 Atherosclerosis of renal artery: Secondary | ICD-10-CM

## 2022-07-25 DIAGNOSIS — I4891 Unspecified atrial fibrillation: Secondary | ICD-10-CM | POA: Diagnosis present

## 2022-07-25 DIAGNOSIS — T82856A Stenosis of peripheral vascular stent, initial encounter: Secondary | ICD-10-CM

## 2022-07-25 DIAGNOSIS — E11621 Type 2 diabetes mellitus with foot ulcer: Secondary | ICD-10-CM | POA: Diagnosis present

## 2022-07-25 DIAGNOSIS — I70235 Atherosclerosis of native arteries of right leg with ulceration of other part of foot: Secondary | ICD-10-CM | POA: Diagnosis present

## 2022-07-25 DIAGNOSIS — E1151 Type 2 diabetes mellitus with diabetic peripheral angiopathy without gangrene: Secondary | ICD-10-CM | POA: Diagnosis present

## 2022-07-25 DIAGNOSIS — Z95810 Presence of automatic (implantable) cardiac defibrillator: Secondary | ICD-10-CM

## 2022-07-25 DIAGNOSIS — Z9049 Acquired absence of other specified parts of digestive tract: Secondary | ICD-10-CM | POA: Diagnosis not present

## 2022-07-25 DIAGNOSIS — L97909 Non-pressure chronic ulcer of unspecified part of unspecified lower leg with unspecified severity: Principal | ICD-10-CM

## 2022-07-25 HISTORY — PX: LOWER EXTREMITY ANGIOGRAPHY: CATH118251

## 2022-07-25 LAB — FIBRINOGEN
Fibrinogen: 408 mg/dL (ref 210–475)
Fibrinogen: 292 mg/dL (ref 210–475)

## 2022-07-25 LAB — CBC
HCT: 36.8 % — ABNORMAL LOW (ref 39.0–52.0)
HCT: 36.1 % — ABNORMAL LOW (ref 39.0–52.0)
Hemoglobin: 12.2 g/dL — ABNORMAL LOW (ref 13.0–17.0)
Hemoglobin: 12 g/dL — ABNORMAL LOW (ref 13.0–17.0)
MCH: 28.6 pg (ref 26.0–34.0)
MCH: 28.6 pg (ref 26.0–34.0)
MCHC: 33.2 g/dL (ref 30.0–36.0)
MCHC: 33.2 g/dL (ref 30.0–36.0)
MCV: 86.2 fL (ref 80.0–100.0)
MCV: 86.2 fL (ref 80.0–100.0)
Platelets: 200 10*3/uL (ref 150–400)
Platelets: 159 10*3/uL (ref 150–400)
RBC: 4.27 MIL/uL (ref 4.22–5.81)
RBC: 4.19 MIL/uL — ABNORMAL LOW (ref 4.22–5.81)
RDW: 15.2 % (ref 11.5–15.5)
RDW: 15.1 % (ref 11.5–15.5)
WBC: 11.5 10*3/uL — ABNORMAL HIGH (ref 4.0–10.5)
WBC: 15.9 10*3/uL — ABNORMAL HIGH (ref 4.0–10.5)
nRBC: 0 % (ref 0.0–0.2)
nRBC: 0 % (ref 0.0–0.2)

## 2022-07-25 LAB — BUN: BUN: 16 mg/dL (ref 8–23)

## 2022-07-25 LAB — GLUCOSE, CAPILLARY
Glucose-Capillary: 128 mg/dL — ABNORMAL HIGH (ref 70–99)
Glucose-Capillary: 125 mg/dL — ABNORMAL HIGH (ref 70–99)
Glucose-Capillary: 160 mg/dL — ABNORMAL HIGH (ref 70–99)

## 2022-07-25 LAB — MRSA NEXT GEN BY PCR, NASAL: MRSA by PCR Next Gen: NOT DETECTED

## 2022-07-25 LAB — CREATININE, SERUM
Creatinine, Ser: 1.16 mg/dL (ref 0.61–1.24)
GFR, Estimated: >60 mL/min (ref >60)

## 2022-07-25 SURGERY — LOWER EXTREMITY ANGIOGRAPHY
Anesthesia: Moderate Sedation | Site: Leg Lower | Laterality: Right

## 2022-07-25 MED ORDER — SODIUM CHLORIDE 0.9% FLUSH: 3.0000 mL | INTRAVENOUS | Status: DC | PRN

## 2022-07-25 MED ORDER — SODIUM CHLORIDE 0.9% FLUSH
3.0000 mL | Freq: Two times a day (BID) | INTRAVENOUS | Status: DC
  Administered 2022-07-25 – 2022-07-26 (×2): 3 mL via INTRAVENOUS

## 2022-07-25 MED ORDER — SODIUM CHLORIDE 0.9 % IV SOLN: INTRAVENOUS | Status: DC

## 2022-07-25 MED ORDER — METOPROLOL SUCCINATE ER 50 MG PO TB24
150.0000 mg | ORAL_TABLET | Freq: Every day | ORAL | Status: DC
  Administered 2022-07-26 (×2): 150 mg via ORAL
  Filled 2022-07-25 (×2): qty 3

## 2022-07-25 MED ORDER — METHYLPREDNISOLONE SODIUM SUCC 125 MG IJ SOLR: 125.0000 mg | Freq: Once | INTRAMUSCULAR | Status: DC | PRN

## 2022-07-25 MED ORDER — METOPROLOL SUCCINATE ER 50 MG PO TB24: 100.0000 mg | ORAL_TABLET | Freq: Every day | ORAL | Status: DC

## 2022-07-25 MED ORDER — SODIUM CHLORIDE 0.9 % IV SOLN: 250.0000 mL | INTRAVENOUS | Status: DC | PRN

## 2022-07-25 MED ORDER — SPIRONOLACTONE 25 MG PO TABS
25.0000 mg | ORAL_TABLET | Freq: Every day | ORAL | Status: DC
  Administered 2022-07-25 – 2022-07-26 (×2): 25 mg via ORAL
  Filled 2022-07-25 (×2): qty 1

## 2022-07-25 MED ORDER — FENTANYL CITRATE (PF) 100 MCG/2ML IJ SOLN
INTRAMUSCULAR | Status: DC | PRN
  Administered 2022-07-25: 50 ug via INTRAVENOUS
  Administered 2022-07-25 (×2): 25 ug via INTRAVENOUS

## 2022-07-25 MED ORDER — HEPARIN (PORCINE) 25000 UT/250ML-% IV SOLN
600.0000 [IU]/h | INTRAVENOUS | Status: DC
  Administered 2022-07-25: 600 [IU]/h via INTRA_ARTERIAL

## 2022-07-25 MED ORDER — HEPARIN (PORCINE) 25000 UT/250ML-% IV SOLN
INTRAVENOUS | Status: AC
  Filled 2022-07-25: qty 250

## 2022-07-25 MED ORDER — ATORVASTATIN CALCIUM 20 MG PO TABS
40.0000 mg | ORAL_TABLET | Freq: Every day | ORAL | Status: DC
  Administered 2022-07-26 – 2022-07-27 (×2): 40 mg via ORAL
  Filled 2022-07-25 (×3): qty 2

## 2022-07-25 MED ORDER — SODIUM CHLORIDE 0.9 % IV SOLN
1.0000 mg/h | INTRAVENOUS | Status: AC
  Administered 2022-07-25: 1 mg/h
  Filled 2022-07-25: qty 10

## 2022-07-25 MED ORDER — ONDANSETRON HCL 4 MG/2ML IJ SOLN: 4.0000 mg | Freq: Four times a day (QID) | INTRAMUSCULAR | Status: DC | PRN

## 2022-07-25 MED ORDER — CEFAZOLIN SODIUM-DEXTROSE 1-4 GM/50ML-% IV SOLN
INTRAVENOUS | Status: DC | PRN
  Administered 2022-07-25: 2 g via INTRAVENOUS

## 2022-07-25 MED ORDER — DIPHENHYDRAMINE HCL 50 MG/ML IJ SOLN: 50.0000 mg | Freq: Once | INTRAMUSCULAR | Status: DC | PRN

## 2022-07-25 MED ORDER — HEPARIN SODIUM (PORCINE) 1000 UNIT/ML IJ SOLN
INTRAMUSCULAR | Status: DC | PRN
  Administered 2022-07-25: 5000 [IU] via INTRAVENOUS

## 2022-07-25 MED ORDER — MIDAZOLAM HCL 2 MG/ML PO SYRP: 8.0000 mg | ORAL_SOLUTION | Freq: Once | ORAL | Status: DC | PRN

## 2022-07-25 MED ORDER — MIDAZOLAM HCL 5 MG/5ML IJ SOLN
INTRAMUSCULAR | Status: AC
  Filled 2022-07-25: qty 5

## 2022-07-25 MED ORDER — HYDROMORPHONE HCL 1 MG/ML IJ SOLN
1.0000 mg | INTRAMUSCULAR | Status: DC | PRN
  Administered 2022-07-25 – 2022-07-26 (×3): 1 mg via INTRAVENOUS
  Filled 2022-07-25 (×3): qty 1

## 2022-07-25 MED ORDER — ORAL CARE MOUTH RINSE: 15.0000 mL | OROMUCOSAL | Status: DC | PRN

## 2022-07-25 MED ORDER — GLIPIZIDE 5 MG PO TABS
5.0000 mg | ORAL_TABLET | Freq: Two times a day (BID) | ORAL | Status: DC
  Administered 2022-07-26 (×2): 5 mg via ORAL
  Filled 2022-07-25 (×3): qty 1

## 2022-07-25 MED ORDER — HYDROMORPHONE HCL 1 MG/ML IJ SOLN
1.0000 mg | Freq: Once | INTRAMUSCULAR | Status: AC | PRN
  Administered 2022-07-25: 1 mg via INTRAVENOUS
  Filled 2022-07-25: qty 1

## 2022-07-25 MED ORDER — OXYCODONE-ACETAMINOPHEN 5-325 MG PO TABS
1.0000 | ORAL_TABLET | Freq: Four times a day (QID) | ORAL | Status: DC | PRN
  Administered 2022-07-25 – 2022-07-26 (×2): 2 via ORAL
  Filled 2022-07-25 (×2): qty 2

## 2022-07-25 MED ORDER — HEPARIN SODIUM (PORCINE) 1000 UNIT/ML IJ SOLN
INTRAMUSCULAR | Status: AC
  Filled 2022-07-25: qty 10

## 2022-07-25 MED ORDER — CEFAZOLIN SODIUM-DEXTROSE 2-4 GM/100ML-% IV SOLN
INTRAVENOUS | Status: AC
  Filled 2022-07-25: qty 100

## 2022-07-25 MED ORDER — HEPARIN (PORCINE) 25000 UT/250ML-% IV SOLN: 600.0000 [IU]/h | INTRAVENOUS | Status: DC

## 2022-07-25 MED ORDER — ALTEPLASE 1 MG/ML SYRINGE FOR VASCULAR PROCEDURE
INTRAMUSCULAR | Status: DC | PRN
  Administered 2022-07-25: 4 mg via INTRA_ARTERIAL

## 2022-07-25 MED ORDER — MIDAZOLAM HCL 2 MG/2ML IJ SOLN
INTRAMUSCULAR | Status: DC | PRN
  Administered 2022-07-25: 2 mg via INTRAVENOUS
  Administered 2022-07-25 (×2): 1 mg via INTRAVENOUS

## 2022-07-25 MED ORDER — IODIXANOL 320 MG/ML IV SOLN
INTRAVENOUS | Status: DC | PRN
  Administered 2022-07-25: 45 mL via INTRA_ARTERIAL

## 2022-07-25 MED ORDER — FENTANYL CITRATE (PF) 100 MCG/2ML IJ SOLN
INTRAMUSCULAR | Status: AC
  Filled 2022-07-25: qty 2

## 2022-07-25 MED ORDER — TACROLIMUS 0.5 MG PO CAPS
0.5000 mg | ORAL_CAPSULE | Freq: Two times a day (BID) | ORAL | Status: DC
  Administered 2022-07-25 – 2022-07-27 (×4): 0.5 mg via ORAL
  Filled 2022-07-25 (×4): qty 1

## 2022-07-25 MED ORDER — CHLORHEXIDINE GLUCONATE CLOTH 2 % EX PADS
6.0000 | MEDICATED_PAD | Freq: Every day | CUTANEOUS | Status: DC
  Administered 2022-07-25: 6 via TOPICAL

## 2022-07-25 MED ORDER — NITROGLYCERIN 0.4 MG SL SUBL: 0.4000 mg | SUBLINGUAL_TABLET | SUBLINGUAL | Status: DC | PRN

## 2022-07-25 MED ORDER — FAMOTIDINE 20 MG PO TABS: 40.0000 mg | ORAL_TABLET | Freq: Once | ORAL | Status: DC | PRN

## 2022-07-25 MED ORDER — MAGNESIUM OXIDE -MG SUPPLEMENT 400 (240 MG) MG PO TABS
400.0000 mg | ORAL_TABLET | Freq: Two times a day (BID) | ORAL | Status: DC
  Administered 2022-07-25 – 2022-07-27 (×4): 400 mg via ORAL
  Filled 2022-07-25 (×4): qty 1

## 2022-07-25 MED ORDER — SODIUM CHLORIDE 0.9 % IV SOLN
0.5000 mg/h | INTRAVENOUS | Status: DC
  Administered 2022-07-25: 0.5 mg/h
  Filled 2022-07-25 (×2): qty 10

## 2022-07-25 MED ORDER — OYSTER SHELL CALCIUM/D3 500-5 MG-MCG PO TABS
1.0000 | ORAL_TABLET | Freq: Three times a day (TID) | ORAL | Status: DC
  Administered 2022-07-25 – 2022-07-27 (×4): 1 via ORAL
  Filled 2022-07-25 (×5): qty 1

## 2022-07-25 MED ORDER — CEFAZOLIN SODIUM-DEXTROSE 2-4 GM/100ML-% IV SOLN: 2.0000 g | INTRAVENOUS | Status: DC

## 2022-07-25 MED ORDER — ALTEPLASE 2 MG IJ SOLR
INTRAMUSCULAR | Status: AC
  Filled 2022-07-25: qty 4

## 2022-07-25 MED ORDER — LOSARTAN POTASSIUM 50 MG PO TABS
25.0000 mg | ORAL_TABLET | Freq: Every day | ORAL | Status: DC
  Administered 2022-07-25 – 2022-07-26 (×2): 25 mg via ORAL
  Filled 2022-07-25 (×2): qty 1

## 2022-07-25 SURGICAL SUPPLY — 20 items
BALLN LUTONIX 018 4X150X130 (BALLOONS) ×1
BALLN LUTONIX 018 5X300X130 (BALLOONS) ×1
BALLOON LUTONIX 018 4X150X130 (BALLOONS) IMPLANT
BALLOON LUTONIX 018 5X300X130 (BALLOONS) IMPLANT
CATH ANGIO 5F PIGTAIL 65CM (CATHETERS) IMPLANT
CATH BEACON 5 .038 100 VERT TP (CATHETERS) IMPLANT
CATH INFUS 135CMX50CM (CATHETERS) IMPLANT
CATH ROTAREX 135 6FR (CATHETERS) IMPLANT
GLIDEWIRE ADV .035X260CM (WIRE) IMPLANT
KIT CV MULTILUMEN 7FR 20 (SET/KITS/TRAYS/PACK) ×1
KIT CV MULTILUMEN 7FR 20 SUB (SET/KITS/TRAYS/PACK) IMPLANT
KIT ENCORE 26 ADVANTAGE (KITS) IMPLANT
PACK ANGIOGRAPHY (CUSTOM PROCEDURE TRAY) ×1 IMPLANT
SET INTRO CAPELLA COAXIAL (SET/KITS/TRAYS/PACK) IMPLANT
SHEATH ANL2 6FRX45 HC (SHEATH) IMPLANT
SHEATH BRITE TIP 5FRX11 (SHEATH) IMPLANT
SYR MEDRAD MARK 7 150ML (SYRINGE) IMPLANT
TUBING CONTRAST HIGH PRESS 72 (TUBING) IMPLANT
WIRE G V18X300CM (WIRE) IMPLANT
WIRE GUIDERIGHT .035X150 (WIRE) IMPLANT

## 2022-07-25 NOTE — Op Note (Addendum)
[facility]  Percutaneous Study/Intervention Procedural Note   Date of Surgery: 07/25/2022  Surgeon(s):Florita Nitsch    Assistants:none  Pre-operative Diagnosis: PAD with ulceration RLE  Post-operative diagnosis:  Same  Procedure(s) Performed:             1.  Ultrasound guidance for vascular access left femoral artery             2.  Catheter placement into right common femoral artery from left femoral approach             3.  Aortogram and selective right lower extremity angiogram             4.  Mechanical thrombectomy of the right SFA and popliteal arteries with the [place] Rex device             5.  Percutaneous transluminal angioplasty of the right tibioperoneal trunk and distal popliteal artery with a 4 mm diameter by 10 cm length Lutonix drug-coated angioplasty balloon  6.  Percutaneous transluminal angioplasty of the entire right SFA and proximal and mid to distal popliteal artery with 2 inflations with a 5 mm diameter by 30 cm length Lutonix drug-coated angioplasty balloon             7.  Catheter directed thrombolytic therapy with 4 mg of tPA in the right SFA, popliteal artery, and tibioperoneal trunk and placement of a 135 cm total length 50 cm working length catheter for continuous thrombolytic therapy  8.  Ultrasound guidance for vascular access left femoral vein  9.  Placement of a left femoral venous triple-lumen catheter  EBL: 100 cc  Contrast: 45 cc  Fluoro Time: 7.4 minutes  Moderate Conscious Sedation Time: approximately 50 minutes using 4 mg of Versed and 100 mcg of Fentanyl              Indications:  Patient is a 77 y.o.male with a nonhealing ulceration and poor perfusion with a history of significant peripheral arterial disease. The patient has noninvasive study showing dues perfusion on the right. The patient is brought in for angiography for further evaluation and potential treatment.  Due to the limb threatening nature of the situation,  angiogram was performed for attempted limb salvage. The patient is aware that if the procedure fails, amputation would be expected.  The patient also understands that even with successful revascularization, amputation may still be required due to the severity of the situation.  Risks and benefits are discussed and informed consent is obtained.   Procedure:  The patient was identified and appropriate procedural time out was performed.  The patient was then placed supine on the table and prepped and draped in the usual sterile fashion. Moderate conscious sedation was administered during a face to face encounter with the patient throughout the procedure with my supervision of the RN administering medicines and monitoring the patient's vital signs, pulse oximetry, telemetry and mental status throughout from the start of the procedure until the patient was taken to the recovery room. Ultrasound was used to evaluate the left common femoral artery.  It was patent .  A digital ultrasound image was acquired.  A Seldinger needle was used to access the left common femoral artery under direct ultrasound guidance and a permanent image was performed.  A 0.035 J wire was advanced without resistance and a 5Fr sheath was placed.  Pigtail catheter was placed into the aorta and an AP aortogram was performed. 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 I then crossed the aortic bifurcation and advanced to the right femoral head. Selective right lower extremity angiogram was then performed. This demonstrated occlusion of the SFA just above the previously placed stent with occlusion of the remainder of the SFA and the entire popliteal artery that were previously stented.  There was occlusion of the proximal portion of all 3 tibial vessels with reconstitution of all 3 tibial vessels after the proximal occlusions with two-vessel runoff distally. It was  felt that it was in the patient's best interest to proceed with intervention after these images to avoid a second procedure and a larger amount of contrast and fluoroscopy based off of the findings from the initial angiogram. The patient was systemically heparinized and a 6 [place] Ansell sheath was then placed over the [employer] wire. I then used a Kumpe catheter and the advantage wire to get into the occluded SFA and cross the lesion without difficulty getting down into the peroneal artery and then exchanging for a V18 wire.  Mechanical thrombectomy was then performed to debulk the thrombus with the [place] Rex device.  2 passes were made in the right SFA and popliteal arteries but there remain continued occlusion and poor flow distally.  I then performed angioplasty with a 4 mm diameter by 10 cm length Lutonix drug-coated angioplasty balloon in the tibioperoneal trunk and distal popliteal artery and inflated this to 10 atm for 1 minute.  I used a 5 mm diameter by 30 cm length Lutonix drug-coated angioplasty balloon to treat the entirety of the SFA with the first inflation and then the popliteal artery down to the mid to distal segment with a second inflation.  These inflations were 8 to 10 atm for 1 minute.  Completion imaging showed continued occlusion with no flow distally and I felt our only hope for salvage of the situation would be continuous thrombolytic therapy.  This will require inpatient admission and likely 48 hours in the hospital.  A catheter will be placed and he will come back tomorrow for second look angiogram and attempted revascularization.  I then instilled a 135 cm total length 50 cm working length catheter with its distal tip in the tibioperoneal trunk and the proximal portion of the proximal SFA.  4 mg of tPA were instilled and this was prepared for continuous thrombolytic therapy.  The thrombolytic catheter and the sheath were secured with silk suture.  A triple-lumen catheter was then  placed.  The left femoral vein was visualized with ultrasound and found to be widely patent.  It was accessed under direct ultrasound guidance without difficulty with a Seldinger needle and a permanent image was recorded.  A J-wire was placed.  After skin nick and dilatation a triple-lumen catheter was placed over the wire and the wire was removed.  All 3 lm withdrew dark red nonpulsatile blood and flushed easily with sterile saline. I elected to terminate the procedure.  The patient was taken to the recovery room in stable condition having tolerated the procedure well.  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  There was occlusion of the proximal portion of all 3 tibial vessels with reconstitution of all 3 tibial vessels after the proximal occlusions with two-vessel runoff distally   Disposition: Patient was taken to the recovery room in stable condition having tolerated the procedure well.  Patient will require ICU stay tonight and likely 48-hour hospital admission for revascularization.  Complications: None  [name] 07/25/2022 3:56 PM   This note was created with Dragon Medical transcription system. Any errors in dictation are purely unintentional.

## 2022-07-25 NOTE — Interval H&P Note (Signed)
History and Physical Interval Note:  07/25/2022 1:52 PM  Dean King  has presented today for surgery, with the diagnosis of RLE Angio   BARD   ASO w ulceration.  The various methods of treatment have been discussed with the patient and family. After consideration of risks, benefits and other options for treatment, the patient has consented to  Procedure(s): Lower Extremity Angiography (Right) as a surgical intervention.  The patient's history has been reviewed, patient examined, no change in status, stable for surgery.  I have reviewed the patient's chart and labs.  Questions were answered to the patient's satisfaction.     [name]

## 2022-07-26 ENCOUNTER — Encounter
Admission: RE | Disposition: A | Discharge status: Home / Self Care | Payer: Self-pay | Source: Home / Self Care | Attending: Vascular Surgery

## 2022-07-26 ENCOUNTER — Encounter: Payer: Self-pay | Admitting: Vascular Surgery

## 2022-07-26 DIAGNOSIS — I998 Other disorder of circulatory system: Secondary | ICD-10-CM | POA: Diagnosis present

## 2022-07-26 DIAGNOSIS — I743 Embolism and thrombosis of arteries of the lower extremities: Secondary | ICD-10-CM

## 2022-07-26 DIAGNOSIS — I70238 Atherosclerosis of native arteries of right leg with ulceration of other part of lower right leg: Secondary | ICD-10-CM

## 2022-07-26 HISTORY — PX: LOWER EXTREMITY INTERVENTION: CATH118252

## 2022-07-26 LAB — BASIC METABOLIC PANEL
Anion gap: 8 (ref 5–15)
BUN: 15 mg/dL (ref 8–23)
CO2: 27 mmol/L (ref 22–32)
Calcium: 9.1 mg/dL (ref 8.9–10.3)
Chloride: 102 mmol/L (ref 98–111)
Creatinine, Ser: 1.11 mg/dL (ref 0.61–1.24)
GFR, Estimated: >60 mL/min (ref >60)
Glucose, Bld: 131 mg/dL — ABNORMAL HIGH (ref 70–99)
Potassium: 4.4 mmol/L (ref 3.5–5.1)
Sodium: 137 mmol/L (ref 135–145)

## 2022-07-26 LAB — CBC
HCT: 36.7 % — ABNORMAL LOW (ref 39.0–52.0)
Hemoglobin: 12.3 g/dL — ABNORMAL LOW (ref 13.0–17.0)
MCH: 28.5 pg (ref 26.0–34.0)
MCHC: 33.5 g/dL (ref 30.0–36.0)
MCV: 85.2 fL (ref 80.0–100.0)
Platelets: 158 10*3/uL (ref 150–400)
RBC: 4.31 MIL/uL (ref 4.22–5.81)
RDW: 14.8 % (ref 11.5–15.5)
WBC: 11.7 10*3/uL — ABNORMAL HIGH (ref 4.0–10.5)
nRBC: 0 % (ref 0.0–0.2)

## 2022-07-26 LAB — FIBRINOGEN: Fibrinogen: 273 mg/dL (ref 210–475)

## 2022-07-26 LAB — GLUCOSE, CAPILLARY: Glucose-Capillary: 114 mg/dL — ABNORMAL HIGH (ref 70–99)

## 2022-07-26 SURGERY — LOWER EXTREMITY INTERVENTION
Anesthesia: Moderate Sedation | Laterality: Right

## 2022-07-26 MED ORDER — TIROFIBAN HCL IN NACL 5-0.9 MG/100ML-% IV SOLN
0.0750 ug/kg/min | INTRAVENOUS | Status: DC
  Filled 2022-07-26 (×2): qty 100

## 2022-07-26 MED ORDER — CLOPIDOGREL BISULFATE 75 MG PO TABS
75.0000 mg | ORAL_TABLET | Freq: Every day | ORAL | Status: DC
  Administered 2022-07-26 – 2022-07-27 (×2): 75 mg via ORAL
  Filled 2022-07-26 (×2): qty 1

## 2022-07-26 MED ORDER — TIROFIBAN (AGGRASTAT) BOLUS VIA INFUSION
25.0000 ug/kg | Freq: Once | INTRAVENOUS | Status: AC
  Administered 2022-07-26: 1905 ug via INTRAVENOUS
  Filled 2022-07-26: qty 39

## 2022-07-26 MED ORDER — CEFAZOLIN SODIUM-DEXTROSE 2-4 GM/100ML-% IV SOLN
2.0000 g | Freq: Once | INTRAVENOUS | Status: AC
  Administered 2022-07-26: 2 g via INTRAVENOUS

## 2022-07-26 MED ORDER — OXYCODONE-ACETAMINOPHEN 5-325 MG PO TABS: 1.0000 | ORAL_TABLET | Freq: Four times a day (QID) | ORAL | 0 refills | Status: DC | PRN

## 2022-07-26 MED ORDER — HEPARIN SODIUM (PORCINE) 1000 UNIT/ML IJ SOLN
INTRAMUSCULAR | Status: DC | PRN
  Administered 2022-07-26: 3000 [IU] via INTRAVENOUS

## 2022-07-26 MED ORDER — CEFAZOLIN SODIUM-DEXTROSE 2-4 GM/100ML-% IV SOLN
INTRAVENOUS | Status: AC
  Filled 2022-07-26: qty 100

## 2022-07-26 MED ORDER — FENTANYL CITRATE (PF) 100 MCG/2ML IJ SOLN
INTRAMUSCULAR | Status: DC | PRN
  Administered 2022-07-26: 50 ug via INTRAVENOUS
  Administered 2022-07-26: 25 ug via INTRAVENOUS

## 2022-07-26 MED ORDER — TIROFIBAN HCL IV 12.5 MG/250 ML
INTRAVENOUS | Status: AC
  Administered 2022-07-26: 0.075 ug/kg/min via INTRAVENOUS
  Filled 2022-07-26: qty 250

## 2022-07-26 MED ORDER — MIDAZOLAM HCL 5 MG/5ML IJ SOLN
INTRAMUSCULAR | Status: AC
  Filled 2022-07-26: qty 5

## 2022-07-26 MED ORDER — IODIXANOL 320 MG/ML IV SOLN
INTRAVENOUS | Status: DC | PRN
  Administered 2022-07-26: 50 mL

## 2022-07-26 MED ORDER — ASPIRIN 81 MG PO TBEC
81.0000 mg | DELAYED_RELEASE_TABLET | Freq: Every day | ORAL | Status: DC
  Administered 2022-07-26 – 2022-07-27 (×2): 81 mg via ORAL
  Filled 2022-07-26 (×2): qty 1

## 2022-07-26 MED ORDER — MIDAZOLAM HCL 2 MG/2ML IJ SOLN
INTRAMUSCULAR | Status: DC | PRN
  Administered 2022-07-26: 2 mg via INTRAVENOUS
  Administered 2022-07-26: 1 mg via INTRAVENOUS

## 2022-07-26 MED ORDER — HEPARIN SODIUM (PORCINE) 1000 UNIT/ML IJ SOLN
INTRAMUSCULAR | Status: AC
  Filled 2022-07-26: qty 10

## 2022-07-26 MED ORDER — APIXABAN 5 MG PO TABS
5.0000 mg | ORAL_TABLET | Freq: Two times a day (BID) | ORAL | Status: DC
  Administered 2022-07-27: 5 mg via ORAL
  Filled 2022-07-26: qty 1

## 2022-07-26 MED ORDER — FENTANYL CITRATE (PF) 100 MCG/2ML IJ SOLN
INTRAMUSCULAR | Status: AC
  Filled 2022-07-26: qty 2

## 2022-07-26 SURGICAL SUPPLY — 21 items
BALLN LUTONIX 018 4X80X130 (BALLOONS) ×1
BALLN LUTONIX 018 5X80X130 (BALLOONS) ×1
BALLN LUTONIX 7X80X130 (BALLOONS) ×1
BALLN ULTRVRSE 3X80X150 (BALLOONS) ×1
BALLN ULTRVRSE 3X80X150 OTW (BALLOONS) ×1
BALLOON LUTONIX 018 4X80X130 (BALLOONS) IMPLANT
BALLOON LUTONIX 018 5X80X130 (BALLOONS) IMPLANT
BALLOON LUTONIX 7X80X130 (BALLOONS) IMPLANT
BALLOON ULTRVRSE 3X80X150 OTW (BALLOONS) IMPLANT
CANISTER PENUMBRA ENGINE (MISCELLANEOUS) IMPLANT
CATH BEACON 5 .038 100 VERT TP (CATHETERS) IMPLANT
CATH LIGHTNING BOLT 7 130 (CATHETERS) IMPLANT
DEVICE STARCLOSE SE CLOSURE (Vascular Products) IMPLANT
GLIDEWIRE ADV .035X260CM (WIRE) IMPLANT
KIT ENCORE 26 ADVANTAGE (KITS) IMPLANT
PACK ANGIOGRAPHY (CUSTOM PROCEDURE TRAY) IMPLANT
SHEATH FLEXOR ANSEL2 7FRX45 (SHEATH) IMPLANT
STENT LIFESTENT 5F 6X80X135 (Permanent Stent) IMPLANT
STENT VIABAHN 8X7.5X120 (Permanent Stent) IMPLANT
TUBING CONNECTING 10 (TUBING) IMPLANT
WIRE G V18X300CM (WIRE) IMPLANT

## 2022-07-26 NOTE — Plan of Care (Signed)

## 2022-07-26 NOTE — Discharge Summary (Signed)
[facility]    Discharge Summary    Patient ID:  Dean King MRN: [id number] DOB/AGE: [birth year] 77 y.o.  Admit date: 07/25/2022 Discharge date: 07/26/2022 Date of Surgery: 07/26/2022 Surgeon: Surgeon(s): Wyn Quaker Marlow Baars, MD  Admission Diagnosis: Ischemic leg [I99.8]  Discharge Diagnoses:  Ischemic leg [I99.8]  Secondary Diagnoses: Past Medical History:  Diagnosis Date   AICD (automatic cardioverter/defibrillator) present    Diabetes mellitus without complication (HCC)    Hypertension     Procedure(s): LOWER EXTREMITY INTERVENTION  Discharged Condition: good  HPI: Dean King is a 77 yo male now status post right lower extremity angiogram with mechanical thrombectomy of the right SFA, popliteal artery, tibioperoneal trunk, and proximal peroneal artery.  He also had a covered stent placed to the right SFA with angioplasty to the right tibioperoneal trunk and distal popliteal artery.  Patient is recovering as expected.  Patient has no complaints at this time.  States that his right lower extremity does feel better.  On palpation extremity feels warm to touch.  No complications post procedure.  No fever or chills.  No chest pain or shortness of breath.  Patient ready for discharge.  Hospital Course:  Dean King is a 77 y.o. male is S/P right lower extremity angiogram with mechanical thrombectomy of the right SFA, popliteal artery, tibioperoneal trunk, and proximal peroneal artery.  He also had a covered stent placed to the right SFA with angioplasty to the right tibioperoneal trunk and distal popliteal artery.   Extubated: POD # 0 Physical Exam:  Alert notes x3, no acute distress Face: Symmetrical.  Tongue is midline. Neck: Trachea is midline.  No swelling or bruising. Cardiovascular: Regular rate and rhythm Pulmonary: Clear to auscultation bilaterally Abdomen: Soft, nontender, nondistended Right groin access: Clean dry and intact.  No swelling  or drainage noted Left groin access: Clean dry and intact.  No swelling or drainage noted Left lower extremity: Thigh soft.  Calf soft.  Extremities warm distally toes.  Hard to palpate pedal pulses however the foot is warm is her good capillary refill. Right lower extremity: Thigh soft.  Calf soft.  Extremities warm distally toes.  Hard to palpate pedal pulses however the foot is warm is her good capillary refill. Neurological: No deficits noted   Post-op wounds:  clean, dry, intact or healing well  Pt. Ambulating, voiding and taking PO diet without difficulty. Pt pain controlled with PO pain meds.  Labs:  As below  Complications: none  Consults:    Significant Diagnostic Studies: CBC Lab Results  Component Value Date   WBC 11.7 (H) 07/26/2022   HGB 12.3 (L) 07/26/2022   HCT 36.7 (L) 07/26/2022   MCV 85.2 07/26/2022   PLT 158 07/26/2022    BMET    Component Value Date/Time   NA 137 07/26/2022 0403   K 4.4 07/26/2022 0403   CL 102 07/26/2022 0403   CO2 27 07/26/2022 0403   GLUCOSE 131 (H) 07/26/2022 0403   BUN 15 07/26/2022 0403   CREATININE 1.11 07/26/2022 0403   CALCIUM 9.1 07/26/2022 0403   GFRNONAA >60 07/26/2022 0403   COAG Lab Results  Component Value Date   INR 1.0 03/23/2021     Disposition:  Discharge to :Home  Allergies as of 07/26/2022       Reactions   Levaquin [levofloxacin]         Medication List     TAKE these medications    atorvastatin 40 MG tablet  Commonly known as: LIPITOR Take 1 tablet (40 mg total) by mouth daily.   Calcium 600+D3 Plus Minerals 600-800 MG-UNIT Tabs Take 1 tablet by mouth 3 (three) times daily.   cephALEXin 500 MG capsule Commonly known as: KEFLEX Take 1 capsule (500 mg total) by mouth 3 (three) times daily for 10 days.   clopidogrel 75 MG tablet Commonly known as: PLAVIX Take 1 tablet (75 mg total) by mouth daily.   dapagliflozin propanediol 10 MG Tabs tablet Commonly known as: FARXIGA Take by  mouth daily.   Eliquis 5 MG Tabs tablet Generic drug: apixaban Take 1 tablet (5 mg total) by mouth 2 (two) times daily.   glipiZIDE 5 MG tablet Commonly known as: GLUCOTROL Take 5 mg by mouth 2 (two) times daily before a meal.   losartan 25 MG tablet Commonly known as: Cozaar Take 1 tablet (25 mg total) by mouth daily.   magnesium oxide 400 MG tablet Commonly known as: MAG-OX Take 400 mg by mouth 2 (two) times daily.   metFORMIN 500 MG tablet Commonly known as: GLUCOPHAGE Take 1,000 mg by mouth 2 (two) times daily.   metoprolol succinate 100 MG 24 hr tablet Commonly known as: TOPROL-XL Take 150 mg by mouth daily.   nitroGLYCERIN 0.4 MG SL tablet Commonly known as: NITROSTAT Place 0.4 mg under the tongue as needed.   oxyCODONE-acetaminophen 5-325 MG tablet Commonly known as: PERCOCET/ROXICET Take 1-2 tablets by mouth every 6 (six) hours as needed for moderate pain.   Prograf 0.5 MG capsule Generic drug: tacrolimus Take 0.5 mg by mouth 2 (two) times daily.   spironolactone 25 MG tablet Commonly known as: ALDACTONE Take 25 mg by mouth daily.       Verbal and written Discharge instructions given to the patient. Wound care per Discharge AVS  Follow-up Information     Dew, Marlow Baars, MD Follow up in 1 month(s).   Specialties: Vascular Surgery, Radiology, Interventional Cardiology Why: ABI's Contact information: [address] [place] [address] [phone]                 Signed: Marcie Bal, NP  07/26/2022, 4:03 PM

## 2022-07-26 NOTE — Op Note (Signed)
[place] [facility]  Percutaneous Study/Intervention Procedural Note   Date of Surgery: 07/26/2022  Surgeon(s):Georgie Haque    Assistants:none  Pre-operative Diagnosis: PAD with ulceration right lower extremity  Post-operative diagnosis:  Same  Procedure(s) Performed:             1.  Selective right lower extremity angiogram             2.  Mechanical thrombectomy using the penumbra 7 bolt catheter to the right SFA, popliteal artery, tibioperoneal trunk, and proximal peroneal artery             3.  Covered stent placement to the proximal right SFA with 8 mm diameter by 7.5 cm length Viabahn stent             4.  Percutaneous transluminal angioplasty of right tibioperoneal trunk and distal popliteal artery with 4 mm diameter by 8 cm length Lutonix drug-coated angioplasty balloon             5.  Life stent placement to the right tibioperoneal trunk and most distal popliteal artery with 6 mm diameter by 8 cm length stent  6.  Percutaneous transluminal angioplasty of the right proximal peroneal artery with 3 mm diameter by 8 cm length angioplasty balloon             7.  StarClose closure device left femoral artery  EBL: 200 cc   Contrast: 50 cc  Fluoro Time: 8.7 minutes  Moderate Conscious Sedation Time: approximately 50 minutes using 3 mg of Versed and 75 mcg of Fentanyl              Indications:  Patient is a 76 y.o.male with limb threatening right lower extremity ischemia with ulceration.  He has been getting overnight thrombolytic therapy and is brought back for second look angiography. The patient is brought in for angiography for further evaluation and potential treatment.  Due to the limb threatening nature of the situation, angiogram was performed for attempted limb salvage. The patient is aware that if the procedure fails, amputation would be expected.  The patient also understands that even with successful revascularization, amputation may still be required due to the  severity of the situation.  Risks and benefits are discussed and informed consent is obtained.   Procedure:  The patient was identified and appropriate procedural time out was performed.  The patient was then placed supine on the table and prepped and draped in the usual sterile fashion. Moderate conscious sedation was administered during a face to face encounter with the patient throughout the procedure with my supervision of the RN administering medicines and monitoring the patient's vital signs, pulse oximetry, telemetry and mental status throughout from the start of the procedure until the patient was taken to the recovery room. The existing lysis catheter was removed and a wire placed. Selective right lower extremity angiogram was then performed. This demonstrated the majority of the right lower extremity thrombus had resolved with tPA.  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  All 3 tibial vessels then had some flow distally after the proximal disease.  It was felt that it was in the patient's best interest to proceed with intervention after these images to avoid a second procedure and a larger amount of contrast and fluoroscopy based off  of the findings from the initial angiogram. The patient was systemically heparinized and I upsized to a 7 [place] Ansell sheath was then placed over the [employer] wire. I then used a Kumpe catheter and the advantage wire to get down into the peroneal artery and exchanged for a V18 wire.  Mechanical thrombectomy was then performed using the penumbra 7 bolt catheter first in the right SFA with only a mild amount of improvement and then in the right tibioperoneal trunk and distal popliteal artery as well as the proximal peroneal artery.  There was improvement in both  locations, but flow-limiting stenosis and thrombus remained in both locations despite multiple passes.  Proximally, I elected to place an 8 mm diameter by 7.5 cm length Viabahn stent to exclude the thrombus and stenosis in the proximal SFA.  This was brought to about 1 cm below the origin of the profunda femoris artery with about 1 to 2 cm of overlap between the previously placed stents.  This was postdilated with a 7 mm diameter Lutonix drug-coated balloon with excellent angiographic completion result and less than 10% residual stenosis.  I then turned my attention to the tibioperoneal trunk.  I started with a 4 mm diameter by 8 cm length Lutonix drug-coated angioplasty balloon inflated to 10 atm for 1 minute.  Another pass with the 7 bolt penumbra catheter was done in the tibioperoneal trunk.  There was improvement but flow-limiting residual stenosis and thrombus remained in the tibioperoneal trunk.  I did not want to cover the origin of the anterior tibial artery with a covered stent and there were large collaterals in the area so I elected to use a 6 mm diameter by 8 cm length life stent to preserve the collaterals.  This was postdilated with a 5 mm diameter Lutonix drug-coated angioplasty balloon.  This resulted in less than 20% residual stenosis.  There is some narrowing at the origin of the peroneal artery that was treated separately with a 3 mm diameter by 8 cm length angioplasty balloon inflated to 8 atm for 1 minute.  Completion imaging of the peroneal artery showed less than 20% residual stenosis after angioplasty. I elected to terminate the procedure. The sheath was removed and StarClose closure device was deployed in the left femoral artery with excellent hemostatic result. The patient was taken to the recovery room in stable condition having tolerated the procedure well.  Findings:                           Right Lower Extremity:  This demonstrated the majority of the right lower extremity thrombus  had resolved with tPA.  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  All 3 tibial vessels then had some flow distally after the proximal disease.   Disposition: Patient was taken to the recovery room in stable condition having tolerated the procedure well.  Complications: None  [name] 07/26/2022 9:14 AM   This note was created with Dragon Medical transcription system. Any errors in dictation are purely unintentional.

## 2022-07-26 NOTE — Interval H&P Note (Signed)
History and Physical Interval Note:  07/26/2022 8:01 AM  Dean King  has presented today for surgery, with the diagnosis of Pt. 2 right leg lysis.  The various methods of treatment have been discussed with the patient and family. After consideration of risks, benefits and other options for treatment, the patient has consented to  Procedure(s): LOWER EXTREMITY INTERVENTION (Right) as a surgical intervention.  The patient's history has been reviewed, patient examined, no change in status, stable for surgery.  I have reviewed the patient's chart and labs.  Questions were answered to the patient's satisfaction.     [name]

## 2022-07-27 NOTE — Progress Notes (Addendum)
0800 Patient ready to go home he says. Denies pain. Left groin site bandaged with pressure dressing. 1030 Left groin TLC removed as well as cath.site visualized. Tegaderm placed over both sites.[id number] Discharged home with wife after discharge teaching done.

## 2022-07-27 NOTE — Progress Notes (Signed)
[facility]  Daily Progress Note   Subjective  -   Mr. Dean King had right SFA/Popliteal stenting for a right 5th MT ulcer with malperfusion.  He had thrombosis of the stents and underwent lysis several days later.  He was on Eliquis for A fib. and is now on Eliquis and Plavix.  He is doing well.  He has 1/10 pain in the right posterior and lateral calf.  He is markedly improved from before lysis.  He has ambulated, eaten, voided and is ready for discharge.  He has f/u arranged with Dr. Wyn Quaker.  Objective Vitals:   07/27/22 0500 07/27/22 0600 07/27/22 0700 07/27/22 0800  BP: 123/63 (!) 118/58 (!) 108/47 (!) 121/56  Pulse: 78 77 73 75  Resp: 18 17 17 17   Temp:    98.7 F (37.1 C)  TempSrc:    Oral  SpO2: 95% 97% 93% 94%  Weight:      Height:        Intake/Output Summary (Last 24 hours) at 07/27/2022 1055 Last data filed at 07/27/2022 0930 Gross per 24 hour  Intake 1539.7 ml  Output 1620 ml  Net -80.3 ml    PULM  CTAB CV  RRR VASC  Palpable bilateral femoral pulses with mild left groin access site tenderness and mild ecchymosis but no hematoma.  Non palpable popliteal pulses bilaterally.  Right calf and foot have mild swelling (reperfusion) and no palpable DP or PT pulses bilaterally but has good monophasic PT and DP Doppler flow with increased diastolic flow on the right c/w reperfusion low resistance.  Laboratory CBC    Component Value Date/Time   WBC 11.7 (H) 07/26/2022 0403   HGB 12.3 (L) 07/26/2022 0403   HCT 36.7 (L) 07/26/2022 0403   PLT 158 07/26/2022 0403    BMET    Component Value Date/Time   NA 137 07/26/2022 0403   K 4.4 07/26/2022 0403   CL 102 07/26/2022 0403   CO2 27 07/26/2022 0403   GLUCOSE 131 (H) 07/26/2022 0403   BUN 15 07/26/2022 0403   CREATININE 1.11 07/26/2022 0403   CALCIUM 9.1 07/26/2022 0403   GFRNONAA >60 07/26/2022 0403    Assessment/Planning: POD #2 s/p right SFA/Popliteal stent lysis  Plan for discharge today with  Eliquis and Plavix to continue as outpatient.  Office f/u with Dr. Wyn Quaker as scheduled.  No smoking.  Exercise and diet per healthy guidelines   Iline Oven  07/27/2022, 10:55 AM

## 2022-07-29 ENCOUNTER — Encounter: Payer: Self-pay | Admitting: Vascular Surgery

## 2022-08-05 ENCOUNTER — Encounter: Payer: Self-pay | Admitting: Podiatry

## 2022-08-05 ENCOUNTER — Ambulatory Visit (INDEPENDENT_AMBULATORY_CARE_PROVIDER_SITE_OTHER): Payer: Medicare Other | Admitting: Podiatry

## 2022-08-05 DIAGNOSIS — L97512 Non-pressure chronic ulcer of other part of right foot with fat layer exposed: Secondary | ICD-10-CM | POA: Diagnosis not present

## 2022-08-05 DIAGNOSIS — I739 Peripheral vascular disease, unspecified: Secondary | ICD-10-CM

## 2022-08-05 NOTE — Progress Notes (Signed)
  Subjective:  Patient ID: Dean King, male    DOB: 07-Mar-1946,  MRN: [id number]  Chief Complaint  Patient presents with   Diabetic Ulcer    4 week follow up right foot lateral fifth met - looks very macerated - patient has been using neosporin and a bandaid every day    77 y.o. male presents with the above complaint. History confirmed with patient.  He returns for follow-up had a second angiogram, they had complete occlusion of the previously placed stents, has two-stage with catheter thrombolysis completed.  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  No cellulitis.      Assessment:   1. 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 PAD (peripheral artery disease) (HCC)      Plan:  Patient was evaluated and treated and all questions answered.   Ulcer right foot -We discussed the etiology and factors that are a part of the wound healing process.  We also discussed the risk of infection both soft tissue and osteomyelitis from open ulceration.  Discussed the risk of limb loss if this happens or worsens. -Debridement as below, utilized lidocaine ointment for topical anesthetic. -Dressed with Iodosorb, DSD. -Continue home dressing changes daily with mupirocin ointment -Has once again had revascularization hopefully can begin to improve.  Procedure: Excisional Debridement of Wound Rationale: Removal of non-viable soft tissue from the wound to promote healing.  Anesthesia: none Post-Debridement Wound Measurements: Noted above Type of Debridement:  Sharp Excisional Tissue Removed: Non-viable soft tissue Depth of Debridement: subcutaneous tissue. Technique: Sharp excisional debridement to bleeding, viable wound base.  Dressing: Dry, sterile, compression dressing. Disposition: Patient tolerated procedure well.    Return in about 3 weeks (around 08/26/2022) for wound care.

## 2022-08-08 ENCOUNTER — Other Ambulatory Visit (INDEPENDENT_AMBULATORY_CARE_PROVIDER_SITE_OTHER): Payer: Self-pay | Admitting: Vascular Surgery

## 2022-08-08 DIAGNOSIS — I739 Peripheral vascular disease, unspecified: Secondary | ICD-10-CM

## 2022-08-13 ENCOUNTER — Encounter (INDEPENDENT_AMBULATORY_CARE_PROVIDER_SITE_OTHER): Payer: Self-pay | Admitting: Vascular Surgery

## 2022-08-13 ENCOUNTER — Ambulatory Visit (INDEPENDENT_AMBULATORY_CARE_PROVIDER_SITE_OTHER): Payer: Medicare Other

## 2022-08-13 ENCOUNTER — Ambulatory Visit (INDEPENDENT_AMBULATORY_CARE_PROVIDER_SITE_OTHER): Payer: Medicare Other | Admitting: Vascular Surgery

## 2022-08-13 VITALS — BP 121/72 | HR 75 | Resp 16 | Wt 172.8 lb

## 2022-08-13 DIAGNOSIS — I70219 Atherosclerosis of native arteries of extremities with intermittent claudication, unspecified extremity: Secondary | ICD-10-CM | POA: Insufficient documentation

## 2022-08-13 DIAGNOSIS — E119 Type 2 diabetes mellitus without complications: Secondary | ICD-10-CM

## 2022-08-13 DIAGNOSIS — I63239 Cerebral infarction due to unspecified occlusion or stenosis of unspecified carotid arteries: Secondary | ICD-10-CM

## 2022-08-13 DIAGNOSIS — E785 Hyperlipidemia, unspecified: Secondary | ICD-10-CM | POA: Diagnosis not present

## 2022-08-13 DIAGNOSIS — I70211 Atherosclerosis of native arteries of extremities with intermittent claudication, right leg: Secondary | ICD-10-CM

## 2022-08-13 DIAGNOSIS — I10 Essential (primary) hypertension: Secondary | ICD-10-CM

## 2022-08-13 DIAGNOSIS — Z9889 Other specified postprocedural states: Secondary | ICD-10-CM

## 2022-08-13 DIAGNOSIS — I739 Peripheral vascular disease, unspecified: Secondary | ICD-10-CM | POA: Diagnosis not present

## 2022-08-13 NOTE — Assessment & Plan Note (Signed)
lipid control important in reducing the progression of atherosclerotic disease. Continue statin therapy  

## 2022-08-13 NOTE — Assessment & Plan Note (Signed)
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  Doing well.  At this point, we can follow-up on an annual basis with duplex.  No change in medications.

## 2022-08-13 NOTE — Assessment & Plan Note (Signed)
ABIs today are 1.06 on the right and 0.83 on the left.  Had a relatively early reocclusion after a right leg intervention treated with thrombolytic therapy and repeat intervention.  That was about a month ago and he is doing well.  Continue current medical regimen without changes.  Follow-up in 3 months with noninvasive studies.

## 2022-08-13 NOTE — Assessment & Plan Note (Signed)
blood glucose control important in reducing the progression of atherosclerotic disease. Also, involved in wound healing. On appropriate medications.  

## 2022-08-13 NOTE — Progress Notes (Signed)
MRN : [id number]  Dean King is a 77 y.o. (07-Feb-1946) male who presents with chief complaint of  Chief Complaint  Patient presents with   Follow-up    Ultrasound follow up  .  History of Present Illness: Patient returns in follow-up of multiple vascular issues.  Little over a year ago, he underwent left carotid stent placement.  He has done well from this.  He denies any focal neurologic symptoms.  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 He is also followed for his peripheral arterial disease.  He underwent extensive right lower extremity revascularization but had an early reocclusion and had to be brought back about 3 weeks ago for thrombolytic therapy and repeat intervention.  We were able to get him opened up.  His reperfusion swelling is gone down but not entirely resolved.  He has no signs of significant bleeding on anticoagulation.  He seems to be doing well with only mild claudication symptoms in his legs at this point. ABIs today are 1.06 on the right and 0.83 on the left.   Current Outpatient Medications  Medication Sig Dispense Refill   atorvastatin (LIPITOR) 40 MG tablet Take 1 tablet (40 mg total) by mouth daily. 30 tablet 2   Calcium Carbonate-Vit D-Min (CALCIUM 600+D3 PLUS MINERALS) 600-800 MG-UNIT TABS Take 1 tablet by mouth 3 (three) times daily.     clopidogrel (PLAVIX) 75 MG tablet Take 1 tablet (75 mg total) by mouth daily. 30 tablet 2   dapagliflozin propanediol (FARXIGA) 10 MG TABS tablet Take by mouth daily.     ELIQUIS 5 MG TABS tablet Take 1 tablet (5 mg total) by mouth 2 (two) times daily. 60 tablet 5   glipiZIDE (GLUCOTROL) 5 MG tablet Take 5 mg by mouth 2 (two) times daily before a meal.     losartan (COZAAR) 25 MG tablet Take 1 tablet (25 mg total) by mouth daily. 30 tablet 1   magnesium oxide (MAG-OX) 400 MG tablet Take 400 mg by mouth 2 (two) times daily.     metFORMIN  (GLUCOPHAGE) 500 MG tablet Take 1,000 mg by mouth 2 (two) times daily.     metoprolol succinate (TOPROL-XL) 100 MG 24 hr tablet Take 150 mg by mouth daily.     nitroGLYCERIN (NITROSTAT) 0.4 MG SL tablet Place 0.4 mg under the tongue as needed.     oxyCODONE-acetaminophen (PERCOCET/ROXICET) 5-325 MG tablet Take 1-2 tablets by mouth every 6 (six) hours as needed for moderate pain. 30 tablet 0   PROGRAF 0.5 MG capsule Take 0.5 mg by mouth 2 (two) times daily.     spironolactone (ALDACTONE) 25 MG tablet Take 25 mg by mouth daily.     No current facility-administered medications for this visit.    Past Medical History:  Diagnosis Date   AICD (automatic cardioverter/defibrillator) present    Diabetes mellitus without complication (HCC)    Hypertension     Past Surgical History:  Procedure Laterality Date   ABLATION  2023   CARDIAC CATHETERIZATION     CAROTID PTA/STENT INTERVENTION Left 04/05/2021   Procedure: CAROTID PTA/STENT INTERVENTION;  Surgeon: Annice Needy, MD;  Location: [facility];  Service: Cardiovascular;  Laterality: Left;   CHOLECYSTECTOMY     CORONARY ANGIOPLASTY     liver transplannt     LOWER EXTREMITY ANGIOGRAPHY Right 06/20/2022   Procedure: Lower Extremity Angiography;  Surgeon: Wyn Quaker,  Marlow Baars, MD;  Location: [facility];  Service: Cardiovascular;  Laterality: Right;   LOWER EXTREMITY ANGIOGRAPHY Right 07/25/2022   Procedure: Lower Extremity Angiography;  Surgeon: Annice Needy, MD;  Location: [facility];  Service: Cardiovascular;  Laterality: Right;   LOWER EXTREMITY INTERVENTION Right 07/26/2022   Procedure: LOWER EXTREMITY INTERVENTION;  Surgeon: Annice Needy, MD;  Location: [facility];  Service: Cardiovascular;  Laterality: Right;     Social History   Tobacco Use   Smoking status: Some Days    Types: Cigars   Smokeless tobacco: Never  Vaping Use   Vaping Use: Never used  Substance Use Topics   Alcohol use: Not Currently    Drug use: Never      Family History  Family history unknown: Yes     Allergies  Allergen Reactions   Levaquin [Levofloxacin]      REVIEW OF SYSTEMS (Negative unless checked)  Constitutional: [] Weight loss  [] Fever  [] Chills Cardiac: [] Chest pain   [] Chest pressure   [x] Palpitations   [] Shortness of breath when laying flat   [] Shortness of breath at rest   [] Shortness of breath with exertion. Vascular:  [x] Pain in legs with walking   [] Pain in legs at rest   [] Pain in legs when laying flat   [x] Claudication   [] Pain in feet when walking  [] Pain in feet at rest  [] Pain in feet when laying flat   [] History of DVT   [] Phlebitis   [x] Swelling in legs   [] Varicose veins   [] Non-healing ulcers Pulmonary:   [] Uses home oxygen   [] Productive cough   [] Hemoptysis   [] Wheeze  [] COPD   [] Asthma Neurologic:  [] Dizziness  [] Blackouts   [] Seizures   [] History of stroke   [] History of TIA  [] Aphasia   [] Temporary blindness   [] Dysphagia   [] Weakness or numbness in arms   [] Weakness or numbness in legs Musculoskeletal:  [x] Arthritis   [] Joint swelling   [x] Joint pain   [] Low back pain Hematologic:  [] Easy bruising  [] Easy bleeding   [] Hypercoagulable state   [] Anemic  [] Hepatitis Gastrointestinal:  [] Blood in stool   [] Vomiting blood  [] Gastroesophageal reflux/heartburn   [] Difficulty swallowing. Genitourinary:  [] Chronic kidney disease   [] Difficult urination  [] Frequent urination  [] Burning with urination   [] Blood in urine Skin:  [] Rashes   [] Ulcers   [] Wounds Psychological:  [] History of anxiety   []  History of major depression.  Physical Examination  Vitals:   08/13/22 1157  BP: 121/72  Pulse: 75  Resp: 16  Weight: 172 lb 12.8 oz (78.4 kg)   Body mass index is 24.79 kg/m. Gen:  WD/WN, NAD Head: [place]/AT, No temporalis wasting. Ear/Nose/Throat: Hearing grossly intact, nares w/o erythema or drainage, trachea midline Eyes: Conjunctiva clear. Sclera non-icteric Neck: Supple.  No bruit   Pulmonary:  Good air movement, equal and clear to auscultation bilaterally.  Cardiac: RRR, No JVD Vascular:  Vessel Right Left  Radial Palpable Palpable                          PT 1+ Palpable Not Palpable  DP 2+ Palpable 1+ Palpable   Gastrointestinal: soft, non-tender/non-distended. No guarding/reflex.  Musculoskeletal: M/S 5/5 throughout.  No deformity or atrophy. Mild RLE edema. Neurologic: CN 2-12 intact. Sensation grossly intact in extremities.  Symmetrical.  Speech is fluent. Motor exam as listed above. Psychiatric: Judgment intact, Mood & affect appropriate for pt's clinical situation. Dermatologic:  No rashes or ulcers noted.  No cellulitis or open wounds. Lymph : No Cervical, Axillary, or Inguinal lymphadenopathy.    CBC Lab Results  Component Value Date   WBC 11.7 (H) 07/26/2022   HGB 12.3 (L) 07/26/2022   HCT 36.7 (L) 07/26/2022   MCV 85.2 07/26/2022   PLT 158 07/26/2022    BMET    Component Value Date/Time   NA 137 07/26/2022 0403   K 4.4 07/26/2022 0403   CL 102 07/26/2022 0403   CO2 27 07/26/2022 0403   GLUCOSE 131 (H) 07/26/2022 0403   BUN 15 07/26/2022 0403   CREATININE 1.11 07/26/2022 0403   CALCIUM 9.1 07/26/2022 0403   GFRNONAA >60 07/26/2022 0403   Estimated Creatinine Clearance: 57.5 mL/min (by C-G formula based on SCr of 1.11 mg/dL).  COAG Lab Results  Component Value Date   INR 1.0 03/23/2021    Radiology PERIPHERAL VASCULAR CATHETERIZATION  Result Date: 07/26/2022 See surgical note for result.  PERIPHERAL VASCULAR CATHETERIZATION  Result Date: 07/25/2022 See surgical note for result.  VAS [place] ABI WITH/WO TBI  Result Date: 07/19/2022  LOWER EXTREMITY DOPPLER STUDY Patient Name:  KODE HEMMEN  Date of Exam:   07/18/2022 Medical Rec #: [id number]      Accession #:    [id number] Date of Birth: [birth year]      Patient Gender: M Patient Age:   86 years Exam Location:  [facility] Procedure:      VAS [place] ABI WITH/WO TBI  Referring Phys: Barbara Cower Demetrick Eichenberger --------------------------------------------------------------------------------  Indications: Ulceration, and peripheral artery disease. High Risk         Hypertension, Diabetes, current smoker, coronary artery Factors:          disease.  Performing Technologist: Hardie Lora RVT  Examination Guidelines: A complete evaluation includes at minimum, Doppler waveform signals and systolic blood pressure reading at the level of bilateral brachial, anterior tibial, and posterior tibial arteries, when vessel segments are accessible. Bilateral testing is considered an integral part of a complete examination. Photoelectric Plethysmograph (PPG) waveforms and toe systolic pressure readings are included as required and additional duplex testing as needed. Limited examinations for reoccurring indications may be performed as noted.  ABI Findings: +---------+------------------+-----+----------+--------+ Right    Rt Pressure (mmHg)IndexWaveform  Comment  +---------+------------------+-----+----------+--------+ Brachial 127                                       +---------+------------------+-----+----------+--------+ PTA      70                0.55 monophasic         +---------+------------------+-----+----------+--------+ DP       52                0.41 monophasic         +---------+------------------+-----+----------+--------+ Great Toe9                 0.07                    +---------+------------------+-----+----------+--------+ +---------+------------------+-----+----------+-------+ Left     Lt Pressure (mmHg)IndexWaveform  Comment +---------+------------------+-----+----------+-------+ Brachial 127                                      +---------+------------------+-----+----------+-------+ PTA      75  0.59 monophasic        +---------+------------------+-----+----------+-------+ DP       75                0.59 monophasic         +---------+------------------+-----+----------+-------+ Great Toe10                0.08                   +---------+------------------+-----+----------+-------+ +-------+-----------+-----------+------------+------------+ ABI/TBIToday's ABIToday's TBIPrevious ABIPrevious TBI +-------+-----------+-----------+------------+------------+ Right  0.55       0.07       0.66        0.38         +-------+-----------+-----------+------------+------------+ Left   0.59       0.08       0.62        0.33         +-------+-----------+-----------+------------+------------+  Limited imaging showed occluded right SFA stent and occluded left SFA. Bilateral and Right ABIs appear decreased compared to prior study on 05/29/2022. Left ABIs appear essentially unchanged compared to prior study on 05/29/2022.  Summary: Right: Resting right ankle-brachial index indicates moderate right lower extremity arterial disease. The right toe-brachial index is abnormal. Limited imaging showed occluded SFA stent. Left: Resting left ankle-brachial index indicates moderate left lower extremity arterial disease. The left toe-brachial index is abnormal. *See table(s) above for measurements and observations.  Electronically signed by [name] MD on 07/19/2022 at 11:00:38 AM.    Final      Assessment/Plan Carotid stenosis, symptomatic, with infarction [facility]) His carotid duplex today shows a right carotid stenosis in the 1 to 39% range and a left ICA stent without recurrent stenosis.  Doing well.  At this point, we can follow-up on an annual basis with duplex.  No change in medications.  Hypertension, benign blood pressure control important in reducing the progression of atherosclerotic disease. On appropriate oral medications.   Diabetes mellitus type 2 in nonobese (HCC) blood glucose control important in reducing the progression of atherosclerotic disease. Also, involved in wound healing. On appropriate  medications.   HLD (hyperlipidemia) lipid control important in reducing the progression of atherosclerotic disease. Continue statin therapy   Atherosclerosis of native arteries of extremity with intermittent claudication (HCC) ABIs today are 1.06 on the right and 0.83 on the left.  Had a relatively early reocclusion after a right leg intervention treated with thrombolytic therapy and repeat intervention.  That was about a month ago and he is doing well.  Continue current medical regimen without changes.  Follow-up in 3 months with noninvasive studies.    [name], MD  08/13/2022 5:06 PM    This note was created with Dragon medical transcription system.  Any errors from dictation are purely unintentional

## 2022-08-13 NOTE — Assessment & Plan Note (Signed)
blood pressure control important in reducing the progression of atherosclerotic disease. On appropriate oral medications.  

## 2022-08-15 LAB — VAS US ABI WITH/WO TBI
Left ABI: 0.83
Right ABI: 1.06

## 2022-09-02 ENCOUNTER — Ambulatory Visit (INDEPENDENT_AMBULATORY_CARE_PROVIDER_SITE_OTHER): Payer: Medicare Other | Admitting: Podiatry

## 2022-09-02 DIAGNOSIS — I739 Peripheral vascular disease, unspecified: Secondary | ICD-10-CM

## 2022-09-02 DIAGNOSIS — L97512 Non-pressure chronic ulcer of other part of right foot with fat layer exposed: Secondary | ICD-10-CM | POA: Diagnosis not present

## 2022-09-02 NOTE — Progress Notes (Unsigned)
  Subjective:  Patient ID: Dean King, male    DOB: 01/19/1946,  MRN: [id number]  Chief Complaint  Patient presents with   Foot Ulcer    Right foot 5th met, 3 week follow up    77 y.o. male presents with the above complaint. History confirmed with patient.  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  No cellulitis.      Assessment:   1. 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 PAD (peripheral artery disease) (HCC)      Plan:  Patient was evaluated and treated and all questions answered.   Ulcer right foot -We discussed the etiology and factors that are a part of the wound healing process.  We also discussed the risk of infection both soft tissue and osteomyelitis from open ulceration.  Discussed the risk of limb loss if this happens or worsens. -Debridement as below, utilized lidocaine ointment for topical anesthetic. -Dressed with Prisma, DSD. -Continue home dressing changes daily with Prisma collagen dressings  Procedure: Excisional Debridement of Wound Rationale: Removal of non-viable soft tissue from the wound to promote healing.  Anesthesia: none Post-Debridement Wound Measurements: Noted above Type of Debridement: Sharp Excisional Tissue Removed: Non-viable soft tissue Depth of Debridement: subcutaneous tissue. Technique: Sharp excisional debridement to bleeding, viable wound base.  Dressing: Dry, sterile, compression dressing. Disposition: Patient tolerated procedure well.    Return in about 3 weeks (around  09/23/2022) for wound care.

## 2022-09-16 ENCOUNTER — Telehealth: Payer: Self-pay | Admitting: Podiatry

## 2022-09-16 ENCOUNTER — Other Ambulatory Visit: Payer: Self-pay | Admitting: Podiatry

## 2022-09-16 MED ORDER — CEPHALEXIN 500 MG PO CAPS: 500.0000 mg | ORAL_CAPSULE | Freq: Four times a day (QID) | ORAL | 0 refills | Status: AC

## 2022-09-16 NOTE — Progress Notes (Signed)
PRN redness to the toe.  Recommend going to immediately to the emergency department if condition worsens

## 2022-09-16 NOTE — Telephone Encounter (Signed)
Patient called stating he saw Dr Lilian Kapur for wound care approix 2 weeks ago. Pt has follow up with Dr Allena Katz on 09/24/2022. Patient states his foot is very red and very warm to touch. Pt wanted to know if he could get an antibiotic or does he need to be seen. Dr Logan Bores on call, sent Dr Logan Bores a message. Per Dr Logan Bores he sent RX of Keflex to pharmacy. Called pt back and let him know RX was sent.

## 2022-09-17 ENCOUNTER — Other Ambulatory Visit (INDEPENDENT_AMBULATORY_CARE_PROVIDER_SITE_OTHER): Payer: Self-pay | Admitting: Vascular Surgery

## 2022-10-01 ENCOUNTER — Ambulatory Visit (INDEPENDENT_AMBULATORY_CARE_PROVIDER_SITE_OTHER): Payer: Medicare Other | Admitting: Podiatry

## 2022-10-01 DIAGNOSIS — L97512 Non-pressure chronic ulcer of other part of right foot with fat layer exposed: Secondary | ICD-10-CM | POA: Diagnosis not present

## 2022-10-01 DIAGNOSIS — I739 Peripheral vascular disease, unspecified: Secondary | ICD-10-CM

## 2022-10-01 NOTE — Progress Notes (Signed)
  Subjective:  Patient ID: Dean King, male    DOB: [birth year],  MRN: [id number]  Chief Complaint  Patient presents with   Foot Ulcer    77 y.o. male presents with the above complaint. History confirmed with patient.  Returns for follow-up says it is feeling a little bit better.  He would like to discuss other treatment options he is known to Dr. 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  No cellulitis.      Assessment:   No diagnosis found.    Plan:  Patient was evaluated and treated and all questions answered.   Ulcer right foot -We discussed the etiology and factors that are a part of the wound healing process.  We also discussed the risk of infection both soft tissue and osteomyelitis from open ulceration.  Discussed the risk of limb loss if this happens or worsens. -Debridement as below, utilized lidocaine ointment for topical anesthetic. -Dressed with Prisma, DSD. -Continue home dressing changes daily with Prisma collagen dressings -I will work on graft approval to begin application of Hellicol  graft to assist in the healing of the wound and efficient manner.  Patient states understand like to proceed with  Procedure: Excisional Debridement of Wound Rationale: Removal of non-viable soft tissue from the wound to promote healing.  Anesthesia: none Post-Debridement Wound Measurements: Noted above Type of Debridement: Sharp Excisional Tissue Removed: Non-viable soft tissue Depth of Debridement: subcutaneous tissue. Technique: Sharp  excisional debridement to bleeding, viable wound base.  Dressing: Dry, sterile, compression dressing. Disposition: Patient tolerated procedure well.    No follow-ups on file.

## 2022-10-08 ENCOUNTER — Ambulatory Visit (INDEPENDENT_AMBULATORY_CARE_PROVIDER_SITE_OTHER): Payer: Medicare Other | Admitting: Podiatry

## 2022-10-08 DIAGNOSIS — E119 Type 2 diabetes mellitus without complications: Secondary | ICD-10-CM | POA: Diagnosis not present

## 2022-10-08 DIAGNOSIS — L97512 Non-pressure chronic ulcer of other part of right foot with fat layer exposed: Secondary | ICD-10-CM

## 2022-10-08 NOTE — Progress Notes (Signed)
Application of Helicoll Graft: -arterial studies and/or Vascular intervention: This demonstrated the majority of the right lower extremity thrombus had resolved with tPA.  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  All 3 tibial vessels then had some flow distally after the proximal disease.  -Patient has severe morbidity: PVD, MD -Patient was also nutritionally optimized and encouraged protein rich diet -Smoking cessation was discussed in extensive detail.  -Wound start date: 04/23/22  -Interventions attempted: Betadine wet to dry/local wound care, offloading, immobilization  -Decision was made to use bovine graft since partial / full thickness skin grafts such as Theraskin can lead to scarring and wound recurrence. The clinic did not use any synthetic grafts due to higher chance of foreign body reactions /rejection. Cryo-preserved amniotic graft was not used since storage infrastructure is not available at this outpatient clinic and the patient is unwilling to go to a wound care center. Grafts requiring upfront purchase were not considered due to financial limitations of this outpatient clinic and thus only grafts available on consignment were considered. Bovine graft was chosen based on various characteristics including but not limited to scaffolding properties, heavy chain hyaluronic acid, cytokines, and grown factors to expedite wound healing.  Graft application: # 1 -The wound was cleansed with saline. Selective debridement techniques to remove devitalized tissue was performed through subcutaneous tissue with 15 blade until viable bleeding tissue was encountered. -Topical application of Bovine Helicoll graft was placed over the wound and dressed with adaptic non-adherent, gauze, kling and ACE  bandage. -Product Code:[id number] -Expiration: 24-Apr-2025 -Lot# [id number]    Procedure: Excisional Debridement of Wound Tool: Sharp chisel blade/tissue nipper Rationale: Removal of non-viable soft tissue from the wound to promote healing.  Anesthesia: none Pre-Debridement Wound Measurements: 2.0 cm x 1.5 cm x 0.3 cm  Post-Debridement Wound Measurements: 2.0 cm x 1.5 cm x 0.3 cm  Type of Debridement: Sharp Excisional Tissue Removed: Non-viable soft tissue Blood loss: Minimal (<50cc) Depth of Debridement: subcutaneous tissue. Technique: Sharp excisional debridement to bleeding, viable wound base.  Wound Progress: This is is my initial application. Dressing: Dry, sterile, compression dressing. Disposition: Patient tolerated procedure well. Patient to return in 1 week for follow-up.  -The entire graft was utilized and non discarded. Graft was used in wound described above. I folded the graft in order to maximize amount of product used for the wound and maximize benefit from scaffold properties of the amniotic graft.

## 2022-10-17 ENCOUNTER — Ambulatory Visit (INDEPENDENT_AMBULATORY_CARE_PROVIDER_SITE_OTHER): Payer: Medicare Other | Admitting: Podiatry

## 2022-10-17 DIAGNOSIS — L97512 Non-pressure chronic ulcer of other part of right foot with fat layer exposed: Secondary | ICD-10-CM

## 2022-10-17 NOTE — Progress Notes (Signed)
Application of Helicoll Graft: -arterial studies and/or Vascular intervention: This demonstrated the majority of the right lower extremity thrombus had resolved with tPA.  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  All 3 tibial vessels then had some flow distally after the proximal disease.  -Patient has severe morbidity: PVD, MD -Patient was also nutritionally optimized and encouraged protein rich diet -Smoking cessation was discussed in extensive detail.  -Wound start date: 04/23/22  -Interventions attempted: Betadine wet to dry/local wound care, offloading, immobilization  -Decision was made to use bovine graft since partial / full thickness skin grafts such as Theraskin can lead to scarring and wound recurrence. The clinic did not use any synthetic grafts due to higher chance of foreign body reactions /rejection. Cryo-preserved amniotic graft was not used since storage infrastructure is not available at this outpatient clinic and the patient is unwilling to go to a wound care center. Grafts requiring upfront purchase were not considered due to financial limitations of this outpatient clinic and thus only grafts available on consignment were considered. Bovine graft was chosen based on various characteristics including but not limited to scaffolding properties, heavy chain hyaluronic acid, cytokines, and grown factors to expedite wound healing.  Graft application: # 2 -The wound was cleansed with saline. Selective debridement techniques to remove devitalized tissue was performed through subcutaneous tissue with 15 blade until viable bleeding tissue was encountered. -Topical application of Bovine Helicoll graft was placed over the wound and dressed with adaptic non-adherent, gauze, kling and ACE  bandage. -Product Code:[id number] -Expiration: 24-Apr-2025 -Lot# [id number]    Procedure: Excisional Debridement of Wound Tool: Sharp chisel blade/tissue nipper Rationale: Removal of non-viable soft tissue from the wound to promote healing.  Anesthesia: none Pre-Debridement Wound Measurements: 1.5 cm x 1.3 cm x 0.3 cm  Post-Debridement Wound Measurements: 1.5 cm x 1.3 cm x 0.3 cm  Type of Debridement: Sharp Excisional Tissue Removed: Non-viable soft tissue Blood loss: Minimal (<50cc) Depth of Debridement: subcutaneous tissue. Technique: Sharp excisional debridement to bleeding, viable wound base.  Wound Progress: This is is my initial application. Dressing: Dry, sterile, compression dressing. Disposition: Patient tolerated procedure well. Patient to return in 1 week for follow-up.  -2 units were discarded.  4 units used.  Graft was used in wound described above. I folded the graft in order to maximize amount of product used for the wound and maximize benefit from scaffold properties of the amniotic graft.

## 2022-10-24 ENCOUNTER — Ambulatory Visit (INDEPENDENT_AMBULATORY_CARE_PROVIDER_SITE_OTHER): Payer: Medicare Other | Admitting: Podiatry

## 2022-10-24 DIAGNOSIS — L97512 Non-pressure chronic ulcer of other part of right foot with fat layer exposed: Secondary | ICD-10-CM | POA: Diagnosis not present

## 2022-10-24 NOTE — Progress Notes (Signed)
Application of Helicoll Graft: -arterial studies and/or Vascular intervention: This demonstrated the majority of the right lower extremity thrombus had resolved with tPA.  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  All 3 tibial vessels then had some flow distally after the proximal disease.  -Patient has severe morbidity: PVD, MD -Patient was also nutritionally optimized and encouraged protein rich diet -Smoking cessation was discussed in extensive detail.  -Wound start date: 04/23/22  -Interventions attempted: Betadine wet to dry/local wound care, offloading, immobilization  -Decision was made to use bovine graft since partial / full thickness skin grafts such as Theraskin can lead to scarring and wound recurrence. The clinic did not use any synthetic grafts due to higher chance of foreign body reactions /rejection. Cryo-preserved amniotic graft was not used since storage infrastructure is not available at this outpatient clinic and the patient is unwilling to go to a wound care center. Grafts requiring upfront purchase were not considered due to financial limitations of this outpatient clinic and thus only grafts available on consignment were considered. Bovine graft was chosen based on various characteristics including but not limited to scaffolding properties, heavy chain hyaluronic acid, cytokines, and grown factors to expedite wound healing.  Graft application: # 3 -The wound was cleansed with saline. Selective debridement techniques to remove devitalized tissue was performed through subcutaneous tissue with 15 blade until viable bleeding tissue was encountered. -Topical application of Bovine Helicoll graft was placed over the wound and dressed with adaptic non-adherent, gauze, kling and ACE  bandage. -Product Code:[id number] and [id number] -Expiration: 22-Dec-2024 and 22-Nov-2024 -Lot# [id number]    Procedure: Excisional Debridement of Wound Tool: Sharp chisel blade/tissue nipper Rationale: Removal of non-viable soft tissue from the wound to promote healing.  Anesthesia: none Pre-Debridement Wound Measurements: 1.0 cm x 0.7 cm x 0.3 cm  Post-Debridement Wound Measurements: 1.0 cm x 0.7 cm x 0.3 cm  Type of Debridement: Sharp Excisional Tissue Removed: Non-viable soft tissue Blood loss: Minimal (<50cc) Depth of Debridement: subcutaneous tissue. Technique: Sharp excisional debridement to bleeding, viable wound base.  Wound Progress: Wound size is decreasing Dressing: Dry, sterile, compression dressing. Disposition: Patient tolerated procedure well. Patient to return in 1 week for follow-up.  -0 units were discarded.  4 units used.  Graft was used in wound described above. I folded the graft in order to maximize amount of product used for the wound and maximize benefit from scaffold properties of the amniotic graft.

## 2022-10-31 ENCOUNTER — Ambulatory Visit (INDEPENDENT_AMBULATORY_CARE_PROVIDER_SITE_OTHER): Payer: Medicare Other | Admitting: Podiatry

## 2022-10-31 DIAGNOSIS — L97512 Non-pressure chronic ulcer of other part of right foot with fat layer exposed: Secondary | ICD-10-CM | POA: Diagnosis not present

## 2022-10-31 DIAGNOSIS — L539 Erythematous condition, unspecified: Secondary | ICD-10-CM | POA: Diagnosis not present

## 2022-10-31 MED ORDER — DOXYCYCLINE HYCLATE 100 MG PO TABS
100.0000 mg | ORAL_TABLET | Freq: Two times a day (BID) | ORAL | 0 refills | Status: DC
Start: 2022-10-31 — End: 2023-01-15

## 2022-10-31 NOTE — Progress Notes (Signed)
  Subjective:  Patient ID: Dean King, male    DOB: Aug 06, 1945,  MRN: [id number]  Chief Complaint  Patient presents with   Foot Ulcer    77 y.o. male presents with the above complaint. Patient states that there is infection present cause it has been hurting lot more.  He is not currently on any antibiotics.  He is here is for his graft application.  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  Does not probe down to bone..  No cellulitis.  No malodor present    Assessment:   1. 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 Erythema       Plan:  Patient was evaluated and treated and all questions answered.   Ulcer right foot with acute superficial infection -At this time we will do a graft holiday and allowed the patient to clear out the infection.  I will place him on doxycycline for next 30 days as well as change the bandage and dressing to Betadine wet-to-dry dressing change daily.  I instructed the this to the patient extensively. -This may likely lead to increasing wound size due to the infection.  Will resume graft application once patient has cleared the infection. -Continue wearing surgical shoe and offloading   No follow-ups on file.

## 2022-11-13 ENCOUNTER — Other Ambulatory Visit (INDEPENDENT_AMBULATORY_CARE_PROVIDER_SITE_OTHER): Payer: Self-pay | Admitting: Vascular Surgery

## 2022-11-13 DIAGNOSIS — I70211 Atherosclerosis of native arteries of extremities with intermittent claudication, right leg: Secondary | ICD-10-CM

## 2022-11-14 ENCOUNTER — Ambulatory Visit (INDEPENDENT_AMBULATORY_CARE_PROVIDER_SITE_OTHER): Payer: Medicare Other | Admitting: Podiatry

## 2022-11-14 DIAGNOSIS — L97512 Non-pressure chronic ulcer of other part of right foot with fat layer exposed: Secondary | ICD-10-CM

## 2022-11-14 DIAGNOSIS — L539 Erythematous condition, unspecified: Secondary | ICD-10-CM

## 2022-11-14 NOTE — Progress Notes (Signed)
  Subjective:  Patient ID: Dean King, male    DOB: [birth year],  MRN: [id number]  Chief Complaint  Patient presents with   Foot Ulcer    77 y.o. male presents with the above complaint. Patient states that there is infection present cause it has been hurting lot more.  He is not currently on any antibiotics.  He is here is for his graft application.  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     Plan:  Patient was evaluated and treated and all questions answered.   Ulcer right foot with acute superficial infection -Clinically doing better.  Continue Betadine wet-to-dry dressing.  Finish a course of antibiotics.  I will see him back again in 3 weeks and see if patient is ready for graft application.   No follow-ups on file.

## 2022-11-15 ENCOUNTER — Ambulatory Visit (INDEPENDENT_AMBULATORY_CARE_PROVIDER_SITE_OTHER): Payer: Medicare Other | Admitting: Vascular Surgery

## 2022-11-15 ENCOUNTER — Encounter (INDEPENDENT_AMBULATORY_CARE_PROVIDER_SITE_OTHER): Payer: Medicare Other

## 2022-12-05 ENCOUNTER — Ambulatory Visit (INDEPENDENT_AMBULATORY_CARE_PROVIDER_SITE_OTHER): Payer: Medicare Other | Admitting: Podiatry

## 2022-12-05 DIAGNOSIS — L97512 Non-pressure chronic ulcer of other part of right foot with fat layer exposed: Secondary | ICD-10-CM | POA: Diagnosis not present

## 2022-12-05 NOTE — Progress Notes (Signed)
Application of Helicoll Graft: -arterial studies and/or Vascular intervention: This demonstrated the majority of the right lower extremity thrombus had resolved with tPA.  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  All 3 tibial vessels then had some flow distally after the proximal disease.  -Patient has severe morbidity: PVD, MD -Patient was also nutritionally optimized and encouraged protein rich diet -Smoking cessation was discussed in extensive detail.  -Wound start date: 04/23/22  -Interventions attempted: Betadine wet to dry/local wound care, offloading, immobilization  -Decision was made to use bovine graft since partial / full thickness skin grafts such as Theraskin can lead to scarring and wound recurrence. The clinic did not use any synthetic grafts due to higher chance of foreign body reactions /rejection. Cryo-preserved amniotic graft was not used since storage infrastructure is not available at this outpatient clinic and the patient is unwilling to go to a wound care center. Grafts requiring upfront purchase were not considered due to financial limitations of this outpatient clinic and thus only grafts available on consignment were considered. Bovine graft was chosen based on various characteristics including but not limited to scaffolding properties, heavy chain hyaluronic acid, cytokines, and grown factors to expedite wound healing.  Graft application: # 1 -The wound was cleansed with saline. Selective debridement techniques to remove devitalized tissue was performed through subcutaneous tissue with 15 blade until viable bleeding tissue was encountered. -Topical application of Bovine Helicoll graft was placed over the wound and dressed with adaptic non-adherent, gauze, kling and ACE  bandage. -Product Code:[id number] -Expiration: 22-Dec-2024 -Lot# [id number] -The wound improved clinically considerably.  At this time final application of graft will be applied if there is no improvement no further graft will be applied.    Procedure: Excisional Debridement of Wound Tool: Sharp chisel blade/tissue nipper Rationale: Removal of non-viable soft tissue from the wound to promote healing.  Anesthesia: none Pre-Debridement Wound Measurements: 0.5 cm x 0.5 cm x 0.3 cm  Post-Debridement Wound Measurements: 0.5 cm x 0.5 cm x 0.3 cm  Type of Debridement: Sharp Excisional Tissue Removed: Non-viable soft tissue Blood loss: Minimal (<50cc) Depth of Debridement: subcutaneous tissue. Technique: Sharp excisional debridement to bleeding, viable wound base.  Wound Progress: Wound size is decreasing Dressing: Dry, sterile, compression dressing. Disposition: Patient tolerated procedure well. Patient to return in 1 week for follow-up.  -0 units were discarded.  2 units used.  Graft was used in wound described above. I folded the graft in order to maximize amount of product used for the wound and maximize benefit from scaffold properties of the amniotic graft.

## 2022-12-11 ENCOUNTER — Encounter (INDEPENDENT_AMBULATORY_CARE_PROVIDER_SITE_OTHER): Payer: Self-pay | Admitting: Vascular Surgery

## 2023-01-02 ENCOUNTER — Encounter: Payer: Self-pay | Admitting: Podiatry

## 2023-01-02 ENCOUNTER — Ambulatory Visit: Payer: Medicare Other | Admitting: Podiatry

## 2023-01-02 VITALS — BP 100/53 | HR 78

## 2023-01-02 DIAGNOSIS — E119 Type 2 diabetes mellitus without complications: Secondary | ICD-10-CM | POA: Diagnosis not present

## 2023-01-02 DIAGNOSIS — L97512 Non-pressure chronic ulcer of other part of right foot with fat layer exposed: Secondary | ICD-10-CM

## 2023-01-02 NOTE — Progress Notes (Signed)
Subjective:  Patient ID: Dean King, male    DOB: February 04, 1946,  MRN: [id number]  Chief Complaint  Patient presents with   Wound Check    "It's doing better.  I hope he's not planning on cutting on it today.  If so, he minds well put it up.  The last time he did it I couldn't walk on it for at least a week."    77 y.o. male presents with the above complaint.  Patient is doing a lot better the skin is looking much better.  Has been new iodine dressing.  Objective:  Physical Exam: warm, good capillary refill, no trophic changes or ulcerative lesions, weakly palpable DP and PT pulses, normal monofilament exam, normal sensory exam,.  The ulcer has completely reepithelialized.  No signs of Deis is noted no complication noted.  No recurrence noted.  The skin has completely epithelialized.   Assessment:   No diagnosis found.     Plan:  Patient was evaluated and treated and all questions answered.   Ulcer right foot with acute superficial infection -Clinically healed and officially discharged from my care.  Patient ulceration has completely reepithelialized.  No signs of Deis is noted no complication noted no recurrence of wound noted.   No follow-ups on file.

## 2023-01-08 ENCOUNTER — Encounter: Payer: Medicare Other | Attending: Cardiovascular Disease | Admitting: *Deleted

## 2023-01-08 DIAGNOSIS — I5022 Chronic systolic (congestive) heart failure: Secondary | ICD-10-CM

## 2023-01-08 NOTE — Progress Notes (Signed)
Initial phone call completed. Diagnosis can be found in [facility] 9/10. EP Orientation scheduled for Monday 10/28 at 1:30.

## 2023-01-13 ENCOUNTER — Ambulatory Visit: Payer: Medicare Other

## 2023-01-14 ENCOUNTER — Inpatient Hospital Stay: Payer: Medicare Other

## 2023-01-14 ENCOUNTER — Encounter: Payer: Self-pay | Admitting: Podiatry

## 2023-01-14 ENCOUNTER — Ambulatory Visit (INDEPENDENT_AMBULATORY_CARE_PROVIDER_SITE_OTHER): Payer: Medicare Other | Admitting: Podiatry

## 2023-01-14 ENCOUNTER — Inpatient Hospital Stay
Admission: EM | Admit: 2023-01-14 | Discharge: 2023-01-18 | DRG: 617 | Disposition: A | Discharge status: Home / Self Care | Payer: Medicare Other | Attending: Internal Medicine | Admitting: Internal Medicine

## 2023-01-14 ENCOUNTER — Other Ambulatory Visit: Payer: Self-pay

## 2023-01-14 VITALS — BP 119/65 | HR 79

## 2023-01-14 DIAGNOSIS — L97519 Non-pressure chronic ulcer of other part of right foot with unspecified severity: Secondary | ICD-10-CM | POA: Diagnosis present

## 2023-01-14 DIAGNOSIS — Z7984 Long term (current) use of oral hypoglycemic drugs: Secondary | ICD-10-CM | POA: Diagnosis not present

## 2023-01-14 DIAGNOSIS — E785 Hyperlipidemia, unspecified: Secondary | ICD-10-CM | POA: Diagnosis present

## 2023-01-14 DIAGNOSIS — L97512 Non-pressure chronic ulcer of other part of right foot with fat layer exposed: Secondary | ICD-10-CM | POA: Diagnosis not present

## 2023-01-14 DIAGNOSIS — Z881 Allergy status to other antibiotic agents status: Secondary | ICD-10-CM

## 2023-01-14 DIAGNOSIS — E1151 Type 2 diabetes mellitus with diabetic peripheral angiopathy without gangrene: Secondary | ICD-10-CM | POA: Diagnosis present

## 2023-01-14 DIAGNOSIS — Z89421 Acquired absence of other right toe(s): Secondary | ICD-10-CM | POA: Diagnosis not present

## 2023-01-14 DIAGNOSIS — M009 Pyogenic arthritis, unspecified: Secondary | ICD-10-CM | POA: Diagnosis present

## 2023-01-14 DIAGNOSIS — Z7901 Long term (current) use of anticoagulants: Secondary | ICD-10-CM

## 2023-01-14 DIAGNOSIS — I252 Old myocardial infarction: Secondary | ICD-10-CM

## 2023-01-14 DIAGNOSIS — Z79899 Other long term (current) drug therapy: Secondary | ICD-10-CM | POA: Diagnosis not present

## 2023-01-14 DIAGNOSIS — I70263 Atherosclerosis of native arteries of extremities with gangrene, bilateral legs: Secondary | ICD-10-CM | POA: Diagnosis present

## 2023-01-14 DIAGNOSIS — Z9049 Acquired absence of other specified parts of digestive tract: Secondary | ICD-10-CM

## 2023-01-14 DIAGNOSIS — I2510 Atherosclerotic heart disease of native coronary artery without angina pectoris: Secondary | ICD-10-CM | POA: Diagnosis present

## 2023-01-14 DIAGNOSIS — Z95810 Presence of automatic (implantable) cardiac defibrillator: Secondary | ICD-10-CM

## 2023-01-14 DIAGNOSIS — E119 Type 2 diabetes mellitus without complications: Secondary | ICD-10-CM

## 2023-01-14 DIAGNOSIS — E11621 Type 2 diabetes mellitus with foot ulcer: Secondary | ICD-10-CM | POA: Diagnosis present

## 2023-01-14 DIAGNOSIS — I480 Paroxysmal atrial fibrillation: Secondary | ICD-10-CM | POA: Diagnosis present

## 2023-01-14 DIAGNOSIS — I10 Essential (primary) hypertension: Secondary | ICD-10-CM | POA: Diagnosis present

## 2023-01-14 DIAGNOSIS — L02611 Cutaneous abscess of right foot: Secondary | ICD-10-CM

## 2023-01-14 DIAGNOSIS — I110 Hypertensive heart disease with heart failure: Secondary | ICD-10-CM | POA: Diagnosis present

## 2023-01-14 DIAGNOSIS — I70235 Atherosclerosis of native arteries of right leg with ulceration of other part of foot: Secondary | ICD-10-CM | POA: Diagnosis not present

## 2023-01-14 DIAGNOSIS — L97929 Non-pressure chronic ulcer of unspecified part of left lower leg with unspecified severity: Secondary | ICD-10-CM | POA: Diagnosis present

## 2023-01-14 DIAGNOSIS — L03115 Cellulitis of right lower limb: Secondary | ICD-10-CM | POA: Diagnosis present

## 2023-01-14 DIAGNOSIS — M86471 Chronic osteomyelitis with draining sinus, right ankle and foot: Secondary | ICD-10-CM | POA: Diagnosis not present

## 2023-01-14 DIAGNOSIS — Z7902 Long term (current) use of antithrombotics/antiplatelets: Secondary | ICD-10-CM

## 2023-01-14 DIAGNOSIS — I5022 Chronic systolic (congestive) heart failure: Secondary | ICD-10-CM | POA: Diagnosis present

## 2023-01-14 DIAGNOSIS — E1152 Type 2 diabetes mellitus with diabetic peripheral angiopathy with gangrene: Secondary | ICD-10-CM | POA: Diagnosis present

## 2023-01-14 DIAGNOSIS — E1169 Type 2 diabetes mellitus with other specified complication: Secondary | ICD-10-CM | POA: Diagnosis present

## 2023-01-14 DIAGNOSIS — J449 Chronic obstructive pulmonary disease, unspecified: Secondary | ICD-10-CM | POA: Diagnosis present

## 2023-01-14 DIAGNOSIS — M86171 Other acute osteomyelitis, right ankle and foot: Secondary | ICD-10-CM

## 2023-01-14 DIAGNOSIS — I5020 Unspecified systolic (congestive) heart failure: Secondary | ICD-10-CM | POA: Diagnosis present

## 2023-01-14 DIAGNOSIS — Z944 Liver transplant status: Secondary | ICD-10-CM | POA: Diagnosis not present

## 2023-01-14 DIAGNOSIS — Z9889 Other specified postprocedural states: Secondary | ICD-10-CM | POA: Diagnosis not present

## 2023-01-14 DIAGNOSIS — M86671 Other chronic osteomyelitis, right ankle and foot: Secondary | ICD-10-CM | POA: Diagnosis present

## 2023-01-14 DIAGNOSIS — D72829 Elevated white blood cell count, unspecified: Secondary | ICD-10-CM | POA: Diagnosis present

## 2023-01-14 DIAGNOSIS — Z9861 Coronary angioplasty status: Secondary | ICD-10-CM

## 2023-01-14 DIAGNOSIS — N179 Acute kidney failure, unspecified: Secondary | ICD-10-CM | POA: Diagnosis present

## 2023-01-14 DIAGNOSIS — Z95828 Presence of other vascular implants and grafts: Secondary | ICD-10-CM | POA: Diagnosis not present

## 2023-01-14 DIAGNOSIS — I70211 Atherosclerosis of native arteries of extremities with intermittent claudication, right leg: Secondary | ICD-10-CM | POA: Diagnosis not present

## 2023-01-14 DIAGNOSIS — I70219 Atherosclerosis of native arteries of extremities with intermittent claudication, unspecified extremity: Secondary | ICD-10-CM | POA: Diagnosis present

## 2023-01-14 DIAGNOSIS — F17290 Nicotine dependence, other tobacco product, uncomplicated: Secondary | ICD-10-CM | POA: Diagnosis present

## 2023-01-14 DIAGNOSIS — G459 Transient cerebral ischemic attack, unspecified: Secondary | ICD-10-CM | POA: Diagnosis present

## 2023-01-14 DIAGNOSIS — M869 Osteomyelitis, unspecified: Secondary | ICD-10-CM | POA: Diagnosis present

## 2023-01-14 DIAGNOSIS — I429 Cardiomyopathy, unspecified: Secondary | ICD-10-CM

## 2023-01-14 DIAGNOSIS — I70202 Unspecified atherosclerosis of native arteries of extremities, left leg: Secondary | ICD-10-CM | POA: Diagnosis not present

## 2023-01-14 HISTORY — DX: Acute myocardial infarction, unspecified: I21.9

## 2023-01-14 LAB — SODIUM, URINE, RANDOM: Sodium, Ur: 68 mmol/L

## 2023-01-14 LAB — PROTIME-INR
INR: 1.6 — ABNORMAL HIGH (ref 0.8–1.2)
Prothrombin Time: 19.3 s — ABNORMAL HIGH (ref 11.4–15.2)

## 2023-01-14 LAB — BASIC METABOLIC PANEL
Anion gap: 13 (ref 5–15)
BUN: 36 mg/dL — ABNORMAL HIGH (ref 8–23)
CO2: 23 mmol/L (ref 22–32)
Calcium: 9.5 mg/dL (ref 8.9–10.3)
Chloride: 98 mmol/L (ref 98–111)
Creatinine, Ser: 1.73 mg/dL — ABNORMAL HIGH (ref 0.61–1.24)
GFR, Estimated: 40 mL/min — ABNORMAL LOW (ref >60)
Glucose, Bld: 211 mg/dL — ABNORMAL HIGH (ref 70–99)
Potassium: 4.1 mmol/L (ref 3.5–5.1)
Sodium: 134 mmol/L — ABNORMAL LOW (ref 135–145)

## 2023-01-14 LAB — CBC
HCT: 36 % — ABNORMAL LOW (ref 39.0–52.0)
Hemoglobin: 11.6 g/dL — ABNORMAL LOW (ref 13.0–17.0)
MCH: 26.1 pg (ref 26.0–34.0)
MCHC: 32.2 g/dL (ref 30.0–36.0)
MCV: 81.1 fL (ref 80.0–100.0)
Platelets: 289 10*3/uL (ref 150–400)
RBC: 4.44 MIL/uL (ref 4.22–5.81)
RDW: 20.7 % — ABNORMAL HIGH (ref 11.5–15.5)
WBC: 12.8 10*3/uL — ABNORMAL HIGH (ref 4.0–10.5)
nRBC: 0 % (ref 0.0–0.2)

## 2023-01-14 LAB — HEMOGLOBIN A1C
Hgb A1c MFr Bld: 7.8 % — ABNORMAL HIGH (ref 4.8–5.6)
Mean Plasma Glucose: 177.16 mg/dL

## 2023-01-14 LAB — GLUCOSE, CAPILLARY: Glucose-Capillary: 102 mg/dL — ABNORMAL HIGH (ref 70–99)

## 2023-01-14 LAB — CBG MONITORING, ED
Glucose-Capillary: 160 mg/dL — ABNORMAL HIGH (ref 70–99)
Glucose-Capillary: 119 mg/dL — ABNORMAL HIGH (ref 70–99)

## 2023-01-14 LAB — HEPARIN LEVEL (UNFRACTIONATED): Heparin Unfractionated: >1.1 [IU]/mL — ABNORMAL HIGH (ref 0.30–0.70)

## 2023-01-14 LAB — APTT: aPTT: 28 s (ref 24–36)

## 2023-01-14 LAB — CREATININE, URINE, RANDOM: Creatinine, Urine: 41 mg/dL

## 2023-01-14 MED ORDER — MAGNESIUM OXIDE -MG SUPPLEMENT 400 (240 MG) MG PO TABS
400.0000 mg | ORAL_TABLET | Freq: Two times a day (BID) | ORAL | Status: DC
  Administered 2023-01-14 – 2023-01-18 (×6): 400 mg via ORAL
  Filled 2023-01-14 (×6): qty 1

## 2023-01-14 MED ORDER — BISACODYL 5 MG PO TBEC: 5.0000 mg | DELAYED_RELEASE_TABLET | Freq: Every day | ORAL | Status: DC | PRN

## 2023-01-14 MED ORDER — LORAZEPAM 0.5 MG PO TABS: 0.5000 mg | ORAL_TABLET | Freq: Four times a day (QID) | ORAL | Status: DC | PRN

## 2023-01-14 MED ORDER — SPIRONOLACTONE 12.5 MG HALF TABLET: 12.5000 mg | ORAL_TABLET | Freq: Every day | ORAL | Status: DC

## 2023-01-14 MED ORDER — SACUBITRIL-VALSARTAN 24-26 MG PO TABS: 1.0000 | ORAL_TABLET | Freq: Two times a day (BID) | ORAL | Status: DC

## 2023-01-14 MED ORDER — SODIUM CHLORIDE 0.9 % IV SOLN
2.0000 g | Freq: Once | INTRAVENOUS | Status: AC
  Administered 2023-01-14: 2 g via INTRAVENOUS
  Filled 2023-01-14 (×2): qty 20

## 2023-01-14 MED ORDER — OXYCODONE HCL 5 MG PO TABS
5.0000 mg | ORAL_TABLET | Freq: Once | ORAL | Status: AC
  Administered 2023-01-14: 5 mg via ORAL
  Filled 2023-01-14: qty 1

## 2023-01-14 MED ORDER — METOPROLOL SUCCINATE ER 50 MG PO TB24: 150.0000 mg | ORAL_TABLET | Freq: Every day | ORAL | Status: DC

## 2023-01-14 MED ORDER — FUROSEMIDE 40 MG PO TABS
80.0000 mg | ORAL_TABLET | Freq: Every day | ORAL | Status: DC
  Administered 2023-01-15 – 2023-01-18 (×2): 80 mg via ORAL
  Filled 2023-01-14 (×2): qty 2

## 2023-01-14 MED ORDER — HYDROMORPHONE HCL 1 MG/ML IJ SOLN
0.5000 mg | INTRAMUSCULAR | Status: DC | PRN
  Administered 2023-01-16 – 2023-01-18 (×5): 1 mg via INTRAVENOUS
  Filled 2023-01-14 (×6): qty 1

## 2023-01-14 MED ORDER — VANCOMYCIN VARIABLE DOSE PER UNSTABLE RENAL FUNCTION (PHARMACIST DOSING): Status: DC

## 2023-01-14 MED ORDER — OXYCODONE-ACETAMINOPHEN 5-325 MG PO TABS
1.0000 | ORAL_TABLET | Freq: Four times a day (QID) | ORAL | Status: DC | PRN
  Administered 2023-01-14 – 2023-01-15 (×2): 1 via ORAL
  Administered 2023-01-15 – 2023-01-16 (×2): 2 via ORAL
  Administered 2023-01-17 – 2023-01-18 (×2): 1 via ORAL
  Filled 2023-01-14 (×2): qty 1
  Filled 2023-01-14 (×2): qty 2
  Filled 2023-01-14: qty 1
  Filled 2023-01-14: qty 2

## 2023-01-14 MED ORDER — HEPARIN (PORCINE) 25000 UT/250ML-% IV SOLN
1150.0000 [IU]/h | INTRAVENOUS | Status: DC
  Administered 2023-01-14: 1000 [IU]/h via INTRAVENOUS
  Administered 2023-01-16: 1150 [IU]/h via INTRAVENOUS
  Filled 2023-01-14 (×3): qty 250

## 2023-01-14 MED ORDER — ATORVASTATIN CALCIUM 20 MG PO TABS
40.0000 mg | ORAL_TABLET | Freq: Every day | ORAL | Status: DC
  Administered 2023-01-14 – 2023-01-18 (×3): 40 mg via ORAL
  Filled 2023-01-14 (×3): qty 2

## 2023-01-14 MED ORDER — ONDANSETRON HCL 4 MG PO TABS: 4.0000 mg | ORAL_TABLET | Freq: Four times a day (QID) | ORAL | Status: DC | PRN

## 2023-01-14 MED ORDER — SPIRONOLACTONE 12.5 MG HALF TABLET
12.5000 mg | ORAL_TABLET | Freq: Every day | ORAL | Status: DC
  Administered 2023-01-15 – 2023-01-18 (×2): 12.5 mg via ORAL
  Filled 2023-01-14 (×4): qty 1

## 2023-01-14 MED ORDER — VANCOMYCIN HCL 750 MG/150ML IV SOLN
750.0000 mg | Freq: Once | INTRAVENOUS | Status: AC
  Administered 2023-01-14: 750 mg via INTRAVENOUS
  Filled 2023-01-14: qty 150

## 2023-01-14 MED ORDER — ONDANSETRON HCL 4 MG/2ML IJ SOLN: 4.0000 mg | Freq: Four times a day (QID) | INTRAMUSCULAR | Status: DC | PRN

## 2023-01-14 MED ORDER — VANCOMYCIN HCL IN DEXTROSE 1-5 GM/200ML-% IV SOLN
1000.0000 mg | Freq: Once | INTRAVENOUS | Status: AC
  Administered 2023-01-14: 1000 mg via INTRAVENOUS
  Filled 2023-01-14: qty 200

## 2023-01-14 MED ORDER — INSULIN ASPART 100 UNIT/ML IJ SOLN
0.0000 [IU] | Freq: Every day | INTRAMUSCULAR | Status: DC
  Administered 2023-01-15 – 2023-01-17 (×2): 2 [IU] via SUBCUTANEOUS
  Filled 2023-01-14 (×2): qty 1

## 2023-01-14 MED ORDER — CLOPIDOGREL BISULFATE 75 MG PO TABS
75.0000 mg | ORAL_TABLET | Freq: Every day | ORAL | Status: DC
  Administered 2023-01-14 – 2023-01-18 (×3): 75 mg via ORAL
  Filled 2023-01-14 (×3): qty 1

## 2023-01-14 MED ORDER — RISAQUAD PO CAPS
1.0000 | ORAL_CAPSULE | Freq: Three times a day (TID) | ORAL | Status: DC
  Administered 2023-01-14 – 2023-01-18 (×10): 1 via ORAL
  Filled 2023-01-14 (×10): qty 1

## 2023-01-14 MED ORDER — FUROSEMIDE 40 MG PO TABS: 80.0000 mg | ORAL_TABLET | Freq: Every day | ORAL | Status: DC

## 2023-01-14 MED ORDER — SODIUM CHLORIDE 0.9 % IV SOLN
2.0000 g | Freq: Two times a day (BID) | INTRAVENOUS | Status: DC
  Administered 2023-01-14 – 2023-01-18 (×7): 2 g via INTRAVENOUS
  Filled 2023-01-14 (×9): qty 12.5

## 2023-01-14 MED ORDER — INSULIN ASPART 100 UNIT/ML IJ SOLN
0.0000 [IU] | Freq: Three times a day (TID) | INTRAMUSCULAR | Status: DC
  Administered 2023-01-14 – 2023-01-15 (×2): 4 [IU] via SUBCUTANEOUS
  Administered 2023-01-15: 7 [IU] via SUBCUTANEOUS
  Administered 2023-01-16 – 2023-01-17 (×2): 4 [IU] via SUBCUTANEOUS
  Administered 2023-01-18: 7 [IU] via SUBCUTANEOUS
  Filled 2023-01-14 (×6): qty 1

## 2023-01-14 MED ORDER — TACROLIMUS 0.5 MG PO CAPS
0.5000 mg | ORAL_CAPSULE | Freq: Two times a day (BID) | ORAL | Status: DC
  Administered 2023-01-14 – 2023-01-18 (×7): 0.5 mg via ORAL
  Filled 2023-01-14 (×9): qty 1

## 2023-01-14 NOTE — Progress Notes (Signed)
Subjective:  Patient ID: Dean King, male    DOB: 09-Sep-1945,  MRN: [id number]  Chief Complaint  Patient presents with   Wound Check    "I think it's infected.  It's been bleeding like crazy and it hurts."    77 y.o. male presents with the above complaint.  Patient presents with worsening foot infection of the right lateral foot.  Patient states that he did a little bit too much walking and may have led to an infection he states it hurts like crazy.  He has not been taking any antibiotics he has not seen anyone else prior to seeing me denies any other acute complaints.  Wanted to get it evaluated.  There is redness.  No nausea fever chills vomiting   Review of Systems: Negative except as noted in the HPI. Denies N/V/F/Ch.  Past Medical History:  Diagnosis Date   AICD (automatic cardioverter/defibrillator) present    Diabetes mellitus without complication (HCC)    Hypertension     Current Outpatient Medications:    atorvastatin (LIPITOR) 40 MG tablet, Take 1 tablet (40 mg total) by mouth daily., Disp: 30 tablet, Rfl: 2   Calcium Carbonate-Vit D-Min (CALCIUM 600+D3 PLUS MINERALS) 600-800 MG-UNIT TABS, Take 1 tablet by mouth 3 (three) times daily., Disp: , Rfl:    clopidogrel (PLAVIX) 75 MG tablet, TAKE 1 TABLET BY MOUTH EVERY DAY, Disp: 90 tablet, Rfl: 1   dapagliflozin propanediol (FARXIGA) 10 MG TABS tablet, Take by mouth daily., Disp: , Rfl:    doxycycline (VIBRA-TABS) 100 MG tablet, Take 1 tablet (100 mg total) by mouth 2 (two) times daily., Disp: 60 tablet, Rfl: 0   ELIQUIS 5 MG TABS tablet, Take 1 tablet (5 mg total) by mouth 2 (two) times daily., Disp: 60 tablet, Rfl: 5   furosemide (LASIX) 40 MG tablet, Take 80 mg by mouth daily., Disp: , Rfl:    glipiZIDE (GLUCOTROL) 5 MG tablet, Take 5 mg by mouth 2 (two) times daily before a meal., Disp: , Rfl:    magnesium oxide (MAG-OX) 400 MG tablet, Take 400 mg by mouth 2 (two) times daily., Disp: , Rfl:    metFORMIN (GLUCOPHAGE) 500  MG tablet, Take 1,000 mg by mouth 2 (two) times daily., Disp: , Rfl:    metoprolol succinate (TOPROL-XL) 100 MG 24 hr tablet, Take 150 mg by mouth daily. Take 1 tablet (100mg ) by mouth daily and 0.5 tablet (50mg ) nightly, Disp: , Rfl:    nitroGLYCERIN (NITROSTAT) 0.4 MG SL tablet, Place 0.4 mg under the tongue as needed., Disp: , Rfl:    oxyCODONE-acetaminophen (PERCOCET/ROXICET) 5-325 MG tablet, Take 1-2 tablets by mouth every 6 (six) hours as needed for moderate pain., Disp: 30 tablet, Rfl: 0   PROGRAF 0.5 MG capsule, Take 0.5 mg by mouth 2 (two) times daily., Disp: , Rfl:    Sacubitril-Valsartan (ENTRESTO PO), Take 24-26 mg by mouth 2 (two) times daily. 1/2 tablet, Disp: , Rfl:    spironolactone (ALDACTONE) 25 MG tablet, Take 12.5 mg by mouth daily., Disp: , Rfl:    losartan (COZAAR) 25 MG tablet, Take 1 tablet (25 mg total) by mouth daily. (Patient not taking: Reported on 01/02/2023), Disp: 30 tablet, Rfl: 1  Social History   Tobacco Use  Smoking Status Some Days   Types: Cigars  Smokeless Tobacco Never  Tobacco Comments   Occasional cigar when I play golf. 01/02/23 DJM    Allergies  Allergen Reactions   Levaquin [Levofloxacin]    Objective:  Vitals:   01/14/23 0855  BP: 119/65  Pulse: 79   There is no height or weight on file to calculate BMI. Constitutional Well developed. Well nourished.  Vascular Dorsalis pedis pulses palpable bilaterally. Posterior tibial pulses palpable bilaterally. Capillary refill normal to all digits.  No cyanosis or clubbing noted. Pedal hair growth normal.  Neurologic Normal speech. Oriented to person, place, and time. Epicritic sensation to light touch grossly present bilaterally.  Dermatologic Nails well groomed and normal in appearance. No open wounds. No skin lesions.  Orthopedic: Right lateral foot wound with concern for deeper abscess.  Crepitus noted.  Probing down to deep tissue/bone.  Some malodor present superficial purulent  drainage noted.    Assessment:   1. 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 Abscess of right foot   3. Cellulitis of foot, right    Plan:  Patient was evaluated and treated and all questions answered.  Right foot abscess with concern for underlying osteomyelitis -All questions and concerns were discussed with the patient in extensive detail -Patient will benefit from admission into the hospital for IV antibiotics and an MRI -Patient also has a vascular history therefore patient will benefit from ABIs to assess the vascular flow -Betadine wet-to-dry dressing -Plan for surgery Thursday for right foot incision and drainage with possible partial fifth ray amputation with me -Partial weightbearing to the heel in surgical shoe -Patient is a high risk of undergoing amputation versus major amputation.  I discussed with patient he states understanding  No follow-ups on file.

## 2023-01-14 NOTE — ED Triage Notes (Signed)
Pt to ED from podiatrist for IV antibiotics. Infection to right foot since march. +DM

## 2023-01-14 NOTE — Progress Notes (Signed)
PHARMACY - ANTICOAGULATION CONSULT NOTE  Pharmacy Consult for IV heparin Indication: atrial fibrillation  Allergies  Allergen Reactions   Levaquin [Levofloxacin]     Patient Measurements: Height: 5\' 10"  (177.8 cm) Weight: 74.4 kg (164 lb) IBW/kg (Calculated) : 73 Heparin Dosing Weight: 74.4 kg  Vital Signs: Temp: 97.9 F (36.6 C) (10/22 0946) BP: 101/55 (10/22 1348) Pulse Rate: 75 (10/22 1348)  Labs: Recent Labs    01/14/23 0948  HGB 11.6*  HCT 36.0*  PLT 289  CREATININE 1.73*    Estimated Creatinine Clearance: 36.9 mL/min (A) (by C-G formula based on SCr of 1.73 mg/dL (H)).   Medical History: Past Medical History:  Diagnosis Date   AICD (automatic cardioverter/defibrillator) present    Diabetes mellitus without complication (HCC)    Hypertension     Medications:  Eliquis last dose 10/22 AM  Assessment: 77 year old male admitted with diabetic ulcer infection. PMH includes Afib on Eliquis. Last dose of Eliquis 10/22 AM.   Goal of Therapy:  aPTT 66-102 seconds Monitor platelets by anticoagulation protocol: Yes   Plan:  Start heparin infusion at 1000 units/hr @2000 . Monitor via aPTT until correlation with heparin levels. Next aPTT 8 hours from start of infusion Continue to monitor H&H and platelets   Elliot Gurney, PharmD, BCPS Clinical Pharmacist  01/14/2023 3:42 PM

## 2023-01-14 NOTE — Progress Notes (Signed)
PHARMACY -  BRIEF ANTIBIOTIC NOTE   Pharmacy has received consult(s) for vancomycin from an ED provider.  The patient's profile has been reviewed for ht/wt/allergies/indication/available labs.    One time order(s) placed for vancomycin 1,000 mg x 1  Further antibiotics/pharmacy consults should be ordered by admitting physician if indicated.                       Thank you,  Elliot Gurney, PharmD, BCPS Clinical Pharmacist  01/14/2023 11:32 AM

## 2023-01-14 NOTE — H&P (Signed)
History and Physical    [name]:[id number] DOB: [birth year] DOA: 01/14/2023  PCP: Dean Shaggy, MD (Confirm with patient/family/NH records and if not entered, this has to be entered at [facility] point of entry) Patient coming from: Home  I have personally briefly reviewed patient's old medical records in [facility]  Chief Complaint: Right foot wound  HPI: Dean King is a 77 y.o. 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  Patient has history of chronic right foot ulcer been taking care of podiatry since March of this year.  Treatment including outpatient wound care center debridement and 2 rounds of p.o. cyclin, the most recent round of p.o. antibiotics completed about 3 to 4 weeks ago.  After that, patient seemed stable dry right foot ulcer.  Last Friday however patient started to develop severe pain associated with the right foot ulcer, last 2 days even putting weight on right foot and gravity triggered significant pain.  Denies any fever chills, no claudication.  Wife does report that the wound started to drain again with thick yellowish pus for the last 3 days.  Today, patient went to see podiatry who sent patient to ED.  ED Course: Afebrile, nontachycardic blood pressure 101/55.  WBC 9.6, hemoglobin 12.8, hemoglobin 11.6, creatinine 1.7 compared to baseline 1.1.  Patient was started on vancomycin and ceftriaxone in the ED.  Review of Systems: As per HPI otherwise 14 point review of systems negative.    Past Medical History:  Diagnosis Date   AICD (automatic cardioverter/defibrillator) present    Diabetes mellitus without complication (HCC)    Hypertension     Past Surgical History:  Procedure Laterality Date   ABLATION  2023   CARDIAC CATHETERIZATION     CAROTID PTA/STENT INTERVENTION Left 04/05/2021   Procedure: CAROTID PTA/STENT INTERVENTION;  Surgeon:  Annice Needy, MD;  Location: [facility] INVASIVE CV LAB;  Service: Cardiovascular;  Laterality: Left;   CHOLECYSTECTOMY     CORONARY ANGIOPLASTY     liver transplannt     LOWER EXTREMITY ANGIOGRAPHY Right 06/20/2022   Procedure: Lower Extremity Angiography;  Surgeon: Annice Needy, MD;  Location: [facility] INVASIVE CV LAB;  Service: Cardiovascular;  Laterality: Right;   LOWER EXTREMITY ANGIOGRAPHY Right 07/25/2022   Procedure: Lower Extremity Angiography;  Surgeon: Annice Needy, MD;  Location: [facility] INVASIVE CV LAB;  Service: Cardiovascular;  Laterality: Right;   LOWER EXTREMITY INTERVENTION Right 07/26/2022   Procedure: LOWER EXTREMITY INTERVENTION;  Surgeon: Annice Needy, MD;  Location: [facility] INVASIVE CV LAB;  Service: Cardiovascular;  Laterality: Right;     reports that he has been smoking cigars. He has never used smokeless tobacco. He reports that he does not currently use alcohol. He reports that he does not use drugs.  Allergies  Allergen Reactions   Levaquin [Levofloxacin]     Family History  Family history unknown: Yes     Prior to Admission medications   Medication Sig Start Date End Date Taking? Authorizing Provider  ENTRESTO 24-26 MG Take 1 tablet by mouth 2 (two) times daily. 12/10/22  Yes [provider]  atorvastatin (LIPITOR) 40 MG tablet Take 1 tablet (40 mg total) by mouth daily. 03/25/21   Azucena Fallen, MD  Calcium Carbonate-Vit D-Min (CALCIUM 600+D3 PLUS MINERALS) 600-800 MG-UNIT TABS Take 1 tablet by mouth 3 (three) times daily.    [provider]  clopidogrel (PLAVIX) 75 MG tablet TAKE 1 TABLET BY MOUTH EVERY DAY 09/18/22   Annice Needy, MD  dapagliflozin propanediol (FARXIGA) 10 MG TABS tablet Take by mouth daily.    [provider]  doxycycline (VIBRA-TABS) 100 MG tablet Take 1 tablet (100 mg total) by mouth 2 (two) times daily. 10/31/22   Candelaria Stagers, DPM  ELIQUIS 5 MG TABS tablet Take 1 tablet (5 mg total) by mouth 2 (two) times daily. 06/27/22    Annice Needy, MD  furosemide (LASIX) 40 MG tablet Take 80 mg by mouth daily.    [provider]  glipiZIDE (GLUCOTROL) 5 MG tablet Take 5 mg by mouth 2 (two) times daily before a meal.    [provider]  losartan (COZAAR) 25 MG tablet Take 1 tablet (25 mg total) by mouth daily. Patient not taking: Reported on 01/02/2023 03/24/21   Azucena Fallen, MD  magnesium oxide (MAG-OX) 400 MG tablet Take 400 mg by mouth 2 (two) times daily.    [provider]  metFORMIN (GLUCOPHAGE) 500 MG tablet Take 1,000 mg by mouth 2 (two) times daily.    [provider]  metoprolol succinate (TOPROL-XL) 100 MG 24 hr tablet Take 150 mg by mouth daily. Take 1 tablet (100mg ) by mouth daily and 0.5 tablet (50mg ) nightly    [provider]  nitroGLYCERIN (NITROSTAT) 0.4 MG SL tablet Place 0.4 mg under the tongue as needed. 04/25/21   [provider]  oxyCODONE-acetaminophen (PERCOCET/ROXICET) 5-325 MG tablet Take 1-2 tablets by mouth every 6 (six) hours as needed for moderate pain. 07/26/22   Pace, Peggye Ley, NP  PROGRAF 0.5 MG capsule Take 0.5 mg by mouth 2 (two) times daily.    [provider]  Sacubitril-Valsartan (ENTRESTO PO) Take 24-26 mg by mouth 2 (two) times daily. 1/2 tablet    [provider]  spironolactone (ALDACTONE) 25 MG tablet Take 12.5 mg by mouth daily. 08/02/21   [provider]    Physical Exam: Vitals:   01/14/23 0946 01/14/23 1155 01/14/23 1348  BP: 117/62 (!) 97/55 (!) 101/55  Pulse: 78 73 75  Resp: 18 16 17   Temp: 97.9 F (36.6 C)    SpO2: 100% 98% 97%  Weight: 74.4 kg    Height: 5\' 10"  (1.778 m)      Constitutional: NAD, calm, comfortable Vitals:   01/14/23 0946 01/14/23 1155 01/14/23 1348  BP: 117/62 (!) 97/55 (!) 101/55  Pulse: 78 73 75  Resp: 18 16 17   Temp: 97.9 F (36.6 C)    SpO2: 100% 98% 97%  Weight: 74.4 kg    Height: 5\' 10"  (1.778 m)     Eyes: PERRL, lids and conjunctivae normal ENMT:  Mucous membranes are moist. Posterior pharynx clear of any exudate or lesions.Normal dentition.  Neck: normal, supple, no masses, no thyromegaly Respiratory: clear to auscultation bilaterally, no wheezing, no crackles. Normal respiratory effort. No accessory muscle use.  Cardiovascular: Regular rate and rhythm, no murmurs / rubs / gallops. No extremity edema. 2+ pedal pulses. No carotid bruits.  Abdomen: no tenderness, no masses palpated. No hepatosplenomegaly. Bowel sounds positive.  Musculoskeletal: no clubbing / cyanosis. No joint deformity upper and lower extremities. Good ROM, no contractures. Normal muscle tone.  Skin: Large ulcer on right lateral distal part of the foot, with purulent thick yellowish discharge Neurologic: CN 2-12 grossly intact. Sensation intact, DTR normal. Strength 5/5 in all 4.  Psychiatric: Normal judgment and insight. Alert and oriented x  3. Normal mood.     Labs on Admission: I have personally reviewed following labs and imaging studies  CBC: Recent Labs  Lab 01/14/23 0948  WBC 12.8*  HGB 11.6*  HCT 36.0*  MCV 81.1  PLT 289   Basic Metabolic Panel: Recent Labs  Lab 01/14/23 0948  NA 134*  K 4.1  CL 98  CO2 23  GLUCOSE 211*  BUN 36*  CREATININE 1.73*  CALCIUM 9.5   GFR: Estimated Creatinine Clearance: 36.9 mL/min (A) (by C-G formula based on SCr of 1.73 mg/dL (H)). Liver Function Tests: No results for input(s): "AST", "ALT", "ALKPHOS", "BILITOT", "PROT", "ALBUMIN" in the last 168 hours. No results for input(s): "LIPASE", "AMYLASE" in the last 168 hours. No results for input(s): "AMMONIA" in the last 168 hours. Coagulation Profile: No results for input(s): "INR", "PROTIME" in the last 168 hours. Cardiac Enzymes: No results for input(s): "CKTOTAL", "CKMB", "CKMBINDEX", "TROPONINI" in the last 168 hours. BNP (last 3 results) No results for input(s): "PROBNP" in the last 8760 hours. HbA1C: No results for input(s): "HGBA1C" in the last 72  hours. CBG: No results for input(s): "GLUCAP" in the last 168 hours. Lipid Profile: No results for input(s): "CHOL", "HDL", "LDLCALC", "TRIG", "CHOLHDL", "LDLDIRECT" in the last 72 hours. Thyroid Function Tests: No results for input(s): "TSH", "T4TOTAL", "FREET4", "T3FREE", "THYROIDAB" in the last 72 hours. Anemia Panel: No results for input(s): "VITAMINB12", "FOLATE", "FERRITIN", "TIBC", "IRON", "RETICCTPCT" in the last 72 hours. Urine analysis: No results found for: "COLORURINE", "APPEARANCEUR", "LABSPEC", "PHURINE", "GLUCOSEU", "HGBUR", "BILIRUBINUR", "KETONESUR", "PROTEINUR", "UROBILINOGEN", "NITRITE", "LEUKOCYTESUR"  Radiological Exams on Admission: No results found.  EKG: INone  Assessment/Plan Principal Problem:   Osteomyelitis (HCC)  (please populate well all problems here in Problem List. (For example, if patient is on BP meds at home and you resume or decide to hold them, it is a problem that needs to be her. Same for CAD, COPD, HLD and so on)  Right foot diabetic ulcer infection -Rule out osteomyelitis -As per instruction by patient's podiatry Dr. Allena Katz, ED initiated IV antibiotics.  Plan to continue vancomycin and change ceftriaxone to cefepime to cover possible Pseudomonas infection. -[facility] MRI cannot accommodate MRI study due to AICD.  Arrange patient to go to [facility] to have MRI tomorrow morning at 9 AM.  Left a miscellaneous order to floor nurse to contact [facility] tomorrow morning to arrange transportation for and back to [facility]  Patient updated. -Check ABI  AKI -Appears to be euvolemic -Check FeNa study -Hold off Entresto and spironolactone and Lasix today -Check tacrolimus level  Chronic HFrEF -Euvolemic, hold off CHF medication due to AKI today.  PAF -Change Eliquis to heparin drip for incoming podiatry intervention.  PVD -Denied any claudication -Had stenting x 2 in May on the right leg this year -ABI's were ordered -Continue Plavix and  statin  IIDM -Given there is a kidney function surge, will hold off Amaryl and Farxiga -Start SSI  Liver transplant -Check Prograf level -Continue Prograf  DVT prophylaxis: Heparin drip Code Status: Full code Family Communication: Wife at bedside Disposition Plan: Patient is sick with significant right foot ulcer infection possible osteomyelitis requiring inpatient podiatry evaluation and IV antibiotics, expect more than 2 midnight hospital stay Consults called: Podiatry Admission status: MedSurg admission   Emeline General MD [facility] Pager [phone]  01/14/2023, 2:13 PM

## 2023-01-14 NOTE — ED Provider Notes (Signed)
[facility] Provider Note    Event Date/Time   First MD Initiated Contact with Patient 01/14/23 1047     (approximate)   History   Wound Infection   HPI  Dean King is a 77 y.o. male with history of heart failure who comes in with right foot ulcer.  Patient reports being off on antibiotics for this previously.  He is not currently on any antibiotics.  He reports having increasing pain on the right lateral foot and saw podiatry today.  There is a picture in the chart.  He was concerned about a right foot abscess with underlying osteomyelitis so recommend admission for IV antibiotics, MRI, vascular consultation.  I reviewed podiatry's note from today  Physical Exam   Triage Vital Signs: ED Triage Vitals [01/14/23 0946]  Encounter Vitals Group     BP 117/62     Systolic BP Percentile      Diastolic BP Percentile      Pulse Rate 78     Resp 18     Temp 97.9 F (36.6 C)     Temp src      SpO2 100 %     Weight 164 lb (74.4 kg)     Height 5\' 10"  (1.778 m)     Head Circumference      Peak Flow      Pain Score 7     Pain Loc      Pain Education      Exclude from Growth Chart     Most recent vital signs: Vitals:   01/14/23 0946  BP: 117/62  Pulse: 78  Resp: 18  Temp: 97.9 F (36.6 C)  SpO2: 100%     General: Awake, no distress.  CV:  Good peripheral perfusion.  Resp:  Normal effort.  Abd:  No distention.  Other:  Patient has distal pulse noted but does have ulcer noted on the right lateral foot.   ED Results / Procedures / Treatments   Labs (all labs ordered are listed, but only abnormal results are displayed) Labs Reviewed  CBC - Abnormal; Notable for the following components:      Result Value   WBC 12.8 (*)    Hemoglobin 11.6 (*)    HCT 36.0 (*)    RDW 20.7 (*)    All other components within normal limits  BASIC METABOLIC PANEL - Abnormal; Notable for the following components:   Sodium 134 (*)    Glucose, Bld 211 (*)     BUN 36 (*)    Creatinine, Ser 1.73 (*)    GFR, Estimated 40 (*)    All other components within normal limits    PROCEDURES:  Critical Care performed: No  Procedures   MEDICATIONS ORDERED IN ED: Medications  oxyCODONE (Oxy IR/ROXICODONE) immediate release tablet 5 mg (has no administration in time range)  vancomycin (VANCOCIN) IVPB 1000 mg/200 mL premix (has no administration in time range)  cefTRIAXone (ROCEPHIN) 2 g in sodium chloride 0.9 % 100 mL IVPB (has no administration in time range)     IMPRESSION / MDM / ASSESSMENT AND PLAN / ED COURSE  I reviewed the triage vital signs and the nursing notes.   Patient's presentation is most consistent with acute presentation with potential threat to life or bodily function.   Differential is osteomyelitis, cellulitis, abscess.  Will start on IV antibiotics and discussed with hospitalist for admission.  BMP shows slightly elevated creatinine, CBC shows elevated white count  however patient does not meet sepsis criteria therefore blood cultures, lactate were not ordered but patient was started on Vanco and ceftriaxone.        FINAL CLINICAL IMPRESSION(S) / ED DIAGNOSES   Final diagnoses:  Cellulitis of right lower extremity  Ulcer of right foot, unspecified ulcer stage (HCC)     Rx / DC Orders   ED Discharge Orders     None        Note:  This document was prepared using Dragon voice recognition software and may include unintentional dictation errors.   Concha Se, MD 01/14/23 [phone]

## 2023-01-14 NOTE — Progress Notes (Signed)
CODE SEPSIS - PHARMACY COMMUNICATION  **Broad Spectrum Antibiotics should be administered within 1 hour of Sepsis diagnosis**  Time Code Sepsis Called/Page Received: 1112  Antibiotics Ordered: cefepime and vancomycin  Time of 1st antibiotic administration: 1125  Additional action taken by pharmacy: none  If necessary, Name of Provider/Nurse Contacted: n/a    Elliot Gurney, PharmD, BCPS Clinical Pharmacist  01/14/2023 11:33 AM

## 2023-01-14 NOTE — Progress Notes (Signed)
Pharmacy Antibiotic Note  Dean King is a 77 y.o. male admitted on 01/14/2023 with  wound infection .  Pharmacy has been consulted for vancomycin and cefepime dosing. Renal function elevated from baseline significantly  Plan: Cefepime 2 grams every 12 hours  Vancomycin 750 mg to fulfill 1750 mg loading dose. Follow up renal function in AM for future doses.   Height: 5\' 10"  (177.8 cm) Weight: 74.4 kg (164 lb) IBW/kg (Calculated) : 73  Temp (24hrs), Avg:97.9 F (36.6 C), Min:97.9 F (36.6 C), Max:97.9 F (36.6 C)  Recent Labs  Lab 01/14/23 0948  WBC 12.8*  CREATININE 1.73*    Estimated Creatinine Clearance: 36.9 mL/min (A) (by C-G formula based on SCr of 1.73 mg/dL (H)).    Allergies  Allergen Reactions   Levaquin [Levofloxacin]     Antimicrobials this admission: cefepime 10/22 >>  vancomycin 10/22 >>   Dose adjustments this admission: N/a  Microbiology results: N/a  Thank you for allowing pharmacy to be a part of this patient's care.  Jaynie Bream 01/14/2023 2:09 PM

## 2023-01-15 ENCOUNTER — Encounter (INDEPENDENT_AMBULATORY_CARE_PROVIDER_SITE_OTHER): Payer: Medicare Other

## 2023-01-15 ENCOUNTER — Ambulatory Visit (INDEPENDENT_AMBULATORY_CARE_PROVIDER_SITE_OTHER): Payer: Medicare Other | Admitting: Nurse Practitioner

## 2023-01-15 ENCOUNTER — Inpatient Hospital Stay: Payer: Medicare Other

## 2023-01-15 ENCOUNTER — Ambulatory Visit (HOSPITAL_COMMUNITY)
Admit: 2023-01-15 | Discharge: 2023-01-15 | Disposition: A | Discharge status: Home / Self Care | Payer: Medicare Other | Attending: Internal Medicine | Admitting: Internal Medicine

## 2023-01-15 DIAGNOSIS — L97519 Non-pressure chronic ulcer of other part of right foot with unspecified severity: Secondary | ICD-10-CM | POA: Insufficient documentation

## 2023-01-15 DIAGNOSIS — L03115 Cellulitis of right lower limb: Secondary | ICD-10-CM | POA: Diagnosis not present

## 2023-01-15 DIAGNOSIS — M009 Pyogenic arthritis, unspecified: Secondary | ICD-10-CM | POA: Insufficient documentation

## 2023-01-15 DIAGNOSIS — I70211 Atherosclerosis of native arteries of extremities with intermittent claudication, right leg: Secondary | ICD-10-CM

## 2023-01-15 DIAGNOSIS — M86471 Chronic osteomyelitis with draining sinus, right ankle and foot: Secondary | ICD-10-CM | POA: Diagnosis not present

## 2023-01-15 LAB — CBC
HCT: 30.4 % — ABNORMAL LOW (ref 39.0–52.0)
Hemoglobin: 10 g/dL — ABNORMAL LOW (ref 13.0–17.0)
MCH: 26.2 pg (ref 26.0–34.0)
MCHC: 32.9 g/dL (ref 30.0–36.0)
MCV: 79.6 fL — ABNORMAL LOW (ref 80.0–100.0)
Platelets: 255 10*3/uL (ref 150–400)
RBC: 3.82 MIL/uL — ABNORMAL LOW (ref 4.22–5.81)
RDW: 20.3 % — ABNORMAL HIGH (ref 11.5–15.5)
WBC: 9.8 10*3/uL (ref 4.0–10.5)
nRBC: 0 % (ref 0.0–0.2)

## 2023-01-15 LAB — APTT
aPTT: 55 s — ABNORMAL HIGH (ref 24–36)
aPTT: 104 s — ABNORMAL HIGH (ref 24–36)

## 2023-01-15 LAB — BASIC METABOLIC PANEL
Anion gap: 10 (ref 5–15)
BUN: 35 mg/dL — ABNORMAL HIGH (ref 8–23)
CO2: 25 mmol/L (ref 22–32)
Calcium: 8.7 mg/dL — ABNORMAL LOW (ref 8.9–10.3)
Chloride: 99 mmol/L (ref 98–111)
Creatinine, Ser: 1.47 mg/dL — ABNORMAL HIGH (ref 0.61–1.24)
GFR, Estimated: 49 mL/min — ABNORMAL LOW (ref >60)
Glucose, Bld: 138 mg/dL — ABNORMAL HIGH (ref 70–99)
Potassium: 3.6 mmol/L (ref 3.5–5.1)
Sodium: 134 mmol/L — ABNORMAL LOW (ref 135–145)

## 2023-01-15 LAB — GLUCOSE, CAPILLARY
Glucose-Capillary: 180 mg/dL — ABNORMAL HIGH (ref 70–99)
Glucose-Capillary: 204 mg/dL — ABNORMAL HIGH (ref 70–99)
Glucose-Capillary: 112 mg/dL — ABNORMAL HIGH (ref 70–99)
Glucose-Capillary: 217 mg/dL — ABNORMAL HIGH (ref 70–99)

## 2023-01-15 LAB — HEPARIN LEVEL (UNFRACTIONATED): Heparin Unfractionated: >1.1 [IU]/mL — ABNORMAL HIGH (ref 0.30–0.70)

## 2023-01-15 MED ORDER — VANCOMYCIN HCL IN DEXTROSE 1-5 GM/200ML-% IV SOLN
1000.0000 mg | INTRAVENOUS | Status: DC
  Administered 2023-01-15 – 2023-01-17 (×3): 1000 mg via INTRAVENOUS
  Filled 2023-01-15 (×4): qty 200

## 2023-01-15 MED ORDER — METOPROLOL SUCCINATE ER 50 MG PO TB24
50.0000 mg | ORAL_TABLET | Freq: Every day | ORAL | Status: DC
  Administered 2023-01-15 – 2023-01-17 (×3): 50 mg via ORAL
  Filled 2023-01-15 (×3): qty 1

## 2023-01-15 MED ORDER — HEPARIN BOLUS VIA INFUSION
2200.0000 [IU] | Freq: Once | INTRAVENOUS | Status: AC
  Administered 2023-01-15: 2200 [IU] via INTRAVENOUS
  Filled 2023-01-15: qty 2200

## 2023-01-15 MED ORDER — METOPROLOL SUCCINATE ER 100 MG PO TB24
100.0000 mg | ORAL_TABLET | Freq: Every day | ORAL | Status: DC
  Administered 2023-01-15 – 2023-01-18 (×3): 100 mg via ORAL
  Filled 2023-01-15 (×3): qty 1

## 2023-01-15 NOTE — TOC CM/SW Note (Signed)
Patient admitted from home with osteo.  Patient currently at [facility] for MRI.

## 2023-01-15 NOTE — Progress Notes (Signed)
Progress Note   Patient: Dean King QIH:[id number] DOB: 06-12-1945 DOA: 01/14/2023     1 DOS: the patient was seen and examined on 01/15/2023   Brief hospital course:  SOMNANG YELLEN is a 77 y.o. 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   Patient has history of chronic right foot ulcer been taking care of podiatry since March of this year.  Treatment including outpatient wound care center debridement and 2 rounds of p.o. cyclin, the most recent round of p.o. antibiotics completed about 3 to 4 weeks ago.  After that, patient seemed stable dry right foot ulcer.  Last Friday however patient started to develop severe pain associated with the right foot ulcer, last 2 days even putting weight on right foot and gravity triggered significant pain.  Denies any fever chills, no claudication.  Wife does report that the wound started to drain again with thick yellowish pus for the last 3 days.  Today, patient went to see podiatry who sent patient to ED.   ED Course: Afebrile, nontachycardic blood pressure 101/55.  WBC 9.6, hemoglobin 12.8, hemoglobin 11.6, creatinine 1.7 compared to baseline 1.1.   Patient was started on vancomycin and ceftriaxone in the ED.     Assessment and Plan:  Osteomyelitis (HCC) Right foot diabetic ulcer infection Patient presents to the ER for evaluation of severe pain involving a chronic right foot ulcer, difficulty bearing weight on his right foot and purulent drainage MRI of the right foot showed lateral forefoot ulceration adjacent to the fifth MTP joint extending to bone. 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 Fifth MTP joint septic arthritis. Small sub centimeter abscess superior to the fifth MTP joint. Continue vancomycin and cefepime to cover possible Pseudomonas infection.    AKI Baseline serum creatinine of 1.1.  1.73 on  admission and down to 1.47 Patient's Entresto, spironolactone and Lasix were placed on hold for 24 hours Monitor renal function closely especially while on vancomycin    Chronic HFrEF Euvolemic Continue spironolactone, Entresto and furosemide    PAF Currently on a heparin drip pending podiatry intervention Continue to hold Eliquis    PVD Denied any claudication Had stenting x 2 in May on the right leg this year ABI's were ordered Continue Plavix and statin   Type 2 diabetes mellitus Hold Amaryl and Farxiga Maintain consistent carbohydrate diet Continue SSI   Liver transplant -Continue Prograf      Subjective: Patient is seen and examined at the bedside.  Scheduled for transfer to [facility] for an MRI of the right foot.  Physical Exam: Vitals:   01/14/23 2130 01/14/23 2208 01/15/23 0902 01/15/23 1239  BP: (!) 106/57 136/66 96/62 116/75  Pulse: 82 93 89 88  Resp: 18 18  18   Temp: 98.5 F (36.9 C) 97.6 F (36.4 C) 98 F (36.7 C) 98.1 F (36.7 C)  TempSrc: Oral Oral Oral Oral  SpO2: 100% 100% 98% 100%  Weight:      Height:       Eyes: PERRL, lids and conjunctivae normal ENMT: Mucous membranes are moist. Posterior pharynx clear of any exudate or lesions.Normal dentition.  Neck: normal, supple, no masses, no thyromegaly Respiratory: clear to auscultation bilaterally, no wheezing, no crackles. Normal respiratory effort. No accessory muscle use.  Cardiovascular: Regular rate and rhythm, no murmurs / rubs / gallops. No extremity edema. 2+ pedal pulses.  No carotid bruits.  Abdomen: no tenderness, no masses palpated. No hepatosplenomegaly. Bowel sounds positive.  Musculoskeletal: no clubbing / cyanosis. No joint deformity upper and lower extremities. Good ROM, no contractures. Normal muscle tone.  Skin: Large ulcer on right lateral distal part of the foot, with purulent thick yellowish discharge Neurologic: CN 2-12 grossly intact. Sensation intact, DTR normal.  Strength 5/5 in all 4.  Psychiatric: Normal judgment and insight. Alert and oriented x 3. Normal mood.      Data Reviewed: Labs reviewed.  Creatinine 1.47, BUN 35 There are no new results to review at this time.  Family Communication: Plan of care was discussed with patient in detail.  He verbalizes understanding and agrees to the plan.  Disposition: Status is: Inpatient Remains inpatient appropriate because: Needs I&D of right fifth toe abscess  Planned Discharge Destination: Home    Time spent: 35 minutes  Author: Lucile Shutters, MD 01/15/2023 2:03 PM  For on call review [email or web address].

## 2023-01-15 NOTE — Progress Notes (Addendum)
MD order to contact [facility] to schedule transferring patient to [facility] hospital MRI, which is scheduled at 9 AM. Patient will come back to [facility] after the MRI. [facility] contacted and transfer scheduled to take Pt to MRI at [facility] for 9 AM.

## 2023-01-15 NOTE — Progress Notes (Signed)
PHARMACY - ANTICOAGULATION CONSULT NOTE  Pharmacy Consult for IV heparin Indication: atrial fibrillation  Allergies  Allergen Reactions   Levaquin [Levofloxacin] Anaphylaxis    Patient Measurements: Height: 5\' 10"  (177.8 cm) Weight: 74.4 kg (164 lb) IBW/kg (Calculated) : 73 Heparin Dosing Weight: 74.4 kg  Vital Signs: Temp: 97.6 F (36.4 C) (10/22 2208) Temp Source: Oral (10/22 2208) BP: 136/66 (10/22 2208) Pulse Rate: 93 (10/22 2208)  Labs: Recent Labs    01/14/23 0948 01/14/23 1422 01/15/23 0435  HGB 11.6*  --  10.0*  HCT 36.0*  --  30.4*  PLT 289  --  255  APTT  --  28 55*  LABPROT  --  19.3*  --   INR  --  1.6*  --   HEPARINUNFRC  --  >1.10* >1.10*  CREATININE 1.73*  --  1.47*    Estimated Creatinine Clearance: 43.5 mL/min (A) (by C-G formula based on SCr of 1.47 mg/dL (H)).   Medical History: Past Medical History:  Diagnosis Date   AICD (automatic cardioverter/defibrillator) present    Diabetes mellitus without complication (HCC)    Hypertension    Myocardial infarction [facility])    x2 2005, 2006    Medications:  Eliquis last dose 10/22 AM  Assessment: 77 year old male admitted with diabetic ulcer infection. PMH includes Afib on Eliquis. Last dose of Eliquis 10/22 AM.   Goal of Therapy:  aPTT 66-102 seconds Monitor platelets by anticoagulation protocol: Yes   Plan:  10/23 @ 0435:  aPTT = 55,  HL = > 1.10 - aPTT is SUBtherapeutic,  HL still elevated from PTA Eliquis - Will order heparin 2200 units IV X 1 bolus and increase drip rate to 1250 units/hr - Will recheck aPTT 8 hrs after rate change - Will recheck HL on 10/24 with AM labs Continue to monitor H&H and platelets   Marquavius Scaife D Clinical Pharmacist  01/15/2023 5:43 AM

## 2023-01-15 NOTE — Consult Note (Signed)
Reason for Consult: Right foot infection Referring Physician: Dr Kendal Hymen is an 77 y.o. male.  HPI: Patient is well-known to myself my partner Dr. Allena Katz for ongoing right foot ulcerations and wound care, the wound acutely worsened over the weekend and it was recommended he proceed to the ER.  He completed an MRI today and on my exam he just returned from the vascular lab.  Past Medical History:  Diagnosis Date   AICD (automatic cardioverter/defibrillator) present    Diabetes mellitus without complication (HCC)    Hypertension    Myocardial infarction [facility])    x2 2005, 2006    Past Surgical History:  Procedure Laterality Date   ABLATION  2023   CARDIAC CATHETERIZATION     CAROTID PTA/STENT INTERVENTION Left 04/05/2021   Procedure: CAROTID PTA/STENT INTERVENTION;  Surgeon: Annice Needy, MD;  Location: [facility] INVASIVE CV LAB;  Service: Cardiovascular;  Laterality: Left;   CHOLECYSTECTOMY     CORONARY ANGIOPLASTY     liver transplannt     LOWER EXTREMITY ANGIOGRAPHY Right 06/20/2022   Procedure: Lower Extremity Angiography;  Surgeon: Annice Needy, MD;  Location: [facility] INVASIVE CV LAB;  Service: Cardiovascular;  Laterality: Right;   LOWER EXTREMITY ANGIOGRAPHY Right 07/25/2022   Procedure: Lower Extremity Angiography;  Surgeon: Annice Needy, MD;  Location: [facility] INVASIVE CV LAB;  Service: Cardiovascular;  Laterality: Right;   LOWER EXTREMITY INTERVENTION Right 07/26/2022   Procedure: LOWER EXTREMITY INTERVENTION;  Surgeon: Annice Needy, MD;  Location: [facility] INVASIVE CV LAB;  Service: Cardiovascular;  Laterality: Right;    Family History  Family history unknown: Yes    Social History:  reports that he has been smoking cigars. He has never used smokeless tobacco. He reports that he does not currently use alcohol. He reports that he does not use drugs.  Allergies:  Allergies  Allergen Reactions   Levaquin [Levofloxacin] Anaphylaxis    Medications: I have reviewed the  patient's current medications.  Results for orders placed or performed during the hospital encounter of 01/14/23 (from the past 48 hour(s))  CBC     Status: Abnormal   Collection Time: 01/14/23  9:48 AM  Result Value Ref Range   WBC 12.8 (H) 4.0 - 10.5 K/uL   RBC 4.44 4.22 - 5.81 MIL/uL   Hemoglobin 11.6 (L) 13.0 - 17.0 g/dL   HCT 08.6 (L) 57.8 - 46.9 %   MCV 81.1 80.0 - 100.0 fL   MCH 26.1 26.0 - 34.0 pg   MCHC 32.2 30.0 - 36.0 g/dL   RDW 62.9 (H) 52.8 - 41.3 %   Platelets 289 150 - 400 K/uL   nRBC 0.0 0.0 - 0.2 %    Comment: Performed at [facility], [address] [place], [place] [address]  Basic metabolic panel     Status: Abnormal   Collection Time: 01/14/23  9:48 AM  Result Value Ref Range   Sodium 134 (L) 135 - 145 mmol/L   Potassium 4.1 3.5 - 5.1 mmol/L   Chloride 98 98 - 111 mmol/L   CO2 23 22 - 32 mmol/L   Glucose, Bld 211 (H) 70 - 99 mg/dL    Comment: Glucose reference range applies only to samples taken after fasting for at least 8 hours.   BUN 36 (H) 8 - 23 mg/dL   Creatinine, Ser 0.27 (H) 0.61 - 1.24 mg/dL   Calcium 9.5 8.9 - 25.3 mg/dL   GFR, Estimated 40 (L) >60 mL/min  Comment: (NOTE) Calculated using the CKD-EPI Creatinine Equation (2021)    Anion gap 13 5 - 15    Comment: Performed at [facility], [address] [place], [place] [address]  Hemoglobin A1c     Status: Abnormal   Collection Time: 01/14/23  9:48 AM  Result Value Ref Range   Hgb A1c MFr Bld 7.8 (H) 4.8 - 5.6 %    Comment: (NOTE) Pre diabetes:          5.7%-6.4%  Diabetes:              >6.4%  Glycemic control for   <7.0% adults with diabetes    Mean Plasma Glucose 177.16 mg/dL    Comment: Performed at [facility], [address], [place], [place] [address]  Heparin level (unfractionated)     Status: Abnormal   Collection Time: 01/14/23  2:22 PM  Result Value Ref Range   Heparin Unfractionated >1.10 (H) 0.30 - 0.70 IU/mL    Comment: (NOTE) The  clinical reportable range upper limit is being lowered to >1.10 to align with the FDA approved guidance for the current laboratory assay.  If heparin results are below expected values, and patient dosage has  been confirmed, suggest follow up testing of antithrombin III levels. Performed at [facility], [address] [place], [place] [address]   APTT     Status: None   Collection Time: 01/14/23  2:22 PM  Result Value Ref Range   aPTT 28 24 - 36 seconds    Comment: Performed at [facility], [address] [place], [place] [address]  Protime-INR     Status: Abnormal   Collection Time: 01/14/23  2:22 PM  Result Value Ref Range   Prothrombin Time 19.3 (H) 11.4 - 15.2 seconds   INR 1.6 (H) 0.8 - 1.2    Comment: (NOTE) INR goal varies based on device and disease states. Performed at [facility], [address] [place], [place] [address]   Sodium, urine, random     Status: None   Collection Time: 01/14/23  2:22 PM  Result Value Ref Range   Sodium, Ur 68 mmol/L    Comment: Performed at [facility], [address] [place], [place] [address]  Creatinine, urine, random     Status: None   Collection Time: 01/14/23  2:22 PM  Result Value Ref Range   Creatinine, Urine 41 mg/dL    Comment: Performed at [facility], [address] [place], [place] [address]  CBG monitoring, ED     Status: Abnormal   Collection Time: 01/14/23  5:14 PM  Result Value Ref Range   Glucose-Capillary 160 (H) 70 - 99 mg/dL    Comment: Glucose reference range applies only to samples taken after fasting for at least 8 hours.  CBG monitoring, ED     Status: Abnormal   Collection Time: 01/14/23  9:38 PM  Result Value Ref Range   Glucose-Capillary 119 (H) 70 - 99 mg/dL    Comment: Glucose reference range applies only to samples taken after fasting for at least 8 hours.  Glucose, capillary     Status: Abnormal   Collection Time: 01/14/23 10:12 PM  Result  Value Ref Range   Glucose-Capillary 102 (H) 70 - 99 mg/dL    Comment: Glucose reference range applies only to samples taken after fasting for at least 8 hours.  CBC     Status: Abnormal   Collection Time: 01/15/23  4:35 AM  Result Value Ref Range   WBC 9.8 4.0 - 10.5 K/uL   RBC 3.82 (L) 4.22 - 5.81 MIL/uL   Hemoglobin 10.0 (L) 13.0 - 17.0 g/dL   HCT 64.3 (L) 32.9 - 51.8 %   MCV 79.6 (L) 80.0 - 100.0 fL   MCH 26.2 26.0 - 34.0 pg   MCHC 32.9 30.0 - 36.0 g/dL   RDW 84.1 (H) 66.0 - 63.0 %   Platelets 255 150 - 400 K/uL   nRBC 0.0 0.0 - 0.2 %    Comment: Performed at [facility], [address], [place], [place] [address]  Basic metabolic panel     Status: Abnormal   Collection Time: 01/15/23  4:35 AM  Result Value Ref Range   Sodium 134 (L) 135 - 145 mmol/L   Potassium 3.6 3.5 - 5.1 mmol/L   Chloride 99 98 - 111 mmol/L   CO2 25 22 - 32 mmol/L   Glucose, Bld 138 (H) 70 - 99 mg/dL    Comment: Glucose reference range applies only to samples taken after fasting for at least 8 hours.   BUN 35 (H) 8 - 23 mg/dL   Creatinine, Ser 9.32 (H) 0.61 - 1.24 mg/dL   Calcium 8.7 (L) 8.9 - 10.3 mg/dL   GFR, Estimated 49 (L) >60 mL/min    Comment: (NOTE) Calculated using the CKD-EPI Creatinine Equation (2021)    Anion gap 10 5 - 15    Comment: Performed at [facility], [address] [place], [place] [address]  APTT     Status: Abnormal   Collection Time: 01/15/23  4:35 AM  Result Value Ref Range   aPTT 55 (H) 24 - 36 seconds    Comment:        IF BASELINE aPTT IS ELEVATED, SUGGEST PATIENT RISK ASSESSMENT BE USED TO DETERMINE APPROPRIATE ANTICOAGULANT THERAPY. Performed at [facility], [address] [place], [place] [address]   Heparin level (unfractionated)     Status: Abnormal   Collection Time: 01/15/23  4:35 AM  Result Value Ref Range   Heparin Unfractionated >1.10 (H) 0.30 - 0.70 IU/mL    Comment: (NOTE) The clinical reportable range upper limit is  being lowered to >1.10 to align with the FDA approved guidance for the current laboratory assay.  If heparin results are below expected values, and patient dosage has  been confirmed, suggest follow up testing of antithrombin III levels. Performed at [facility], [address] [place], [place] [address]   Glucose, capillary     Status: Abnormal   Collection Time: 01/15/23  9:11 AM  Result Value Ref Range   Glucose-Capillary 180 (H) 70 - 99 mg/dL    Comment: Glucose reference range applies only to samples taken after fasting for at least 8 hours.  Glucose, capillary     Status: Abnormal   Collection Time: 01/15/23 12:37 PM  Result Value Ref Range   Glucose-Capillary 204 (H) 70 - 99 mg/dL    Comment: Glucose reference range applies only to samples taken after fasting for at least 8 hours.  APTT     Status: Abnormal   Collection Time: 01/15/23  2:56 PM  Result Value Ref Range   aPTT 104 (H) 24 - 36 seconds    Comment:        IF BASELINE aPTT IS ELEVATED, SUGGEST PATIENT RISK ASSESSMENT BE USED TO DETERMINE APPROPRIATE ANTICOAGULANT THERAPY. Performed at [facility], [address] [place],  [place] [address]   Glucose, capillary     Status: Abnormal   Collection Time: 01/15/23  6:09 PM  Result Value Ref Range   Glucose-Capillary 112 (H) 70 - 99 mg/dL    Comment: Glucose reference range applies only to samples taken after fasting for at least 8 hours.    MR FOOT RIGHT WO CONTRAST  Result Date: 01/15/2023 CLINICAL DATA:  Lateral foot ulcer. EXAM: MRI OF THE RIGHT FOREFOOT WITHOUT CONTRAST TECHNIQUE: Multiplanar, multisequence MR imaging of the right forefoot was performed. No intravenous contrast was administered. COMPARISON:  None Available. FINDINGS: Bones/Joint/Cartilage Bony destruction and pathologic fracture of the fifth metatarsal head. Prominent marrow edema involving the proximal half of the fifth proximal phalanx. Large fifth MTP joint effusion.  Fifth MTP joint subluxation. Ligaments Fifth MTP joint collateral ligaments are torn. Remaining toe collateral ligaments are intact. Lisfranc ligament is intact. Muscles and Tendons Flexor and extensor tendons are intact. No tenosynovitis. Increased T2 signal within the intrinsic muscles of the forefoot, nonspecific, but likely related to diabetic muscle changes. Soft tissue Lateral forefoot ulceration adjacent to the fifth MTP joint extending to bone. Small 6 x 9 x 8 mm fluid collection superior to the fifth MTP joint. No soft tissue mass. IMPRESSION: 1. Lateral forefoot ulceration adjacent to the fifth MTP joint extending to bone. 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 Fifth MTP joint septic arthritis. Small subcentimeter abscess superior to the fifth MTP joint. Electronically Signed   By: Obie Dredge M.D.   On: 01/15/2023 12:56   DG Chest 2 View  Result Date: 01/14/2023 CLINICAL DATA:  History of chronic heart failure with ICD in-situ EXAM: CHEST - 2 VIEW COMPARISON:  Chest radiograph dated 07/11/2004 FINDINGS: Lines/tubes: Left chest wall ICD leads project over the right atrium and ventricle. Lungs: Well inflated lungs. No focal consolidation. Pleura: No pneumothorax or pleural effusion. Heart/mediastinum: The heart size and mediastinal contours are within normal limits. Bones: No acute osseous abnormality. IMPRESSION: Left chest wall ICD leads project over the right atrium and ventricle. No pneumothorax. Electronically Signed   By: Agustin Cree M.D.   On: 01/14/2023 17:52    Review of Systems  Constitutional:  Negative for chills and fever.  Respiratory:  Negative for shortness of breath.   Cardiovascular:  Negative for chest pain.  Gastrointestinal:  Negative for nausea and vomiting.   Blood pressure (!) 105/55, pulse 86, temperature 98.3 F (36.8 C), temperature source Oral, resp. rate 18, height 5\' 10"  (1.778 m), weight 74.4 kg, SpO2 99%.  Vitals:   01/15/23 1239  01/15/23 2012  BP: 116/75 (!) 105/55  Pulse: 88 86  Resp: 18 18  Temp: 98.1 F (36.7 C) 98.3 F (36.8 C)  SpO2: 100% 99%    General AA&O x3. Normal mood and affect.  Vascular Right foot warm, capillary fill time intact, weakly palpable pulses  Neurologic Epicritic sensation grossly absent.  Dermatologic (Wound) Right foot dressing is clean and intact  Orthopedic: Motor intact BLE.    Assessment/Plan:  Right foot osteomyelitis, PAD -Imaging: Studies independently reviewed.  Agree with interpretation. -Antibiotics: On broad-spectrum antibiotics can continue this -WB Status: WBAT to right lower extremity -Surgical Plan: Plan for or tomorrow with Dr. Allena Katz for fifth ray resection.  N.p.o. after midnight. -ABI appears to be decreased since previous study.  He had upcoming follow-up next week with Dr. Wyn Quaker from vascular surgery.  Recommend consultation pending results of OR tomorrow if there is poor intraoperative bleeding  Rachelle Hora  Shironda Kain 01/15/2023, 8:29 PM   Best available via secure chat for questions or concerns.

## 2023-01-15 NOTE — Progress Notes (Signed)
Pharmacy Antibiotic Note  Dean King is a 77 y.o. male w/ PMH of CHF, on AICD, liver transplant, PAF on Eliquis, PVD, IIDM, chronic right foot ulcer, PVD  admitted on 01/14/2023 with  wound infection .  Pharmacy has been consulted for vancomycin and cefepime dosing. Serum creatinine elevated from baseline but improving  Plan:  1) continue cefepime 2 grams IV every 12 hours ---follow renal function for needed dose adjustments  2) vancomycin s/p 1750 mg IV loading dose --start vancomycin 1000 mg IV every 24 hours Goal AUC 400-550. Expected AUC: 461.3 SCr used: 1.47 mg/dL Ke 4.098J-1, B1/4 78G  Height: 5\' 10"  (177.8 cm) Weight: 74.4 kg (164 lb) IBW/kg (Calculated) : 73  Temp (24hrs), Avg:98 F (36.7 C), Min:97.6 F (36.4 C), Max:98.5 F (36.9 C)  Recent Labs  Lab 01/14/23 0948 01/15/23 0435  WBC 12.8* 9.8  CREATININE 1.73* 1.47*    Estimated Creatinine Clearance: 43.5 mL/min (A) (by C-G formula based on SCr of 1.47 mg/dL (H)).    Allergies  Allergen Reactions   Levaquin [Levofloxacin] Anaphylaxis    Antimicrobials this admission: cefepime 10/22 >>  vancomycin 10/22 >>   Microbiology results: N/a  Thank you for allowing pharmacy to be a part of this patient's care.  Lowella Bandy 01/15/2023 7:35 AM

## 2023-01-15 NOTE — Plan of Care (Signed)
  Problem: Education: Goal: Ability to describe self-care measures that may prevent or decrease complications (Diabetes Survival Skills Education) will improve Outcome: Progressing Goal: Individualized Educational Video(s) Outcome: Progressing   

## 2023-01-15 NOTE — Progress Notes (Signed)
Patient returned from [facility]

## 2023-01-15 NOTE — Progress Notes (Signed)
PHARMACY - ANTICOAGULATION CONSULT NOTE  Pharmacy Consult for IV heparin Indication: atrial fibrillation  Allergies  Allergen Reactions   Levaquin [Levofloxacin] Anaphylaxis    Patient Measurements: Height: 5\' 10"  (177.8 cm) Weight: 74.4 kg (164 lb) IBW/kg (Calculated) : 73 Heparin Dosing Weight: 74.4 kg  Vital Signs: Temp: 97.6 F (36.4 C) (10/22 2208) Temp Source: Oral (10/22 2208) BP: 136/66 (10/22 2208) Pulse Rate: 93 (10/22 2208)  Labs: Recent Labs    01/14/23 0948 01/14/23 1422 01/15/23 0435  HGB 11.6*  --  10.0*  HCT 36.0*  --  30.4*  PLT 289  --  255  APTT  --  28 55*  LABPROT  --  19.3*  --   INR  --  1.6*  --   HEPARINUNFRC  --  >1.10* >1.10*  CREATININE 1.73*  --  1.47*    Estimated Creatinine Clearance: 43.5 mL/min (A) (by C-G formula based on SCr of 1.47 mg/dL (H)).   Medical History: Past Medical History:  Diagnosis Date   AICD (automatic cardioverter/defibrillator) present    Diabetes mellitus without complication (HCC)    Hypertension    Myocardial infarction [facility])    x2 2005, 2006    Medications:  Eliquis last dose 10/22 AM  Assessment: 77 year old male admitted with diabetic ulcer infection. PMH includes Afib on Eliquis. Last dose of Eliquis 10/22 AM.   Goal of Therapy:  aPTT 66-102 seconds Monitor platelets by anticoagulation protocol: Yes   Plan: aPTT = slightly supratherapeutic - Will decrease heparin infusion  rate to 1150 units/hr - Will recheck aPTT 8 hrs after rate change - Will recheck HL on 10/24 with AM labs Continue to monitor H&H and platelets   Lowella Bandy Clinical Pharmacist  01/15/2023 7:35 AM

## 2023-01-16 ENCOUNTER — Encounter: Payer: Self-pay | Admitting: Internal Medicine

## 2023-01-16 ENCOUNTER — Telehealth: Payer: Self-pay | Admitting: Podiatry

## 2023-01-16 ENCOUNTER — Other Ambulatory Visit: Payer: Self-pay

## 2023-01-16 ENCOUNTER — Encounter
Admission: EM | Disposition: A | Discharge status: Home / Self Care | Payer: Self-pay | Source: Home / Self Care | Attending: Internal Medicine

## 2023-01-16 ENCOUNTER — Inpatient Hospital Stay: Payer: Medicare Other | Admitting: Anesthesiology

## 2023-01-16 ENCOUNTER — Inpatient Hospital Stay: Payer: Medicare Other

## 2023-01-16 DIAGNOSIS — M86171 Other acute osteomyelitis, right ankle and foot: Secondary | ICD-10-CM

## 2023-01-16 DIAGNOSIS — M869 Osteomyelitis, unspecified: Secondary | ICD-10-CM | POA: Diagnosis not present

## 2023-01-16 DIAGNOSIS — Z7984 Long term (current) use of oral hypoglycemic drugs: Secondary | ICD-10-CM

## 2023-01-16 DIAGNOSIS — I70202 Unspecified atherosclerosis of native arteries of extremities, left leg: Secondary | ICD-10-CM

## 2023-01-16 DIAGNOSIS — L97519 Non-pressure chronic ulcer of other part of right foot with unspecified severity: Secondary | ICD-10-CM

## 2023-01-16 DIAGNOSIS — I10 Essential (primary) hypertension: Secondary | ICD-10-CM

## 2023-01-16 DIAGNOSIS — F17290 Nicotine dependence, other tobacco product, uncomplicated: Secondary | ICD-10-CM

## 2023-01-16 DIAGNOSIS — M86471 Chronic osteomyelitis with draining sinus, right ankle and foot: Secondary | ICD-10-CM | POA: Diagnosis not present

## 2023-01-16 DIAGNOSIS — I70235 Atherosclerosis of native arteries of right leg with ulceration of other part of foot: Secondary | ICD-10-CM | POA: Diagnosis not present

## 2023-01-16 DIAGNOSIS — Z9889 Other specified postprocedural states: Secondary | ICD-10-CM

## 2023-01-16 DIAGNOSIS — I2510 Atherosclerotic heart disease of native coronary artery without angina pectoris: Secondary | ICD-10-CM

## 2023-01-16 DIAGNOSIS — E1151 Type 2 diabetes mellitus with diabetic peripheral angiopathy without gangrene: Secondary | ICD-10-CM

## 2023-01-16 DIAGNOSIS — Z89421 Acquired absence of other right toe(s): Secondary | ICD-10-CM

## 2023-01-16 HISTORY — PX: AMPUTATION: SHX166

## 2023-01-16 LAB — CBC
HCT: 30.6 % — ABNORMAL LOW (ref 39.0–52.0)
Hemoglobin: 10.3 g/dL — ABNORMAL LOW (ref 13.0–17.0)
MCH: 26.7 pg (ref 26.0–34.0)
MCHC: 33.7 g/dL (ref 30.0–36.0)
MCV: 79.3 fL — ABNORMAL LOW (ref 80.0–100.0)
Platelets: 269 10*3/uL (ref 150–400)
RBC: 3.86 MIL/uL — ABNORMAL LOW (ref 4.22–5.81)
RDW: 20.4 % — ABNORMAL HIGH (ref 11.5–15.5)
WBC: 9.3 10*3/uL (ref 4.0–10.5)
nRBC: 0 % (ref 0.0–0.2)

## 2023-01-16 LAB — HEPARIN LEVEL (UNFRACTIONATED): Heparin Unfractionated: 0.82 [IU]/mL — ABNORMAL HIGH (ref 0.30–0.70)

## 2023-01-16 LAB — CREATININE, SERUM
Creatinine, Ser: 1.53 mg/dL — ABNORMAL HIGH (ref 0.61–1.24)
GFR, Estimated: 47 mL/min — ABNORMAL LOW (ref >60)

## 2023-01-16 LAB — GLUCOSE, CAPILLARY
Glucose-Capillary: 113 mg/dL — ABNORMAL HIGH (ref 70–99)
Glucose-Capillary: 121 mg/dL — ABNORMAL HIGH (ref 70–99)
Glucose-Capillary: 128 mg/dL — ABNORMAL HIGH (ref 70–99)
Glucose-Capillary: 146 mg/dL — ABNORMAL HIGH (ref 70–99)
Glucose-Capillary: 170 mg/dL — ABNORMAL HIGH (ref 70–99)
Glucose-Capillary: 187 mg/dL — ABNORMAL HIGH (ref 70–99)

## 2023-01-16 LAB — APTT
aPTT: 67 s — ABNORMAL HIGH (ref 24–36)
aPTT: 82 s — ABNORMAL HIGH (ref 24–36)

## 2023-01-16 SURGERY — AMPUTATION, FOOT, RAY
Anesthesia: Monitor Anesthesia Care | Site: Fifth Toe | Laterality: Right

## 2023-01-16 MED ORDER — EPHEDRINE 5 MG/ML INJ
INTRAVENOUS | Status: AC
  Filled 2023-01-16: qty 5

## 2023-01-16 MED ORDER — FENTANYL CITRATE (PF) 100 MCG/2ML IJ SOLN
INTRAMUSCULAR | Status: DC | PRN
  Administered 2023-01-16: 50 ug via INTRAVENOUS

## 2023-01-16 MED ORDER — 0.9 % SODIUM CHLORIDE (POUR BTL) OPTIME
TOPICAL | Status: DC | PRN
  Administered 2023-01-16: 1000 mL

## 2023-01-16 MED ORDER — LIDOCAINE HCL (CARDIAC) PF 100 MG/5ML IV SOSY
PREFILLED_SYRINGE | INTRAVENOUS | Status: DC | PRN
  Administered 2023-01-16: 25 mg via INTRAVENOUS

## 2023-01-16 MED ORDER — SODIUM CHLORIDE 0.9 % IV SOLN: INTRAVENOUS | Status: DC | PRN

## 2023-01-16 MED ORDER — GABAPENTIN 100 MG PO CAPS
100.0000 mg | ORAL_CAPSULE | Freq: Three times a day (TID) | ORAL | Status: DC
  Administered 2023-01-16 – 2023-01-18 (×5): 100 mg via ORAL
  Filled 2023-01-16 (×5): qty 1

## 2023-01-16 MED ORDER — OXYCODONE HCL 5 MG/5ML PO SOLN: 5.0000 mg | Freq: Once | ORAL | Status: DC | PRN

## 2023-01-16 MED ORDER — CEFAZOLIN SODIUM-DEXTROSE 2-4 GM/100ML-% IV SOLN
2.0000 g | INTRAVENOUS | Status: AC
  Administered 2023-01-17 (×2): 2 g via INTRAVENOUS
  Filled 2023-01-16: qty 100

## 2023-01-16 MED ORDER — LIDOCAINE-EPINEPHRINE 1 %-1:100000 IJ SOLN
INTRAMUSCULAR | Status: AC
Start: 2023-01-16 — End: ?
  Filled 2023-01-16: qty 1

## 2023-01-16 MED ORDER — OXYCODONE HCL 5 MG PO TABS: 5.0000 mg | ORAL_TABLET | Freq: Once | ORAL | Status: DC | PRN

## 2023-01-16 MED ORDER — LIDOCAINE HCL 1 % IJ SOLN
INTRAMUSCULAR | Status: DC | PRN
  Administered 2023-01-16: 9 mL via INTRAMUSCULAR

## 2023-01-16 MED ORDER — GABAPENTIN 100 MG PO CAPS: 100.0000 mg | ORAL_CAPSULE | Freq: Three times a day (TID) | ORAL | Status: DC

## 2023-01-16 MED ORDER — PROPOFOL 1000 MG/100ML IV EMUL
INTRAVENOUS | Status: AC
Start: 2023-01-16 — End: ?
  Filled 2023-01-16: qty 100

## 2023-01-16 MED ORDER — EPHEDRINE SULFATE-NACL 50-0.9 MG/10ML-% IV SOSY
PREFILLED_SYRINGE | INTRAVENOUS | Status: DC | PRN
  Administered 2023-01-16: 5 mg via INTRAVENOUS
  Administered 2023-01-16: 10 mg via INTRAVENOUS
  Administered 2023-01-16: 5 mg via INTRAVENOUS

## 2023-01-16 MED ORDER — FENTANYL CITRATE (PF) 100 MCG/2ML IJ SOLN
INTRAMUSCULAR | Status: AC
  Filled 2023-01-16: qty 2

## 2023-01-16 MED ORDER — HYDROMORPHONE HCL 1 MG/ML IJ SOLN
1.0000 mg | Freq: Once | INTRAMUSCULAR | Status: AC
  Administered 2023-01-16: 1 mg via INTRAVENOUS

## 2023-01-16 MED ORDER — ONDANSETRON HCL 4 MG/2ML IJ SOLN: 4.0000 mg | Freq: Once | INTRAMUSCULAR | Status: DC | PRN

## 2023-01-16 MED ORDER — PROPOFOL 10 MG/ML IV BOLUS
INTRAVENOUS | Status: DC | PRN
  Administered 2023-01-16: 30 mg via INTRAVENOUS

## 2023-01-16 MED ORDER — LIDOCAINE HCL (PF) 1 % IJ SOLN
INTRAMUSCULAR | Status: AC
  Filled 2023-01-16: qty 30

## 2023-01-16 MED ORDER — PHENYLEPHRINE 80 MCG/ML (10ML) SYRINGE FOR IV PUSH (FOR BLOOD PRESSURE SUPPORT)
PREFILLED_SYRINGE | INTRAVENOUS | Status: DC | PRN
  Administered 2023-01-16 (×4): 160 ug via INTRAVENOUS

## 2023-01-16 MED ORDER — FENTANYL CITRATE (PF) 100 MCG/2ML IJ SOLN
25.0000 ug | INTRAMUSCULAR | Status: DC | PRN
Start: 2023-01-16 — End: 2023-01-16
  Administered 2023-01-16 (×4): 25 ug via INTRAVENOUS

## 2023-01-16 MED ORDER — BUPIVACAINE HCL (PF) 0.5 % IJ SOLN
INTRAMUSCULAR | Status: AC
  Filled 2023-01-16: qty 30

## 2023-01-16 MED ORDER — HYDROMORPHONE HCL 1 MG/ML IJ SOLN
INTRAMUSCULAR | Status: AC
  Filled 2023-01-16: qty 1

## 2023-01-16 MED ORDER — PROPOFOL 500 MG/50ML IV EMUL
INTRAVENOUS | Status: DC | PRN
  Administered 2023-01-16: 75 ug/kg/min via INTRAVENOUS

## 2023-01-16 MED ORDER — PHENYLEPHRINE 80 MCG/ML (10ML) SYRINGE FOR IV PUSH (FOR BLOOD PRESSURE SUPPORT)
PREFILLED_SYRINGE | INTRAVENOUS | Status: AC
  Filled 2023-01-16: qty 10

## 2023-01-16 MED ORDER — HYDROMORPHONE HCL 1 MG/ML IJ SOLN
0.2500 mg | Freq: Once | INTRAMUSCULAR | Status: AC
  Administered 2023-01-16: 0.25 mg via INTRAVENOUS

## 2023-01-16 SURGICAL SUPPLY — 47 items
BAG COUNTER SPONGE SURGICOUNT (BAG) IMPLANT
BAG SPNG CNTER NS LX DISP (BAG)
BASIN KIT SINGLE STR (MISCELLANEOUS) ×1 IMPLANT
BLADE OSC/SAGITTAL MD 5.5X18 (BLADE) IMPLANT
BLADE SURG 15 STRL LF DISP TIS (BLADE) ×1 IMPLANT
BLADE SURG 15 STRL SS (BLADE) ×1
BLADE SURG MINI STRL (BLADE) ×1 IMPLANT
BNDG CMPR 5X4 CHSV STRCH STRL (GAUZE/BANDAGES/DRESSINGS) ×1
BNDG CMPR 5X4 KNIT ELC UNQ LF (GAUZE/BANDAGES/DRESSINGS) ×1
BNDG COHESIVE 4X5 TAN STRL LF (GAUZE/BANDAGES/DRESSINGS) ×1 IMPLANT
BNDG ELASTIC 4INX 5YD STR LF (GAUZE/BANDAGES/DRESSINGS) ×1 IMPLANT
BNDG ESMARCH 4 X 12 STRL LF (GAUZE/BANDAGES/DRESSINGS) ×1
BNDG ESMARCH 4X12 STRL LF (GAUZE/BANDAGES/DRESSINGS) ×1 IMPLANT
BNDG GAUZE DERMACEA FLUFF 4 (GAUZE/BANDAGES/DRESSINGS) ×2 IMPLANT
BNDG GZE DERMACEA 4 6PLY (GAUZE/BANDAGES/DRESSINGS) ×2
COVER LIGHT HANDLE STERIS (MISCELLANEOUS) ×2 IMPLANT
CUFF TOURN SGL QUICK 18X4 (TOURNIQUET CUFF) ×1 IMPLANT
DRAPE FLUOR MINI C-ARM 54X84 (DRAPES) ×1 IMPLANT
DURAPREP 26ML APPLICATOR (WOUND CARE) ×1 IMPLANT
ELECT REM PT RETURN 9FT ADLT (ELECTROSURGICAL) ×1
ELECTRODE REM PT RTRN 9FT ADLT (ELECTROSURGICAL) ×1 IMPLANT
GAUZE PAD ABD 8X10 STRL (GAUZE/BANDAGES/DRESSINGS) ×1 IMPLANT
GAUZE SPONGE 4X4 12PLY STRL (GAUZE/BANDAGES/DRESSINGS) IMPLANT
GLOVE BIO SURGEON STRL SZ7 (GLOVE) ×1 IMPLANT
GLOVE BIOGEL PI IND STRL 7.5 (GLOVE) ×1 IMPLANT
GOWN STRL REUS W/ TWL LRG LVL3 (GOWN DISPOSABLE) ×2 IMPLANT
GOWN STRL REUS W/ TWL XL LVL3 (GOWN DISPOSABLE) ×1 IMPLANT
GOWN STRL REUS W/TWL LRG LVL3 (GOWN DISPOSABLE) ×2
GOWN STRL REUS W/TWL XL LVL3 (GOWN DISPOSABLE) ×1
HANDPIECE INTERPULSE COAX TIP (DISPOSABLE) ×1
HANDPIECE VERSAJET DEBRIDEMENT (MISCELLANEOUS) IMPLANT
IV NS 1000ML (IV SOLUTION) ×1
IV NS 1000ML BAXH (IV SOLUTION) ×1 IMPLANT
KIT TURNOVER KIT A (KITS) IMPLANT
MANIFOLD NEPTUNE II (INSTRUMENTS) ×1 IMPLANT
NDL HYPO 22X1.5 SAFETY MO (MISCELLANEOUS) IMPLANT
NEEDLE HYPO 22X1.5 SAFETY MO (MISCELLANEOUS) IMPLANT
NS IRRIG 1000ML POUR BTL (IV SOLUTION) ×1 IMPLANT
PACK EXTREMITY ARMC (MISCELLANEOUS) ×1 IMPLANT
PAD ARMBOARD 7.5X6 YLW CONV (MISCELLANEOUS) ×2 IMPLANT
SET HNDPC FAN SPRY TIP SCT (DISPOSABLE) ×1 IMPLANT
STAPLER SKIN PROX 35W (STAPLE) ×1 IMPLANT
SUT PROLENE 2 0 FS (SUTURE) ×1 IMPLANT
SUT PROLENE 3 0 PS 2 (SUTURE) ×1 IMPLANT
SWAB CULTURE AMIES ANAERIB BLU (MISCELLANEOUS) IMPLANT
SYR CONTROL 10ML LL (SYRINGE) IMPLANT
TRAP FLUID SMOKE EVACUATOR (MISCELLANEOUS) ×1 IMPLANT

## 2023-01-16 NOTE — Anesthesia Preprocedure Evaluation (Signed)
Anesthesia Evaluation  Patient identified by MRN, date of birth, ID band Patient awake    Reviewed: Allergy & Precautions, NPO status , Patient's Chart, lab work & pertinent test results  Airway Mallampati: II  TM Distance: >3 FB Neck ROM: full    Dental  (+) Implants, Missing, Dental Advisory Given   Pulmonary neg pulmonary ROS, Current Smoker and Patient abstained from smoking.   Pulmonary exam normal breath sounds clear to auscultation       Cardiovascular Exercise Tolerance: Good hypertension, Pt. on medications + CAD, + Past MI, + Cardiac Stents and + Peripheral Vascular Disease  negative cardio ROS Normal cardiovascular exam+ Cardiac Defibrillator  Rhythm:Regular Rate:Normal     Neuro/Psych negative neurological ROS  negative psych ROS   GI/Hepatic negative GI ROS, Neg liver ROS,,,(+) Cirrhosis       , Hepatitis -, CS/p liver transplant 20 years ago   Endo/Other  negative endocrine ROSdiabetes, Type 2, Oral Hypoglycemic Agents    Renal/GU   negative genitourinary   Musculoskeletal   Abdominal Normal abdominal exam  (+)   Peds negative pediatric ROS (+)  Hematology negative hematology ROS (+)   Anesthesia Other Findings Past Medical History: No date: AICD (automatic cardioverter/defibrillator) present No date: Diabetes mellitus without complication (HCC) No date: Hypertension No date: Myocardial infarction [facility])     Comment:  x2 2005, 2006  Past Surgical History: 2023: ABLATION No date: CARDIAC CATHETERIZATION 04/05/2021: CAROTID PTA/STENT INTERVENTION; Left     Comment:  Procedure: CAROTID PTA/STENT INTERVENTION;  Surgeon:               Annice Needy, MD;  Location: [facility] INVASIVE CV LAB;                Service: Cardiovascular;  Laterality: Left; No date: CHOLECYSTECTOMY No date: CORONARY ANGIOPLASTY No date: liver transplannt 06/20/2022: LOWER EXTREMITY ANGIOGRAPHY; Right     Comment:  Procedure:  Lower Extremity Angiography;  Surgeon: Annice Needy, MD;  Location: [facility] INVASIVE CV LAB;  Service:               Cardiovascular;  Laterality: Right; 07/25/2022: LOWER EXTREMITY ANGIOGRAPHY; Right     Comment:  Procedure: Lower Extremity Angiography;  Surgeon: Annice Needy, MD;  Location: [facility] INVASIVE CV LAB;  Service:               Cardiovascular;  Laterality: Right; 07/26/2022: LOWER EXTREMITY INTERVENTION; Right     Comment:  Procedure: LOWER EXTREMITY INTERVENTION;  Surgeon: Annice Needy, MD;  Location: [facility] INVASIVE CV LAB;  Service:               Cardiovascular;  Laterality: Right;  BMI    Body Mass Index: 23.53 kg/m      Reproductive/Obstetrics negative OB ROS                             Anesthesia Physical Anesthesia Plan  ASA: 3  Anesthesia Plan: MAC and General   Post-op Pain Management:    Induction: Intravenous  PONV Risk Score and Plan: Dexamethasone, Ondansetron, Midazolam and Treatment may vary due to age or medical condition  Airway Management Planned: LMA and Natural Airway  Additional Equipment:   Intra-op Plan:   Post-operative Plan: Extubation in OR  Informed Consent: I have reviewed the patients History and Physical, chart, labs and discussed the procedure including the risks, benefits and alternatives for the proposed anesthesia with the patient or authorized representative who has indicated his/her understanding and acceptance.     Dental Advisory Given  Plan Discussed with: CRNA and Surgeon  Anesthesia Plan Comments:        Anesthesia Quick Evaluation

## 2023-01-16 NOTE — Progress Notes (Signed)
PHARMACY - ANTICOAGULATION CONSULT NOTE  Pharmacy Consult for IV heparin Indication: atrial fibrillation  Allergies  Allergen Reactions   Levaquin [Levofloxacin] Anaphylaxis    Patient Measurements: Height: 5\' 10"  (177.8 cm) Weight: 74.4 kg (164 lb) IBW/kg (Calculated) : 73 Heparin Dosing Weight: 74.4 kg  Vital Signs: Temp: 97.9 F (36.6 C) (10/24 0429) Temp Source: Oral (10/24 0429) BP: 110/65 (10/24 0429) Pulse Rate: 80 (10/24 0429)  Labs: Recent Labs    01/14/23 0948 01/14/23 1422 01/14/23 1422 01/15/23 0435 01/15/23 1456 01/16/23 0011  HGB 11.6*  --   --  10.0*  --   --   HCT 36.0*  --   --  30.4*  --   --   PLT 289  --   --  255  --   --   APTT  --  28   < > 55* 104* 67*  LABPROT  --  19.3*  --   --   --   --   INR  --  1.6*  --   --   --   --   HEPARINUNFRC  --  >1.10*  --  >1.10*  --   --   CREATININE 1.73*  --   --  1.47*  --   --    < > = values in this interval not displayed.    Estimated Creatinine Clearance: 43.5 mL/min (A) (by C-G formula based on SCr of 1.47 mg/dL (H)).   Medical History: Past Medical History:  Diagnosis Date   AICD (automatic cardioverter/defibrillator) present    Diabetes mellitus without complication (HCC)    Hypertension    Myocardial infarction [facility])    x2 2005, 2006    Medications:  Eliquis last dose 10/22 AM  Assessment: 77 year old male admitted with diabetic ulcer infection. PMH includes Afib on Eliquis. Last dose of Eliquis 10/22 AM.  Goal of Therapy:  aPTT 66-102 seconds Monitor platelets by anticoagulation protocol: Yes   Plan: aPTT  therapeutic X 2  ---Will continue heparin at 1150 units/hr and recheck aPTT and HL in am 10/25 ---will use aPTT to guide dosing until correlating with HL.  ---Continue to monitor H&H and platelets   Lowella Bandy Clinical Pharmacist  01/16/2023 7:08 AM

## 2023-01-16 NOTE — Transfer of Care (Signed)
Immediate Anesthesia Transfer of Care Note  Patient: Dean King  Procedure(s) Performed: PARTIAL 5TH RAY (Right)  Patient Location: PACU  Anesthesia Type:MAC  Level of Consciousness: awake, alert , oriented, and patient cooperative  Airway & Oxygen Therapy: Patient Spontanous Breathing  Post-op Assessment: Report given to RN and Post -op Vital signs reviewed and stable  Post vital signs: Reviewed and stable  Last Vitals:  Vitals Value Taken Time  BP 88/46 01/16/23 1245  Temp    Pulse 80 01/16/23 1247  Resp 20 01/16/23 1247  SpO2 98 % 01/16/23 1247  Vitals shown include unfiled device data.  Last Pain:  Vitals:   01/16/23 1050  TempSrc: Temporal  PainSc: 1       Patients Stated Pain Goal: 0 (01/16/23 0447)  Complications: No notable events documented.

## 2023-01-16 NOTE — Interval H&P Note (Signed)
History and Physical Interval Note:  01/16/2023 11:46 AM  Dean King  has presented today for surgery, with the diagnosis of Osteomyelitis.  The various methods of treatment have been discussed with the patient and family. After consideration of risks, benefits and other options for treatment, the patient has consented to  Procedure(s): PARTIAL 5TH RAY (Right) as a surgical intervention.  The patient's history has been reviewed, patient examined, no change in status, stable for surgery.  I have reviewed the patient's chart and labs.  Questions were answered to the patient's satisfaction.     Candelaria Stagers

## 2023-01-16 NOTE — Progress Notes (Signed)
PHARMACY - ANTICOAGULATION CONSULT NOTE  Pharmacy Consult for IV heparin Indication: atrial fibrillation  Allergies  Allergen Reactions   Levaquin [Levofloxacin] Anaphylaxis    Patient Measurements: Height: 5\' 10"  (177.8 cm) Weight: 74.4 kg (164 lb) IBW/kg (Calculated) : 73 Heparin Dosing Weight: 74.4 kg  Vital Signs: Temp: 98.3 F (36.8 C) (10/23 2012) Temp Source: Oral (10/23 2012) BP: 105/55 (10/23 2012) Pulse Rate: 86 (10/23 2012)  Labs: Recent Labs    01/14/23 0948 01/14/23 1422 01/14/23 1422 01/15/23 0435 01/15/23 1456 01/16/23 0011  HGB 11.6*  --   --  10.0*  --   --   HCT 36.0*  --   --  30.4*  --   --   PLT 289  --   --  255  --   --   APTT  --  28   < > 55* 104* 67*  LABPROT  --  19.3*  --   --   --   --   INR  --  1.6*  --   --   --   --   HEPARINUNFRC  --  >1.10*  --  >1.10*  --   --   CREATININE 1.73*  --   --  1.47*  --   --    < > = values in this interval not displayed.    Estimated Creatinine Clearance: 43.5 mL/min (A) (by C-G formula based on SCr of 1.47 mg/dL (H)).   Medical History: Past Medical History:  Diagnosis Date   AICD (automatic cardioverter/defibrillator) present    Diabetes mellitus without complication (HCC)    Hypertension    Myocardial infarction [facility])    x2 2005, 2006    Medications:  Eliquis last dose 10/22 AM  Assessment: 77 year old male admitted with diabetic ulcer infection. PMH includes Afib on Eliquis. Last dose of Eliquis 10/22 AM.   Goal of Therapy:  aPTT 66-102 seconds Monitor platelets by anticoagulation protocol: Yes   Plan:  10/24:   0011 = 67, therapeutic X 1  - Will continue pt on current rate and recheck aPTT and HL in 8 hrs on 10/24 @ 0800. - will use aPTT to guide dosing until correlating with HL.  Continue to monitor H&H and platelets   Farouk Vivero D Clinical Pharmacist  01/16/2023 1:41 AM

## 2023-01-16 NOTE — Op Note (Signed)
Surgeon: Surgeon(s): Candelaria Stagers, DPM  Assistants: None Pre-operative diagnosis: Osteomyelitis  Post-operative diagnosis: same Procedure: Procedure(s) (LRB): PARTIAL 5TH RAY (Right)  Pathology:  ID Type Source Tests Collected by Time Destination  1 : right 5th toe Amputation Toe, Right SURGICAL PATHOLOGY Candelaria Stagers, DPM 01/16/2023 1221   2 : right 5th toe (clean) Amputation Toe, Right SURGICAL PATHOLOGY Candelaria Stagers, DPM 01/16/2023 1233   A : right 5th toe (clean) Amputation Toe, Right AEROBIC/ANAEROBIC CULTURE W GRAM STAIN (SURGICAL/DEEP WOUND) Candelaria Stagers, DPM 01/16/2023 1226     Pertinent Intra-op findings: Osteomyelitis noted of the fifth digit and the metatarsal head.  Clean margin cultures were taken and sent to. Microbiology and pathology minimal bleeding noted. Anesthesia: Choice  Hemostasis: * Missing tourniquet times found for documented tourniquets in log: [id number] * EBL: 5 mL  Materials: 3-0 Prolene skin staple Injectables: 10 cc half percent Marcaine plain Complications: None  Indications for surgery: A 77 y.o. male presents with right foot osteomyelitis. Patient has failed all conservative therapy including but not limited to local wound care IV antibiotics. He wishes to have surgical correction of the foot/deformity. It was determined that patient would benefit from right partial fifth ray amputation. Informed surgical risk consent was reviewed and read aloud to the patient.  I reviewed the films.  I have discussed my findings with the patient in great detail.  I have discussed all risks including but not limited to infection, stiffness, scarring, limp, disability, deformity, damage to blood vessels and nerves, numbness, poor healing, need for braces, arthritis, chronic pain, amputation, death.  All benefits and realistic expectations discussed in great detail.  I have made no promises as to the outcome.  I have provided realistic expectations.  I have offered the  patient a 2nd opinion, which they have declined and assured me they preferred to proceed despite the risks   Procedure in detail: The patient was both verbally and visually identified by myself, the nursing staff, and anesthesia staff in the preoperative holding area. They were then transferred to the operating room and placed on the operative table in supine position.  Attention was directed to right lateral foot, a skin marker was used to delineate a fishmouth style incision.  Using #15 blade incision was laid down to epidermis dermal layer down to the level of the bone.  The fifth digit was disarticulated sent to pathology in standard technique.  Using sagittal saw partial fifth ray amputation was performed in standard technique with sent to pathology labeled as 30 along with the toe.  The wound was thoroughly irrigated with normal saline solution.  It is important no no further purulent drainage noted no acute signs of infection noted.  Bleeding was minimal.  After irrigation all tissue planes were thoroughly expressed no purulent drainage noted.  Clean cultures were taken and sent to micro and pathology and sterile manner.  The wound was primarily closed with 3-0 Prolene, skin staples.  The wound was dressed with Xeroform Curlex Ace bandage 4 x 4 gauze.  All bony prominences were adequately padded  At the conclusion of the procedure the patient was awoken from anesthesia and found to have tolerated the procedure well any complications. There were transferred to PACU with vital signs stable and vascular status intact.  Nicholes Rough, DPM

## 2023-01-16 NOTE — Progress Notes (Signed)
Progress Note   Patient: Dean King NWG:[id number] DOB: 05/05/1945 DOA: 01/14/2023     2 DOS: the patient was seen and examined on 01/16/2023   Brief hospital course:  Dean King is a 77 y.o. 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   Patient has history of chronic right foot ulcer been taking care of podiatry since March of this year.  Treatment including outpatient wound care center debridement and 2 rounds of p.o. cyclin, the most recent round of p.o. antibiotics completed about 3 to 4 weeks ago.  After that, patient seemed stable dry right foot ulcer.  Last Friday however patient started to develop severe pain associated with the right foot ulcer, last 2 days even putting weight on right foot and gravity triggered significant pain.  Denies any fever chills, no claudication.  Wife does report that the wound started to drain again with thick yellowish pus for the last 3 days.  Today, patient went to see podiatry who sent patient to ED.   ED Course: Afebrile, nontachycardic blood pressure 101/55.  WBC 9.6, hemoglobin 12.8, hemoglobin 11.6, creatinine 1.7 compared to baseline 1.1.   Patient was started on vancomycin and ceftriaxone in the ED.   Assessment and Plan:    Osteomyelitis (HCC) Right foot diabetic ulcer infection Patient presents to the ER for evaluation of severe pain involving a chronic right foot ulcer, difficulty bearing weight on his right foot and purulent drainage MRI of the right foot showed lateral forefoot ulceration adjacent to the fifth MTP joint extending to bone. 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 Fifth MTP joint septic arthritis. Small sub centimeter abscess superior to the fifth MTP joint. Patient is status post partial fifth ray amputation Appreciate podiatry input and they recommend 10 days of doxycycline PT  evaluation     AKI Baseline serum creatinine of 1.1.  1.73 on admission and down to 1.53 Patient's Entresto, spironolactone and Lasix were placed on hold for 24 hours Monitor renal function closely especially while on vancomycin     Chronic HFrEF Euvolemic Continue spironolactone, Entresto and furosemide     PAF Currently on a heparin drip pending podiatry intervention Continue to hold Eliquis     PVD Denied any claudication Had stenting x 2 in May on the right leg this year ABI's show moderate bilateral lower extremity peripheral arterial disease.  Continue Plavix and statin Consult vascular surgery    Type 2 diabetes mellitus Hold Amaryl and Farxiga while patient is hospitalized Maintain consistent carbohydrate diet Continue SSI   Liver transplant -Continue Prograf          Subjective: No new complaints.  Scheduled for surgery today  Physical Exam: Vitals:   01/16/23 1330 01/16/23 1345 01/16/23 1354 01/16/23 1359  BP: (!) 84/57 105/63    Pulse: 80 84 87 86  Resp: 20 12 17 11   Temp:    97.9 F (36.6 C)  TempSrc:      SpO2: 92% 100% 98% 98%  Weight:      Height:       Eyes: PERRL, lids and conjunctivae normal ENMT: Mucous membranes are moist. Posterior pharynx clear of any exudate or lesions.Normal dentition.  Neck: normal, supple, no masses, no thyromegaly Respiratory: clear to auscultation bilaterally, no wheezing, no crackles. Normal respiratory effort. No accessory muscle use.  Cardiovascular: Regular rate and rhythm, no murmurs /  rubs / gallops. No extremity edema. 2+ pedal pulses. No carotid bruits.  Abdomen: no tenderness, no masses palpated. No hepatosplenomegaly. Bowel sounds positive.  Musculoskeletal: no clubbing / cyanosis. No joint deformity upper and lower extremities. Good ROM, no contractures. Normal muscle tone.  Skin: Large ulcer on right lateral distal part of the foot, with purulent thick yellowish discharge Neurologic: CN 2-12  grossly intact. Sensation intact, DTR normal. Strength 5/5 in all 4.  Psychiatric: Normal judgment and insight. Alert and oriented x 3. Normal mood.        Data Reviewed: Labs reviewed.  Creatinine 1.53, white count 9.3, hemoglobin 10.3 There are no new results to review at this time.  Family Communication: Plan of care discussed with patient and his wife at the bedside.  They verbalized understanding agree with the plan  Disposition: Status is: Inpatient Remains inpatient appropriate because: Discharge planning  Planned Discharge Destination: Home with Home Health    Time spent: 33 minutes  Author: Lucile Shutters, MD 01/16/2023 2:20 PM  For on call review [email or web address].

## 2023-01-16 NOTE — Telephone Encounter (Signed)
Pt called and you did surgery on him today and he is having a lot of pain in the area and it is shooting up his leg. He thinks the bandage maybe too tight but the nurse will not loosen it as they said they did not have permission from the providers. Can you please call pt he is still in the hospital.

## 2023-01-16 NOTE — TOC CM/SW Note (Signed)
Patient of the unity for 5th ray amputation.  Requested MD to order PT eval when appropriate

## 2023-01-16 NOTE — Anesthesia Postprocedure Evaluation (Signed)
Anesthesia Post Note  Patient: Dean King  Procedure(s) Performed: PARTIAL 5TH RAY (Right)  Patient location during evaluation: PACU Anesthesia Type: MAC Level of consciousness: awake and awake and alert Pain management: satisfactory to patient Vital Signs Assessment: post-procedure vital signs reviewed and stable Respiratory status: spontaneous breathing Cardiovascular status: blood pressure returned to baseline Anesthetic complications: no   No notable events documented.   Last Vitals:  Vitals:   01/16/23 1050 01/16/23 1245  BP: 118/63 (!) 88/46  Pulse: 85 77  Resp: 17 18  Temp: (!) 36 C (!) 36.1 C  SpO2: 95% 98%    Last Pain:  Vitals:   01/16/23 1245  TempSrc:   PainSc: 0-No pain                 VAN STAVEREN,Christoph Copelan

## 2023-01-16 NOTE — Consult Note (Signed)
MRN : [id number]  Dean King is a 77 y.o. (02/21/1946) male who presents with chief complaint of check circulation.  History of Present Illness:   I am asked to evaluate the patient by Dr. Joylene Igo.  Patient is a 77 year old gentleman who has been seen in the past by Dr. 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  He presented to [facility] approximately 2 days ago with cellulitis and ulceration of the right foot.  Earlier today he was taken to surgery by Dr. Allena Katz where right fifth ray amputation was performed.  At the time of surgery bleeding was noted to be minimal.  He is status post right lower extremity angiography by Dr. Wyn Quaker Jul 26, 2022.  At that time thrombectomy of the SFA was performed with placement of Viabahn stent.  The right tibioperoneal trunk was also treated with 4 mm angioplasty and a life stent was placed across the right tibioperoneal trunk which was postdilated to 4 mm.  The peroneal artery was treated with a 3 mm balloon inflation.  Since that time he had improved significantly.  He noted the abrupt change in the right fifth toe this Sunday and presented to the podiatrist office on Tuesday where he was admitted to [facility].  The patient denies rest pain or dangling of an extremity off the side of the bed during the night for relief. No prior interventions or surgeries.  No history of back problems or DJD of the lumbar sacral spine.   The patient denies amaurosis fugax or recent TIA symptoms. There are no recent neurological changes noted. The patient denies history of DVT, PE or superficial thrombophlebitis. The patient denies recent episodes of angina or shortness of breath.   Current Meds  Medication Sig   atorvastatin (LIPITOR) 40 MG tablet Take 1 tablet (40 mg total) by mouth daily.   Calcium Carbonate-Vit D-Min (CALCIUM 600+D3 PLUS  MINERALS) 600-800 MG-UNIT TABS Take 1 tablet by mouth 3 (three) times daily.   clopidogrel (PLAVIX) 75 MG tablet TAKE 1 TABLET BY MOUTH EVERY DAY   dapagliflozin propanediol (FARXIGA) 10 MG TABS tablet Take by mouth daily.   ELIQUIS 5 MG TABS tablet Take 1 tablet (5 mg total) by mouth 2 (two) times daily.   ENTRESTO 24-26 MG Take 0.5 tablets by mouth 2 (two) times daily.   furosemide (LASIX) 40 MG tablet Take 80 mg by mouth daily.   glipiZIDE (GLUCOTROL) 5 MG tablet Take 5 mg by mouth 2 (two) times daily before a meal.   magnesium oxide (MAG-OX) 400 MG tablet Take 400 mg by mouth 2 (two) times daily.   metFORMIN (GLUCOPHAGE) 500 MG tablet Take 1,000 mg by mouth 2 (two) times daily.   metoprolol succinate (TOPROL-XL) 100 MG 24 hr tablet Take 150 mg by mouth daily. Take 1 tablet (100mg ) by mouth daily and 0.5 tablet (50mg ) nightly   nitroGLYCERIN (NITROSTAT) 0.4 MG SL tablet Place 0.4 mg under the tongue as needed.   oxyCODONE-acetaminophen (PERCOCET/ROXICET) 5-325 MG tablet Take 1-2 tablets by mouth every 6 (six) hours as needed for  moderate pain.   PROGRAF 0.5 MG capsule Take 0.5 mg by mouth 2 (two) times daily.   spironolactone (ALDACTONE) 25 MG tablet Take 12.5 mg by mouth daily.    Past Medical History:  Diagnosis Date   AICD (automatic cardioverter/defibrillator) present    Diabetes mellitus without complication (HCC)    Hypertension    Myocardial infarction [facility])    x2 2005, 2006    Past Surgical History:  Procedure Laterality Date   ABLATION  2023   CARDIAC CATHETERIZATION     CAROTID PTA/STENT INTERVENTION Left 04/05/2021   Procedure: CAROTID PTA/STENT INTERVENTION;  Surgeon: Annice Needy, MD;  Location: [facility] INVASIVE CV LAB;  Service: Cardiovascular;  Laterality: Left;   CHOLECYSTECTOMY     CORONARY ANGIOPLASTY     liver transplannt     LOWER EXTREMITY ANGIOGRAPHY Right 06/20/2022   Procedure: Lower Extremity Angiography;  Surgeon: Annice Needy, MD;  Location: [facility] INVASIVE  CV LAB;  Service: Cardiovascular;  Laterality: Right;   LOWER EXTREMITY ANGIOGRAPHY Right 07/25/2022   Procedure: Lower Extremity Angiography;  Surgeon: Annice Needy, MD;  Location: [facility] INVASIVE CV LAB;  Service: Cardiovascular;  Laterality: Right;   LOWER EXTREMITY INTERVENTION Right 07/26/2022   Procedure: LOWER EXTREMITY INTERVENTION;  Surgeon: Annice Needy, MD;  Location: [facility] INVASIVE CV LAB;  Service: Cardiovascular;  Laterality: Right;    Social History Social History   Tobacco Use   Smoking status: Some Days    Types: Cigars   Smokeless tobacco: Never   Tobacco comments:    Occasional cigar when I play golf. 01/02/23 DJM  Vaping Use   Vaping status: Never Used  Substance Use Topics   Alcohol use: Not Currently   Drug use: Never    Family History Family History  Family history unknown: Yes    Allergies  Allergen Reactions   Levaquin [Levofloxacin] Anaphylaxis     REVIEW OF SYSTEMS (Negative unless checked)  Constitutional: [] Weight loss  [] Fever  [] Chills Cardiac: [] Chest pain   [] Chest pressure   [] Palpitations   [] Shortness of breath when laying flat   [] Shortness of breath with exertion. Vascular:  [x] Pain in legs with walking   [] Pain in legs at rest  [] History of DVT   [] Phlebitis   [] Swelling in legs   [] Varicose veins   [] Non-healing ulcers Pulmonary:   [] Uses home oxygen   [] Productive cough   [] Hemoptysis   [] Wheeze  [] COPD   [] Asthma Neurologic:  [] Dizziness   [] Seizures   [] History of stroke   [] History of TIA  [] Aphasia   [] Vissual changes   [] Weakness or numbness in arm   [] Weakness or numbness in leg Musculoskeletal:   [] Joint swelling   [] Joint pain   [] Low back pain Hematologic:  [] Easy bruising  [] Easy bleeding   [] Hypercoagulable state   [] Anemic Gastrointestinal:  [] Diarrhea   [] Vomiting  [] Gastroesophageal reflux/heartburn   [] Difficulty swallowing. Genitourinary:  [] Chronic kidney disease   [] Difficult urination  [] Frequent urination   [] Blood in  urine Skin:  [] Rashes   [] Ulcers  Psychological:  [] History of anxiety   []  History of major depression.  Physical Examination  Vitals:   01/16/23 1359 01/16/23 1433 01/16/23 1607 01/16/23 2000  BP: 112/60 112/60 (!) 84/52 (!) 98/53  Pulse: 86 85 90 89  Resp: 11 16 17 18   Temp: 97.9 F (36.6 C) 97.7 F (36.5 C) 98.1 F (36.7 C) 98.4 F (36.9 C)  TempSrc:    Oral  SpO2: 98% 100% 97% 92%  Weight:      Height:       Body mass index is 23.53 kg/m. Gen: WD/WN, NAD Head: [place]/AT, No temporalis wasting.  Ear/Nose/Throat: Hearing grossly intact, nares w/o erythema or drainage Eyes: PER, EOMI, sclera nonicteric.  Neck: Supple, no masses.  No bruit or JVD.  Pulmonary:  Good air movement, no audible wheezing, no use of accessory muscles.  Cardiac: RRR, normal S1, S2, no Murmurs. Vascular: Severe trophic changes, right foot with a bulky bandage postop there is a 2+ palpable popliteal pulse on the right Radial Palpable Palpable  PT Bandage present Not Palpable  DP Bandage present Not Palpable  Gastrointestinal: soft, non-distended. No guarding/no peritoneal signs.  Musculoskeletal: M/S 5/5 throughout.  No visible deformity.  Neurologic: CN 2-12 intact. Pain and light touch intact in extremities.  Symmetrical.  Speech is fluent. Motor exam as listed above. Psychiatric: Judgment intact, Mood & affect appropriate for pt's clinical situation. Dermatologic: No rashes or ulcers noted.  No changes consistent with cellulitis.   CBC Lab Results  Component Value Date   WBC 9.3 01/16/2023   HGB 10.3 (L) 01/16/2023   HCT 30.6 (L) 01/16/2023   MCV 79.3 (L) 01/16/2023   PLT 269 01/16/2023    BMET    Component Value Date/Time   NA 134 (L) 01/15/2023 0435   K 3.6 01/15/2023 0435   CL 99 01/15/2023 0435   CO2 25 01/15/2023 0435   GLUCOSE 138 (H) 01/15/2023 0435   BUN 35 (H) 01/15/2023 0435   CREATININE 1.53 (H) 01/16/2023 0819   CALCIUM 8.7 (L) 01/15/2023 0435   GFRNONAA 47 (L)  01/16/2023 0819   Estimated Creatinine Clearance: 41.7 mL/min (A) (by C-G formula based on SCr of 1.53 mg/dL (H)).  COAG Lab Results  Component Value Date   INR 1.6 (H) 01/14/2023   INR 1.0 03/23/2021    Radiology DG Foot Complete Right  Result Date: 01/16/2023 CLINICAL DATA:  Status post fifth toe amputation, initial encounter EXAM: RIGHT FOOT COMPLETE - 3+ VIEW COMPARISON:  MRI from the previous day. FINDINGS: There is been interval amputation of the fifth metatarsal near the base. No residual all erosive changes are noted. No soft tissue abnormality is seen. IMPRESSION: Status post fifth digit and metatarsal amputation. Electronically Signed   By: Alcide Clever M.D.   On: 01/16/2023 17:01   US ARTERIAL ABI (SCREENING LOWER EXTREMITY)  Result Date: 01/16/2023 CLINICAL DATA:  Diabetic foot ulcer EXAM: NONINVASIVE PHYSIOLOGIC VASCULAR STUDY OF BILATERAL LOWER EXTREMITIES TECHNIQUE: Evaluation of both lower extremities were performed at rest, including calculation of ankle-brachial indices with single level pressure measurements and doppler recording. COMPARISON:  None available. FINDINGS: Right ABI:  0.73 Left ABI:  0.59 Right Lower Extremity: Dorsalis pedis and posterior tibial artery waveforms are monophasic. Left Lower Extremity: Dorsalis pedis and posterior tibial artery waveforms are monophasic. 0.5-0.79 Moderate PAD IMPRESSION: Moderate bilateral lower extremity peripheral arterial disease. Electronically Signed   By: Acquanetta Belling M.D.   On: 01/16/2023 08:13   US RENAL  Result Date: 01/15/2023 CLINICAL DATA:  Acute kidney injury EXAM: RENAL / URINARY TRACT ULTRASOUND COMPLETE COMPARISON:  CT abdomen and pelvis 08/18/2005 FINDINGS: Right Kidney: Renal measurements: 10.9 x 5.7 x 4.4 cm = volume: 144 mL. Echogenicity within normal limits. No mass or hydronephrosis visualized. Left Kidney: Renal measurements: 10.1 x 5.6 x 4.8 cm = volume: 144 mL. Echogenicity within normal limits. No mass  or hydronephrosis visualized. Bladder: Appears normal for degree of bladder distention. Other: None.  IMPRESSION: Normal ultrasound appearance of the kidneys. No hydronephrosis. Bladder is unremarkable. Electronically Signed   By: Burman Nieves M.D.   On: 01/15/2023 21:59   MR FOOT RIGHT WO CONTRAST  Result Date: 01/15/2023 CLINICAL DATA:  Lateral foot ulcer. EXAM: MRI OF THE RIGHT FOREFOOT WITHOUT CONTRAST TECHNIQUE: Multiplanar, multisequence MR imaging of the right forefoot was performed. No intravenous contrast was administered. COMPARISON:  None Available. FINDINGS: Bones/Joint/Cartilage Bony destruction and pathologic fracture of the fifth metatarsal head. Prominent marrow edema involving the proximal half of the fifth proximal phalanx. Large fifth MTP joint effusion. Fifth MTP joint subluxation. Ligaments Fifth MTP joint collateral ligaments are torn. Remaining toe collateral ligaments are intact. Lisfranc ligament is intact. Muscles and Tendons Flexor and extensor tendons are intact. No tenosynovitis. Increased T2 signal within the intrinsic muscles of the forefoot, nonspecific, but likely related to diabetic muscle changes. Soft tissue Lateral forefoot ulceration adjacent to the fifth MTP joint extending to bone. Small 6 x 9 x 8 mm fluid collection superior to the fifth MTP joint. No soft tissue mass. IMPRESSION: 1. Lateral forefoot ulceration adjacent to the fifth MTP joint extending to bone. 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 Fifth MTP joint septic arthritis. Small subcentimeter abscess superior to the fifth MTP joint. Electronically Signed   By: Obie Dredge M.D.   On: 01/15/2023 12:56   DG Chest 2 View  Result Date: 01/14/2023 CLINICAL DATA:  History of chronic heart failure with ICD in-situ EXAM: CHEST - 2 VIEW COMPARISON:  Chest radiograph dated 07/11/2004 FINDINGS: Lines/tubes: Left chest wall ICD leads project over the right atrium and ventricle. Lungs:  Well inflated lungs. No focal consolidation. Pleura: No pneumothorax or pleural effusion. Heart/mediastinum: The heart size and mediastinal contours are within normal limits. Bones: No acute osseous abnormality. IMPRESSION: Left chest wall ICD leads project over the right atrium and ventricle. No pneumothorax. Electronically Signed   By: Agustin Cree M.D.   On: 01/14/2023 17:52     Assessment/Plan 1.  Atherosclerotic occlusive disease bilateral lower extremities with ulceration of the right fifth ray status post amputation:  Recommend:  The patient has evidence of severe atherosclerotic changes of both lower extremities associated with ulceration and tissue loss of the right foot.  This represents a limb threatening ischemia and places the patient at the risk for right limb loss.  Patient should undergo angiography of the right lower extremity with the hope for intervention for limb salvage.  The risks and benefits as well as the alternative therapies was discussed in detail with the patient.  All questions were answered.  Patient agrees to proceed with right angiography.  The patient will follow up in the office after the procedure.   2. Essential hypertension Continue antihypertensive medications as already ordered, these medications have been reviewed and there are no changes at this time.   3. Type 2 diabetes mellitus without complication, without long-term current use of insulin (HCC) Continue hypoglycemic medications as already ordered, these medications have been reviewed and there are no changes at this time.   Hgb A1C to be monitored as already arranged by primary service.  4.  CAD: Continue cardiac and antihypertensive medications as already ordered and reviewed, no changes at this time.  Continue statin as ordered and reviewed, no changes at this time  Nitrates PRN for chest pain.  5.  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   Antibiotics per podiatry and medicine.  Levora Dredge, MD  01/16/2023 9:16 PM

## 2023-01-17 ENCOUNTER — Encounter (INDEPENDENT_AMBULATORY_CARE_PROVIDER_SITE_OTHER): Payer: Self-pay | Admitting: Vascular Surgery

## 2023-01-17 ENCOUNTER — Encounter
Admission: EM | Disposition: A | Discharge status: Home / Self Care | Payer: Self-pay | Source: Home / Self Care | Attending: Internal Medicine

## 2023-01-17 ENCOUNTER — Encounter: Payer: Self-pay | Admitting: Podiatry

## 2023-01-17 DIAGNOSIS — I70202 Unspecified atherosclerosis of native arteries of extremities, left leg: Secondary | ICD-10-CM | POA: Diagnosis not present

## 2023-01-17 DIAGNOSIS — M869 Osteomyelitis, unspecified: Secondary | ICD-10-CM | POA: Diagnosis not present

## 2023-01-17 DIAGNOSIS — L97519 Non-pressure chronic ulcer of other part of right foot with unspecified severity: Secondary | ICD-10-CM | POA: Diagnosis not present

## 2023-01-17 DIAGNOSIS — M86171 Other acute osteomyelitis, right ankle and foot: Secondary | ICD-10-CM | POA: Diagnosis not present

## 2023-01-17 DIAGNOSIS — I70235 Atherosclerosis of native arteries of right leg with ulceration of other part of foot: Secondary | ICD-10-CM | POA: Diagnosis not present

## 2023-01-17 DIAGNOSIS — Z95828 Presence of other vascular implants and grafts: Secondary | ICD-10-CM

## 2023-01-17 DIAGNOSIS — Z9889 Other specified postprocedural states: Secondary | ICD-10-CM | POA: Diagnosis not present

## 2023-01-17 HISTORY — PX: LOWER EXTREMITY ANGIOGRAPHY: CATH118251

## 2023-01-17 LAB — CBC
HCT: 30.5 % — ABNORMAL LOW (ref 39.0–52.0)
Hemoglobin: 10.1 g/dL — ABNORMAL LOW (ref 13.0–17.0)
MCH: 26.6 pg (ref 26.0–34.0)
MCHC: 33.1 g/dL (ref 30.0–36.0)
MCV: 80.3 fL (ref 80.0–100.0)
Platelets: 258 10*3/uL (ref 150–400)
RBC: 3.8 MIL/uL — ABNORMAL LOW (ref 4.22–5.81)
RDW: 20.3 % — ABNORMAL HIGH (ref 11.5–15.5)
WBC: 9.7 10*3/uL (ref 4.0–10.5)
nRBC: 0 % (ref 0.0–0.2)

## 2023-01-17 LAB — GLUCOSE, CAPILLARY
Glucose-Capillary: 129 mg/dL — ABNORMAL HIGH (ref 70–99)
Glucose-Capillary: 123 mg/dL — ABNORMAL HIGH (ref 70–99)
Glucose-Capillary: 166 mg/dL — ABNORMAL HIGH (ref 70–99)
Glucose-Capillary: 172 mg/dL — ABNORMAL HIGH (ref 70–99)
Glucose-Capillary: 204 mg/dL — ABNORMAL HIGH (ref 70–99)

## 2023-01-17 LAB — CREATININE, SERUM
Creatinine, Ser: 1.31 mg/dL — ABNORMAL HIGH (ref 0.61–1.24)
GFR, Estimated: 56 mL/min — ABNORMAL LOW (ref >60)

## 2023-01-17 LAB — HEPARIN LEVEL (UNFRACTIONATED)
Heparin Unfractionated: 0.47 [IU]/mL (ref 0.30–0.70)
Heparin Unfractionated: 0.39 [IU]/mL (ref 0.30–0.70)

## 2023-01-17 LAB — APTT: aPTT: 64 s — ABNORMAL HIGH (ref 24–36)

## 2023-01-17 LAB — TACROLIMUS LEVEL: Tacrolimus (FK506) - LabCorp: 3.1 ng/mL (ref 2.0–20.0)

## 2023-01-17 SURGERY — LOWER EXTREMITY ANGIOGRAPHY
Anesthesia: Moderate Sedation | Laterality: Right

## 2023-01-17 MED ORDER — ASPIRIN 325 MG PO TABS
325.0000 mg | ORAL_TABLET | Freq: Once | ORAL | Status: AC
  Administered 2023-01-17: 325 mg via ORAL
  Filled 2023-01-17: qty 1

## 2023-01-17 MED ORDER — FENTANYL CITRATE (PF) 100 MCG/2ML IJ SOLN
INTRAMUSCULAR | Status: AC
  Filled 2023-01-17: qty 2

## 2023-01-17 MED ORDER — MIDAZOLAM HCL 5 MG/5ML IJ SOLN
INTRAMUSCULAR | Status: AC
  Filled 2023-01-17: qty 5

## 2023-01-17 MED ORDER — IODIXANOL 320 MG/ML IV SOLN
INTRAVENOUS | Status: DC | PRN
  Administered 2023-01-17: 85 mL

## 2023-01-17 MED ORDER — SODIUM CHLORIDE 0.9% FLUSH
3.0000 mL | Freq: Two times a day (BID) | INTRAVENOUS | Status: DC
  Administered 2023-01-17 – 2023-01-18 (×2): 3 mL via INTRAVENOUS

## 2023-01-17 MED ORDER — HEPARIN SODIUM (PORCINE) 1000 UNIT/ML IJ SOLN
INTRAMUSCULAR | Status: AC
Start: 2023-01-17 — End: ?
  Filled 2023-01-17: qty 10

## 2023-01-17 MED ORDER — SODIUM CHLORIDE 0.9% FLUSH: 3.0000 mL | INTRAVENOUS | Status: DC | PRN

## 2023-01-17 MED ORDER — SODIUM CHLORIDE 0.9 % IV SOLN: INTRAVENOUS | Status: AC

## 2023-01-17 MED ORDER — MIDAZOLAM HCL 2 MG/2ML IJ SOLN
INTRAMUSCULAR | Status: DC | PRN
  Administered 2023-01-17: 2 mg via INTRAVENOUS

## 2023-01-17 MED ORDER — HEPARIN (PORCINE) IN NACL 1000-0.9 UT/500ML-% IV SOLN
INTRAVENOUS | Status: DC | PRN
  Administered 2023-01-17: 1000 mL

## 2023-01-17 MED ORDER — LIDOCAINE HCL (PF) 1 % IJ SOLN
INTRAMUSCULAR | Status: DC | PRN
  Administered 2023-01-17: 10 mL

## 2023-01-17 MED ORDER — FENTANYL CITRATE (PF) 100 MCG/2ML IJ SOLN
INTRAMUSCULAR | Status: DC | PRN
  Administered 2023-01-17: 50 ug via INTRAVENOUS

## 2023-01-17 MED ORDER — SODIUM CHLORIDE 0.9 % IV SOLN: INTRAVENOUS | Status: AC | PRN

## 2023-01-17 MED ORDER — HEPARIN (PORCINE) 25000 UT/250ML-% IV SOLN
1150.0000 [IU]/h | INTRAVENOUS | Status: DC
  Administered 2023-01-17: 1150 [IU]/h via INTRAVENOUS
  Filled 2023-01-17: qty 250

## 2023-01-17 MED ORDER — SODIUM CHLORIDE 0.9 % IV BOLUS
INTRAVENOUS | Status: DC | PRN
  Administered 2023-01-17 (×2): 200 mL via INTRAVENOUS

## 2023-01-17 MED ORDER — SODIUM CHLORIDE 0.9 % IV SOLN: 250.0000 mL | INTRAVENOUS | Status: DC | PRN

## 2023-01-17 MED ORDER — ASPIRIN 325 MG PO TBEC
DELAYED_RELEASE_TABLET | ORAL | Status: AC
  Filled 2023-01-17: qty 1

## 2023-01-17 MED ORDER — HEPARIN SODIUM (PORCINE) 1000 UNIT/ML IJ SOLN
INTRAMUSCULAR | Status: DC | PRN
  Administered 2023-01-17: 6000 [IU] via INTRAVENOUS

## 2023-01-17 SURGICAL SUPPLY — 27 items
BALLN LUTONIX 018 4X40X130 (BALLOONS) ×1
BALLN LUTONIX DCB 6X40X130 (BALLOONS) ×1
BALLN ULTRASCOR 014 2.5X40X150 (BALLOONS) ×1
BALLN ULTRASCORE 014 2X100X150 (BALLOONS) ×1
BALLOON LUTONIX 018 4X40X130 (BALLOONS) IMPLANT
BALLOON LUTONIX DCB 6X40X130 (BALLOONS) IMPLANT
BALLOON ULTRSCR 014 2.5X40X150 (BALLOONS) IMPLANT
BALLOON ULTRSCRE 014 2X100X150 (BALLOONS) IMPLANT
CATH ANGIO 5F PIGTAIL 65CM (CATHETERS) IMPLANT
CATH VERT 5FR 125CM (CATHETERS) IMPLANT
COVER PROBE ULTRASOUND 5X96 (MISCELLANEOUS) IMPLANT
DEVICE STARCLOSE SE CLOSURE (Vascular Products) IMPLANT
GLIDEWIRE ADV .035X260CM (WIRE) IMPLANT
GOWN STRL REUS W/ TWL LRG LVL3 (GOWN DISPOSABLE) ×1 IMPLANT
GOWN STRL REUS W/TWL LRG LVL3 (GOWN DISPOSABLE) ×1
KIT ENCORE 26 ADVANTAGE (KITS) IMPLANT
NDL ENTRY 21GA 7CM ECHOTIP (NEEDLE) IMPLANT
NEEDLE ENTRY 21GA 7CM ECHOTIP (NEEDLE) ×1 IMPLANT
PACK ANGIOGRAPHY (CUSTOM PROCEDURE TRAY) ×1 IMPLANT
SET INTRO CAPELLA COAXIAL (SET/KITS/TRAYS/PACK) IMPLANT
SHEATH BRITE TIP 5FRX11 (SHEATH) IMPLANT
SHEATH RAABE 6FRX70 (SHEATH) IMPLANT
STENT LIFESTAR 7X40X80 (Permanent Stent) IMPLANT
SYR MEDRAD MARK 7 150ML (SYRINGE) IMPLANT
TUBING CONTRAST HIGH PRESS 72 (TUBING) IMPLANT
WIRE GUIDERIGHT .035X150 (WIRE) IMPLANT
WIRE RUNTHROUGH .014X300CM (WIRE) IMPLANT

## 2023-01-17 NOTE — Progress Notes (Signed)
PHARMACY - ANTICOAGULATION CONSULT NOTE  Pharmacy Consult for IV heparin Indication: atrial fibrillation  Allergies  Allergen Reactions   Levaquin [Levofloxacin] Anaphylaxis    Patient Measurements: Height: 5\' 10"  (177.8 cm) Weight: 74.4 kg (164 lb) IBW/kg (Calculated) : 73 Heparin Dosing Weight: 74.4 kg  Vital Signs: Temp: 97.7 F (36.5 C) (10/25 0209) Temp Source: Oral (10/25 0209) BP: 105/58 (10/25 0209) Pulse Rate: 80 (10/25 0209)  Labs: Recent Labs    01/14/23 0948 01/14/23 0948 01/14/23 1422 01/15/23 0435 01/15/23 1456 01/16/23 0011 01/16/23 0819 01/17/23 0543  HGB 11.6*  --   --  10.0*  --   --  10.3* 10.1*  HCT 36.0*  --   --  30.4*  --   --  30.6* 30.5*  PLT 289  --   --  255  --   --  269 258  APTT  --    < > 28 55*   < > 67* 82* 64*  LABPROT  --   --  19.3*  --   --   --   --   --   INR  --   --  1.6*  --   --   --   --   --   HEPARINUNFRC  --    < > >1.10* >1.10*  --   --  0.82* 0.47  CREATININE 1.73*  --   --  1.47*  --   --  1.53*  --    < > = values in this interval not displayed.    Estimated Creatinine Clearance: 41.7 mL/min (A) (by C-G formula based on SCr of 1.53 mg/dL (H)).   Medical History: Past Medical History:  Diagnosis Date   AICD (automatic cardioverter/defibrillator) present    Diabetes mellitus without complication (HCC)    Hypertension    Myocardial infarction [facility])    x2 2005, 2006    Medications:  Eliquis last dose 10/22 AM  Assessment: 77 year old male admitted with diabetic ulcer infection. PMH includes Afib on Eliquis. Last dose of Eliquis 10/22 AM.  Goal of Therapy:  Heparin level 0.3-0.7 units/ml Monitor platelets by anticoagulation protocol: Yes   Plan: aPTT  therapeutic X 3. Heparin level and aPTT now correlating ---Will continue heparin at 1150 units/hr ---Will transition to dosing via heparin levels.  ---Next heparin level tomorrow AM ---Continue to monitor H&H and platelets   Elliot Gurney,  PharmD, BCPS Clinical Pharmacist  01/17/2023 7:05 AM

## 2023-01-17 NOTE — TOC Initial Note (Signed)
Transition of Care [facility]) - Initial/Assessment Note    Patient Details  Name: Dean King MRN: [id number] Date of Birth: 1945/04/02  [facility]) CM/SW Contact:    Chapman Fitch, RN Phone Number: 01/17/2023, 4:04 PM  Clinical Narrative:                  Per MD IV antibiotics will not be indicated at discharge Discussed recommendations for home health PT with patient.  Patient declines. He is aware he can reach out to his PCP if he changes his mind.  MD updated. No DME needs per PT       Patient Goals and CMS Choice            Expected Discharge Plan and Services                                              Prior Living Arrangements/Services                       Activities of Daily Living   ADL Screening (condition at time of admission) Independently performs ADLs?: Yes (appropriate for developmental age) Is the patient deaf or have difficulty hearing?: No Does the patient have difficulty seeing, even when wearing glasses/contacts?: No Does the patient have difficulty concentrating, remembering, or making decisions?: No  Permission Sought/Granted                  Emotional Assessment              Admission diagnosis:  Osteomyelitis (HCC) [M86.9] Cellulitis of right lower extremity [L03.115] AKI (acute kidney injury) (HCC) [N17.9] Ulcer of right foot, unspecified ulcer stage (HCC) [L97.519] Patient Active Problem List   Diagnosis Date Noted   Acute osteomyelitis of metatarsal bone of right foot (HCC) 01/16/2023   Cellulitis of right lower extremity 01/15/2023   Osteomyelitis (HCC) 01/14/2023   Atherosclerosis of native arteries of extremity with intermittent claudication (HCC) 08/13/2022   Ischemic leg 07/26/2022   Heart failure with reduced ejection fraction (HCC) 08/29/2021   New onset atrial fibrillation (HCC) 04/24/2021   Carotid stenosis, symptomatic, with infarction (HCC) 04/03/2021   Cardiomyopathy (HCC)     TIA (transient ischemic attack) 03/23/2021   CAD (coronary artery disease) 03/23/2021   Essential hypertension 03/23/2021   HLD (hyperlipidemia) 03/23/2021   Liver transplant recipient [facility]) 03/23/2021   Diabetes mellitus type 2 in nonobese (HCC) 03/23/2021   Diabetes mellitus (HCC) 12/27/2010   Coronary atherosclerosis 12/27/2010   Hypertension, benign 12/27/2010   Mixed hyperlipidemia 12/27/2010   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 [place] [address] Phone: [phone] Fax: [phone]  [facility] [place], [place] - [address] [facility] AT Portal to Registered [facility] Sites [address] [place] [address] Phone: [phone] Fax: [phone]  [facility] [address] [place], [place] - [address] [facility] [address] AT [place] [address] [place] [address] Phone: [phone] Fax: [phone]     Social Determinants of Health (SDOH) Social History: SDOH Screenings   Food Insecurity: No Food Insecurity (01/14/2023)  Housing: Low  Risk  (01/14/2023)  Transportation Needs: No Transportation Needs (01/14/2023)  Utilities: Not At Risk (01/14/2023)  Financial Resource Strain: Low Risk  (07/05/2022)   [facility]  Tobacco Use: High Risk (01/17/2023)   SDOH Interventions:     Readmission Risk Interventions     No data to display

## 2023-01-17 NOTE — Evaluation (Signed)
Physical Therapy Evaluation Patient Details Name: Dean King MRN: [id number] DOB: Aug 12, 1945 Today's Date: 01/17/2023  History of Present Illness  Pt is a 77 y.o. 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 Fifth ray resection on 01/16/23.  Clinical Impression  Pt is received in bed with spouse, Dean King, at bedside, he is agreeable to PT with encouragement. At baseline, Pt reports indep with ADL/IADLs, recent use of crutches for amb, and physically active with daily walks and playing golf. Pt performs bed mobility mod I, transfers and amb close supA for safety. Pt able to perform 2 STS with and without AD and adhering to [facility] precautions on RLE; encouraged Pt to perform OOB with RW at all times to ensure PWB adherence. Additionally, Pt able to amb approx 5 ft using RW and maintain precautions without difficulty or reports of increased pain; unable to assess further amb distance at this time due to RLE discomfort/pain. Educated Pt on precautions per MD orders and how to properly don/doff shoe-Pt verbalized understanding. Pt would benefit from skilled PT to address above deficits and promote optimal return to PLOF.      If plan is discharge home, recommend the following: A little help with walking and/or transfers;Help with stairs or ramp for entrance;Assist for transportation   Can travel by private vehicle        Equipment Recommendations None recommended by PT  Recommendations for Other Services       Functional Status Assessment Patient has had a recent decline in their functional status and demonstrates the ability to make significant improvements in function in a reasonable and predictable amount of time.     Precautions / Restrictions Precautions Precautions: None Required Braces or Orthoses: Other Brace Other Brace: darco wedge shoe Restrictions Weight Bearing  Restrictions: Yes RLE Weight Bearing: Partial weight bearing      Mobility  Bed Mobility Overal bed mobility: Modified Independent             General bed mobility comments: Pt able to perform bed mobility mod I with use of railing; no cuing required    Transfers Overall transfer level: Needs assistance Equipment used: Rolling walker (2 wheels) Transfers: Sit to/from Stand Sit to Stand: Supervision           General transfer comment: 1x STS without AD close supA but heavy leaning on RLE; x1 STS with RW supA with steady stance and adherence of PWB precautions on RLE    Ambulation/Gait Ambulation/Gait assistance: Supervision Gait Distance (Feet): 5 Feet Assistive device: Rolling walker (2 wheels) Gait Pattern/deviations: Step-to pattern, Decreased step length - left, Decreased stance time - right, Decreased stride length Gait velocity: Decreased     General Gait Details: able to amb approx 5 ft using RW and adhering to [facility] precautions but limited amb distance due to discomfort/pain  Stairs            Wheelchair Mobility     Tilt Bed    Modified Rankin (Stroke Patients Only)       Balance Overall balance assessment: Modified Independent                                           Pertinent Vitals/Pain Pain Assessment Pain Assessment: 0-10 Pain Score: 5  Pain Location:  L foot Pain Descriptors / Indicators: Aching, Throbbing, Operative site guarding, Discomfort Pain Intervention(s): Limited activity within patient's tolerance, Monitored during session, Repositioned    Home Living Family/patient expects to be discharged to:: Private residence Living Arrangements: Spouse/significant other Available Help at Discharge: Family;Available 24 hours/day Type of Home: House Home Access: Stairs to enter Entrance Stairs-Rails: None (Use wall or surrounding furniture) Entrance Stairs-Number of Steps: 5 STE main door, 2 low threshold through  patio   Home Layout: One level Home Equipment: Agricultural consultant (2 wheels);Cane - single point;Cane - quad;Crutches      Prior Function Prior Level of Function : Independent/Modified Independent;Driving             Mobility Comments: Pt reports use of crutches past few days but prior able to amb without AD approx 3.9 miles a day, every day ADLs Comments: Independent     Extremity/Trunk Assessment   Upper Extremity Assessment Upper Extremity Assessment: Overall WFL for tasks assessed    Lower Extremity Assessment Lower Extremity Assessment: Generalized weakness       Communication   Communication Communication: No apparent difficulties Cueing Techniques: Verbal cues  Cognition Arousal: Alert Behavior During Therapy: WFL for tasks assessed/performed Overall Cognitive Status: Within Functional Limits for tasks assessed                                 General Comments: AO x4; Pleasant and slightly apprehensive to work with PT at first but with encouragement changed decision        General Comments General comments (skin integrity, edema, etc.): Pt able to maintain seated and standing static/dynamic balance without LOB noted    Exercises Other Exercises Other Exercises: Educated Pt on donning/doffing of darco wedge boot and PWB precautions per MD   Assessment/Plan    PT Assessment Patient needs continued PT services  PT Problem List Decreased strength;Decreased mobility;Decreased range of motion;Decreased activity tolerance;Decreased cognition;Decreased safety awareness;Pain       PT Treatment Interventions DME instruction;Therapeutic exercise;Gait training;Stair training;Functional mobility training;Therapeutic activities    PT Goals (Current goals can be found in the Care Plan section)  Acute Rehab PT Goals Patient Stated Goal: to go home PT Goal Formulation: With patient Time For Goal Achievement: 01/31/23 Potential to Achieve Goals: Good     Frequency Min 1X/week     Co-evaluation               AM-PAC PT "6 Clicks" Mobility  Outcome Measure Help needed turning from your back to your side while in a flat bed without using bedrails?: None Help needed moving from lying on your back to sitting on the side of a flat bed without using bedrails?: None Help needed moving to and from a bed to a chair (including a wheelchair)?: None Help needed standing up from a chair using your arms (e.g., wheelchair or bedside chair)?: None Help needed to walk in hospital room?: A Little Help needed climbing 3-5 steps with a railing? : A Little 6 Click Score: 22    End of Session Equipment Utilized During Treatment: Other (comment) (darco wedge boot) Activity Tolerance: Patient tolerated treatment well Patient left: in bed;with call bell/phone within reach;with family/visitor present Nurse Communication: Mobility status PT Visit Diagnosis: Unsteadiness on feet (R26.81);Muscle weakness (generalized) (M62.81);Pain Pain - Right/Left: Right Pain - part of body: Ankle and joints of foot    Time: 1308-6578 PT Time Calculation (min) (ACUTE ONLY): 15  min   Charges:                 Elmon Else, SPT   Lazaro Isenhower 01/17/2023, 11:58 AM

## 2023-01-17 NOTE — Plan of Care (Signed)
  Problem: Coping: Goal: Ability to adjust to condition or change in health will improve Outcome: Progressing   

## 2023-01-17 NOTE — Progress Notes (Signed)
Pharmacy Antibiotic Note  Dean King is a 77 y.o. male w/ PMH of CHF, on AICD, liver transplant, PAF on Eliquis, PVD, IIDM, chronic right foot ulcer, PVD  admitted on 01/14/2023 with  osteomyelitis .  Pharmacy has been consulted for vancomycin and cefepime dosing. Serum creatinine elevated from baseline but improving. Received amputation on 01/16/23.  Plan: 1) continue cefepime 2 grams IV every 12 hours ---follow renal function for needed dose adjustments  2) continue vancomycin 1000 mg IV every 24 hours Goal AUC 400-550. Expected AUC: 416 SCr used: 1.47 mg/dL  Follow up podiatry plans.  Height: 5\' 10"  (177.8 cm) Weight: 74.4 kg (164 lb) IBW/kg (Calculated) : 73  Temp (24hrs), Avg:97.7 F (36.5 C), Min:96.8 F (36 C), Max:98.4 F (36.9 C)  Recent Labs  Lab 01/14/23 0948 01/15/23 0435 01/16/23 0819 01/17/23 0543  WBC 12.8* 9.8 9.3 9.7  CREATININE 1.73* 1.47* 1.53*  --     Estimated Creatinine Clearance: 41.7 mL/min (A) (by C-G formula based on SCr of 1.53 mg/dL (H)).    Allergies  Allergen Reactions   Levaquin [Levofloxacin] Anaphylaxis    Antimicrobials this admission: cefepime 10/22 >>  vancomycin 10/22 >>   Microbiology results: Amputation from right toe: no organisms  Thank you for allowing pharmacy to be a part of this patient's care.  Elliot Gurney, PharmD, BCPS Clinical Pharmacist  01/17/2023 7:08 AM

## 2023-01-17 NOTE — Op Note (Signed)
[facility]  Percutaneous Study/Intervention Procedural Note   Date of Surgery: 01/17/2023  Surgeon:  Levora Dredge  Pre-operative Diagnosis: Atherosclerotic occlusive disease bilateral lower extremities with ulceration and osteomyelitis of the right lower extremity  Post-operative diagnosis:  Same  Procedure(s) Performed:             1.  Introduction catheter into right lower extremity 3rd order catheter placement               2.  Contrast injection right lower extremity for distal runoff with additional 3rd order             3.  Percutaneous transluminal angioplasty right posterior tibial and 3 locations                          4.  Percutaneous transluminal angioplasty and stent placement right external iliac artery             5.  Star close closure left common femoral arteriotomy  Anesthesia: Conscious sedation was administered under my direct supervision by the interventional radiology RN. IV Versed plus fentanyl were utilized. Continuous ECG, pulse oximetry and blood pressure was monitored throughout the entire procedure.  Conscious sedation was for a total of 1 hour 2 minutes and 35 seconds.  Sheath: 80 cm 6 [place] Rabie sheath left common femoral retrograde  Contrast: 85 cc  Fluoroscopy Time: 8.8 minutes  Indications:  Dean King presents with increasing pain of the left lower extremity.  He has undergone debridement secondary to osteomyelitis at which time was noted to have very poor bleeding.   This suggests the patient is having limb threatening ischemia. The risks and benefits are reviewed all questions answered patient agrees to proceed.  Procedure: Dean King is a 77 y.o. y.o. male who was identified and appropriate procedural time out was performed.  The patient was then placed supine on the table and prepped and draped in the usual sterile fashion.    Ultrasound was placed in the sterile sleeve and the left groin was evaluated the  left common femoral artery was echolucent and pulsatile indicating patency.  Image was recorded for the permanent record and under real-time visualization a microneedle was inserted into the common femoral artery followed by the microwire and then the micro-sheath.  A J-wire was then advanced through the micro-sheath and a  5 [place] sheath was then inserted over a J-wire. J-wire was then advanced and a 5 French pigtail catheter was positioned at the level of T12.  AP projection of the aorta was then obtained. Pigtail catheter was repositioned to above the bifurcation and a LAO view of the pelvis was obtained.  Subsequently a pigtail catheter with the stiff angle Glidewire was used to cross the aortic bifurcation the catheter wire were advanced down into the right distal external iliac artery. Oblique view of the femoral bifurcation was then obtained and subsequently the wire was reintroduced and the pigtail catheter negotiated into the SFA representing third order catheter placement. Distal runoff was then performed.  6000 units of heparin was then given and allowed to circulate and a 80 cm 6 [place] Rabie sheath was advanced up and over the bifurcation and positioned in the mid superficial femoral artery  A Kumpe catheter and advantage wire were then negotiated down into the distal popliteal. Catheter was then advanced. Hand injection contrast demonstrated the tibial anatomy in detail.  The catheter and wire were  then negotiated into the posterior tibial.  The wire was then exchanged for a 0.014 run-through wire.  A 3 mm x 40 mm ultra score balloon was used to angioplasty the posterior tibial at its origin and then approximately 5 cm distally.  The inflations were for 1 minute each at 12 atm.  I then elected to treat the origin lesion with a 4 mm Lutonix drug-eluting balloon inflated to 4 atm for 2 full minutes.  Follow-up imaging demonstrated excellent patency with less than 10% residual stenosis and  preservation of the distal runoff, again noting the long segment diseased area of the lateral plantar artery.  Since the wire had already been negotiated down to this level I selected a 2 mm x 100 mm ultra score balloon and advanced it across the distalmost aspect of the posterior tibial and the majority of the plantar artery.  Inflation was to 10 atm for approximately 2 minutes.  Follow-up imaging demonstrated marked improvement with less than 20% residual stenosis throughout the segment and rapid filling of the pedal arch.  The sheath was then pulled back into the right common iliac and an oblique view of the right external iliac was performed.  After appropriate measurements a 7 mm x 40 mm life star stent was deployed and then postdilated with a 6 mm x 40 mm Lutonix drug-eluting balloon inflated to 10 atm for 1 minute.  Follow-up imaging demonstrated less than 10% residual stenosis.  After review of these images the sheath is pulled into the left external iliac oblique of the common femoral is obtained and a Star close device deployed. There no immediate Complications.  Findings:  The abdominal aorta is opacified with a bolus injection contrast. Renal arteries are single and patent. The aorta itself has diffuse disease but no hemodynamically significant lesions. The right common iliac artery is widely patent and right external iliac demonstrates a 65 to 70% stenosis in its midportion.  The right common femoral is widely patent as is the profunda femoris.  The SFA and popliteal have been stented in the past and the stents are widely patent.  The trifurcation is diseased with occlusion of the anterior tibial artery, at the very distalmost aspect of the tibioperoneal trunk there is a greater than 90% stenosis which obstructs flow to the peroneal and posterior tibial arteries.   Then approximately 3 to 5 cm distal to this lesion there is a greater than 70% stenosis in the posterior tibial.  Lastly, as  described above in the distalmost aspect of the posterior tibial and the plantar artery there is a diffuse greater than 80% stenosis.  Following angioplasty the right posterior tibial now is now widely patent including the distal plantar branch and demonstrates in-line flow with less than 20% residual stenosis.  It is much improved and looks quite nice. Angioplasty and stent placement of the right external iliac artery also yields an excellent result with less than 10% residual stenosis.  Summary: Successful recanalization right lower extremity for limb salvage                           Disposition: Patient was taken to the recovery room in stable condition having tolerated the procedure well.  Dean King 01/17/2023,2:11 PM

## 2023-01-17 NOTE — Progress Notes (Signed)
Progress Note   Patient: Dean King TFT:[id number] DOB: [birth year] DOA: 01/14/2023     3 DOS: the patient was seen and examined on 01/17/2023   Brief hospital course:  TRAMEL MCCLISH is a 77 y.o. 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   Patient has history of chronic right foot ulcer been taking care of podiatry since March of this year.  Treatment including outpatient wound care center debridement and 2 rounds of p.o. cyclin, the most recent round of p.o. antibiotics completed about 3 to 4 weeks ago.  After that, patient seemed stable dry right foot ulcer.  Last Friday however patient started to develop severe pain associated with the right foot ulcer, last 2 days even putting weight on right foot and gravity triggered significant pain.  Denies any fever chills, no claudication.  Wife does report that the wound started to drain again with thick yellowish pus for the last 3 days.  Today, patient went to see podiatry who sent patient to ED.   ED Course: Afebrile, nontachycardic blood pressure 101/55.  WBC 9.6, hemoglobin 12.8, hemoglobin 11.6, creatinine 1.7 compared to baseline 1.1.   Patient was started on vancomycin and ceftriaxone in the ED.     Assessment and Plan:  Osteomyelitis (HCC) Right foot diabetic ulcer infection PAD Patient presents to the ER for evaluation of severe pain involving a chronic right foot ulcer, difficulty bearing weight on his right foot and purulent drainage MRI of the right foot showed lateral forefoot ulceration adjacent to the fifth MTP joint extending to bone. 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 Fifth MTP joint septic arthritis. Small sub centimeter abscess superior to the fifth MTP joint. Patient is status post partial fifth ray amputation Appreciate vascular surgery input, patient has evidence of severe  atherosclerotic changes of both lower extremities associated with ulceration and tissue loss of the right foot.  This represents a limb threatening ischemia and places the patient at the risk for right limb loss. Patient is s/p angiography of the right lower extremity with the hope for intervention for limb salvage. Will discharge patient on aspirin and statins Appreciate podiatry input and they recommend 10 days of doxycycline on discharge Appreciate PT input. They recommend home health PT upon discharge      AKI Baseline serum creatinine of 1.1.  1.73 on admission and down to 1.53 Patient's Entresto, spironolactone and Lasix were placed on hold for 24 hours Monitor renal function closely especially while on vancomycin     Chronic HFrEF Euvolemic Continue spironolactone, Entresto and furosemide     PAF Currently on a heparin drip pending podiatry intervention Continue to hold Eliquis     PVD Denied any claudication Had stenting x 2 in May on the right leg this year ABI's show moderate bilateral lower extremity peripheral arterial disease.  Continue Plavix and statin Consult vascular surgery     Type 2 diabetes mellitus Hold Amaryl and Farxiga while patient is hospitalized Maintain consistent carbohydrate diet Continue SSI   Liver transplant -Continue Progr       Subjective: Patient is seen and examined at the bedside. Left foot pain persists but improved.  Physical Exam: Vitals:   01/17/23 1345 01/17/23 1350 01/17/23 1415 01/17/23 1430  BP: (!) 91/51 (!) 89/56 (!) 104/58 92/61  Pulse: 77 78 80 81  Resp: 17 12 14 19   Temp:  TempSrc:      SpO2: 100% 100% 96% 97%  Weight:      Height:       Eyes: PERRL, lids and conjunctivae normal ENMT: Mucous membranes are moist. Posterior pharynx clear of any exudate or lesions.Normal dentition.  Neck: normal, supple, no masses, no thyromegaly Respiratory: clear to auscultation bilaterally, no wheezing, no crackles.  Normal respiratory effort. No accessory muscle use.  Cardiovascular: Regular rate and rhythm, no murmurs / rubs / gallops. No extremity edema. 2+ pedal pulses. No carotid bruits.  Abdomen: no tenderness, no masses palpated. No hepatosplenomegaly. Bowel sounds positive.  Musculoskeletal: no clubbing / cyanosis. No joint deformity upper and lower extremities. Good ROM, no contractures. Normal muscle tone.  Skin: Dressing over left foot Neurologic: CN 2-12 grossly intact. Sensation intact, DTR normal. Strength 5/5 in all 4.  Psychiatric: Normal judgment and insight. Alert and oriented x 3. Normal mood.     Data Reviewed: Labs reviewed.  White count 9.7, creatinine 1.3 There are no new results to review at this time.  Family Communication: Plan of care discussed with patient and his wife at the bedside.  They verbalized understanding and agree with the plan.  Disposition: Status is: Inpatient Remains inpatient appropriate because: Possible discharge in a.m.  Planned Discharge Destination: Home with Home Health    Time spent: 34 minutes  Author: Lucile Shutters, MD 01/17/2023 2:39 PM  For on call review [email or web address].

## 2023-01-17 NOTE — Progress Notes (Signed)
PHARMACY - ANTICOAGULATION CONSULT NOTE  Pharmacy Consult for IV heparin Indication: atrial fibrillation  Allergies  Allergen Reactions   Levaquin [Levofloxacin] Anaphylaxis    Patient Measurements: Height: 5\' 10"  (177.8 cm) Weight: 74.4 kg (164 lb 0.4 oz) IBW/kg (Calculated) : 73 Heparin Dosing Weight: 74.4 kg  Vital Signs: Temp: 98.3 F (36.8 C) (10/25 1221) Temp Source: Oral (10/25 1221) BP: 104/58 (10/25 1415) Pulse Rate: 80 (10/25 1415)  Labs: Recent Labs    01/15/23 0435 01/15/23 1456 01/16/23 0011 01/16/23 0819 01/17/23 0543 01/17/23 0953  HGB 10.0*  --   --  10.3* 10.1*  --   HCT 30.4*  --   --  30.6* 30.5*  --   PLT 255  --   --  269 258  --   APTT 55*   < > 67* 82* 64*  --   HEPARINUNFRC >1.10*  --   --  0.82* 0.47  --   CREATININE 1.47*  --   --  1.53*  --  1.31*   < > = values in this interval not displayed.    Estimated Creatinine Clearance: 48.8 mL/min (A) (by C-G formula based on SCr of 1.31 mg/dL (H)).   Medical History: Past Medical History:  Diagnosis Date   AICD (automatic cardioverter/defibrillator) present    Diabetes mellitus without complication (HCC)    Hypertension    Myocardial infarction [facility])    x2 2005, 2006    Medications:  Eliquis last dose 10/22 AM  Assessment: 77 year old male admitted with diabetic ulcer infection. PMH includes Afib on Eliquis. Last dose of Eliquis 10/22 AM.  Goal of Therapy:  Heparin level 0.3-0.7 units/ml Monitor platelets by anticoagulation protocol: Yes   Plan: heparin held for procedure.  --Restart heparin at previously therapeutic rate of 1150 units/hr without bolus @1500  ---Next heparin level in 8 hours ---Continue to monitor H&H and platelets   Elliot Gurney, PharmD, BCPS Clinical Pharmacist  01/17/2023 2:28 PM

## 2023-01-17 NOTE — Progress Notes (Signed)
Subjective:  Patient ID: Dean King, male    DOB: 1945-08-27,  MRN: [id number]  A 77 y.o. male presents with right posterior lateral status post partial fifth ray amputation close.  Patient states is doing well.  Pain is controlled now.  Bandages clean dry and intact no nausea fever chills vomiting  Objective:   Vitals:   01/17/23 1515 01/17/23 1524  BP: 110/66 (!) 107/58  Pulse: 82 84  Resp: 14 18  Temp:  98.4 F (36.9 C)  SpO2: 95% 100%   General AA&O x3. Normal mood and affect.  Vascular Dorsalis pedis and posterior tibial pulses 2/4 bilat. Brisk capillary refill to all digits. Pedal hair present.  Neurologic Epicritic sensation grossly intact.  Dermatologic Bandages clean dry and intact.  No calf pain noted.  Motor or sensory functions are intact.  No strikethrough noted  Orthopedic: MMT 5/5 in dorsiflexion, plantarflexion, inversion, and eversion. Normal joint ROM without pain or crepitus.    Assessment & Plan:  Patient was evaluated and treated and all questions answered.  Right foot osteomyelitis status post partial fifth ray amputation -All questions and concerns were discussed with the patient extensive detail -Patient is okay to be discharged from podiatric standpoint. -Patient underwent vascular procedure today with the Recannulization of the right lower extremity -No dressing change until follow-up.  Follow-up 1 week from discharge -14 days of doxycycline p.o. for antibiotics/soft tissue prophylaxis.  Will follow-up culture no growth yet -Partial weightbearing to the heel for transfer ideally nonweightbearing to the right lower extremity   Candelaria Stagers, DPM  Accessible via secure chat for questions or concerns.

## 2023-01-17 NOTE — Progress Notes (Signed)
Night of surgery note  Patient sitting up eating his supper.  He states his foot is feeling better.  We discussed the results of the angiogram and the successful intervention.  He will follow-up with me or Dr. Wyn Quaker in the office

## 2023-01-17 NOTE — Plan of Care (Signed)

## 2023-01-17 NOTE — Progress Notes (Signed)
PHARMACY - ANTICOAGULATION CONSULT NOTE  Pharmacy Consult for IV heparin Indication: atrial fibrillation  Allergies  Allergen Reactions   Levaquin [Levofloxacin] Anaphylaxis    Patient Measurements: Height: 5\' 10"  (177.8 cm) Weight: 74.4 kg (164 lb 0.4 oz) IBW/kg (Calculated) : 73 Heparin Dosing Weight: 74.4 kg  Vital Signs: Temp: 98.4 F (36.9 C) (10/25 1953) Temp Source: Oral (10/25 1524) BP: 93/47 (10/25 1953) Pulse Rate: 83 (10/25 1953)  Labs: Recent Labs    01/15/23 0435 01/15/23 1456 01/16/23 0011 01/16/23 0819 01/17/23 0543 01/17/23 0953 01/17/23 2225  HGB 10.0*  --   --  10.3* 10.1*  --   --   HCT 30.4*  --   --  30.6* 30.5*  --   --   PLT 255  --   --  269 258  --   --   APTT 55*   < > 67* 82* 64*  --   --   HEPARINUNFRC >1.10*  --   --  0.82* 0.47  --  0.39  CREATININE 1.47*  --   --  1.53*  --  1.31*  --    < > = values in this interval not displayed.    Estimated Creatinine Clearance: 48.8 mL/min (A) (by C-G formula based on SCr of 1.31 mg/dL (H)).   Medical History: Past Medical History:  Diagnosis Date   AICD (automatic cardioverter/defibrillator) present    Diabetes mellitus without complication (HCC)    Hypertension    Myocardial infarction [facility])    x2 2005, 2006    Medications:  Eliquis last dose 10/22 AM  Assessment: 77 year old male admitted with diabetic ulcer infection. PMH includes Afib on Eliquis. Last dose of Eliquis 10/22 AM.  Goal of Therapy:  Heparin level 0.3-0.7 units/ml Monitor platelets by anticoagulation protocol: Yes   Plan:  10/25: HL @ 2225 = 0.39, therapeutic X 2 - Will continue pt on current rate and recheck HL on 10/26 @ 0500.   Karely Hurtado D Clinical Pharmacist  01/17/2023 11:06 PM

## 2023-01-18 DIAGNOSIS — M86171 Other acute osteomyelitis, right ankle and foot: Secondary | ICD-10-CM | POA: Diagnosis not present

## 2023-01-18 LAB — GLUCOSE, CAPILLARY
Glucose-Capillary: 120 mg/dL — ABNORMAL HIGH (ref 70–99)
Glucose-Capillary: 211 mg/dL — ABNORMAL HIGH (ref 70–99)

## 2023-01-18 LAB — HEPARIN LEVEL (UNFRACTIONATED): Heparin Unfractionated: 0.41 [IU]/mL (ref 0.30–0.70)

## 2023-01-18 LAB — CBC
HCT: 28.5 % — ABNORMAL LOW (ref 39.0–52.0)
Hemoglobin: 9.7 g/dL — ABNORMAL LOW (ref 13.0–17.0)
MCH: 27 pg (ref 26.0–34.0)
MCHC: 34 g/dL (ref 30.0–36.0)
MCV: 79.4 fL — ABNORMAL LOW (ref 80.0–100.0)
Platelets: 242 10*3/uL (ref 150–400)
RBC: 3.59 MIL/uL — ABNORMAL LOW (ref 4.22–5.81)
RDW: 20.9 % — ABNORMAL HIGH (ref 11.5–15.5)
WBC: 8.7 10*3/uL (ref 4.0–10.5)
nRBC: 0 % (ref 0.0–0.2)

## 2023-01-18 LAB — CREATININE, SERUM
Creatinine, Ser: 1.35 mg/dL — ABNORMAL HIGH (ref 0.61–1.24)
GFR, Estimated: 54 mL/min — ABNORMAL LOW (ref >60)

## 2023-01-18 MED ORDER — OXYCODONE HCL 5 MG PO TABS: 5.0000 mg | ORAL_TABLET | Freq: Four times a day (QID) | ORAL | 0 refills | Status: AC | PRN

## 2023-01-18 MED ORDER — GABAPENTIN 100 MG PO CAPS: 100.0000 mg | ORAL_CAPSULE | Freq: Three times a day (TID) | ORAL | 0 refills | Status: AC

## 2023-01-18 MED ORDER — DOXYCYCLINE HYCLATE 100 MG PO TABS: 100.0000 mg | ORAL_TABLET | Freq: Two times a day (BID) | ORAL | 0 refills | Status: AC

## 2023-01-18 NOTE — Discharge Summary (Addendum)
Physician Discharge Summary   Patient: Dean King DOB: 1945/05/10  Admit date:     01/14/2023  Discharge date: 01/18/23  Discharge Physician: Stiven Kaspar   PCP: Jaclyn Shaggy, MD   Recommendations at discharge:   Complete antibiotic therapy as recommended Follow-up with podiatry as an outpatient Check blood sugars daily  Discharge Diagnoses: Principal Problem:   Osteomyelitis (HCC) Active Problems:   TIA (transient ischemic attack)   CAD (coronary artery disease)   Essential hypertension   Liver transplant recipient [facility])   Diabetes mellitus type 2 in nonobese (HCC)   Cardiomyopathy (HCC)   Heart failure with reduced ejection fraction (HCC)   Atherosclerosis of native arteries of extremity with intermittent claudication (HCC)   Cellulitis of right lower extremity   Acute osteomyelitis of metatarsal bone of right foot (HCC)  Resolved Problems:   * No resolved hospital problems. *  Hospital Course:  Dean King is a 77 y.o. 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   Patient has history of chronic right foot ulcer been taking care of podiatry since March of this year.  Treatment including outpatient wound care center debridement and 2 rounds of p.o. cyclin, the most recent round of p.o. antibiotics completed about 3 to 4 weeks ago.  After that, patient seemed stable dry right foot ulcer.  Last Friday however patient started to develop severe pain associated with the right foot ulcer, last 2 days even putting weight on right foot and gravity triggered significant pain.  Denies any fever chills, no claudication.  Wife does report that the wound started to drain again with thick yellowish pus for the last 3 days.  Today, patient went to see podiatry who sent patient to ED.   ED Course: Afebrile, nontachycardic blood pressure 101/55.  WBC 9.6,  hemoglobin 12.8, hemoglobin 11.6, creatinine 1.7 compared to baseline 1.1.   Patient was started on vancomycin and ceftriaxone in the ED.   Review of Systems: As per HPI otherwise 14 point review of systems negative.       Assessment and Plan:  Osteomyelitis (HCC) Right foot diabetic ulcer infection PAD Patient presents to the ER for evaluation of severe pain involving a chronic right foot ulcer, difficulty bearing weight on his right foot and purulent drainage MRI of the right foot showed lateral forefoot ulceration adjacent to the fifth MTP joint extending to bone. 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 Fifth MTP joint septic arthritis. Small sub centimeter abscess superior to the fifth MTP joint. 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  This represents a limb threatening ischemia and places the patient at the risk for right limb loss. Patient is s/p successful recanalization right lower extremity for limb salvage  Will discharge patient on Plavix, Eliquis metoprolol and statins. Vascular surgery had recommended aspirin as well but patient is concerned about increased bleeding risk, would rather be on Plavix and Eliquis Appreciate podiatry input and they recommend 10 days of doxycycline on discharge Appreciate PT input. They recommend home health PT upon discharge which patient declined.        AKI Baseline serum creatinine of 1.1.  1.73 on admission and down to 1.35 Patient's Entresto, spironolactone  and Lasix were placed on hold for 24 hours and then resumed Monitor renal function closely especially while on vancomycin      Chronic HFrEF Euvolemic Continue spironolactone, Entresto and furosemide     PAF Continue metoprolol for rate control Continue  Eliquis       Type 2 diabetes mellitus Resume Amaryl and Farxiga  Maintain consistent carbohydrate diet Check blood sugars daily     Liver transplant -Continue Prograf            Consultants: Podiatry, Vascular surgery Procedures performed: Partial 5th ray, Left leg angiography Disposition: Home Diet recommendation:  Discharge Diet Orders (From admission, onward)     Start     Ordered   01/18/23 0000  Diet - low sodium heart healthy        01/18/23 1107   01/18/23 0000  Diet Carb Modified        01/18/23 1107           Cardiac and Carb modified diet DISCHARGE MEDICATION: Allergies as of 01/18/2023       Reactions   Levaquin [levofloxacin] Anaphylaxis        Medication List     TAKE these medications    atorvastatin 40 MG tablet Commonly known as: LIPITOR Take 1 tablet (40 mg total) by mouth daily.   Calcium 600+D3 Plus Minerals 600-800 MG-UNIT Tabs Take 1 tablet by mouth 3 (three) times daily.   clopidogrel 75 MG tablet Commonly known as: PLAVIX TAKE 1 TABLET BY MOUTH EVERY DAY   dapagliflozin propanediol 10 MG Tabs tablet Commonly known as: FARXIGA Take by mouth daily.   doxycycline 100 MG tablet Commonly known as: VIBRA-TABS Take 1 tablet (100 mg total) by mouth 2 (two) times daily for 10 days.   Eliquis 5 MG Tabs tablet Generic drug: apixaban Take 1 tablet (5 mg total) by mouth 2 (two) times daily.   Entresto 24-26 MG Generic drug: sacubitril-valsartan Take 0.5 tablets by mouth 2 (two) times daily.   furosemide 40 MG tablet Commonly known as: LASIX Take 80 mg by mouth daily.   gabapentin 100 MG capsule Commonly known as: NEURONTIN Take 1 capsule (100 mg total) by mouth 3 (three) times daily.   glipiZIDE 5 MG tablet Commonly known as: GLUCOTROL Take 5 mg by mouth 2 (two) times daily before a meal.   magnesium oxide 400 MG tablet Commonly known as: MAG-OX Take 400 mg by mouth 2 (two) times daily.   metFORMIN 500 MG  tablet Commonly known as: GLUCOPHAGE Take 1,000 mg by mouth 2 (two) times daily.   metoprolol succinate 100 MG 24 hr tablet Commonly known as: TOPROL-XL Take 150 mg by mouth daily. Take 1 tablet (100mg ) by mouth daily and 0.5 tablet (50mg ) nightly   nitroGLYCERIN 0.4 MG SL tablet Commonly known as: NITROSTAT Place 0.4 mg under the tongue as needed.   oxyCODONE 5 MG immediate release tablet Commonly known as: Roxicodone Take 1 tablet (5 mg total) by mouth every 6 (six) hours as needed for up to 5 days for severe pain (pain score 7-10).   oxyCODONE-acetaminophen 5-325 MG tablet Commonly known as: PERCOCET/ROXICET Take 1-2 tablets by mouth every 6 (six) hours as needed for moderate pain.   Prograf 0.5 MG capsule Generic drug: tacrolimus Take 0.5 mg by mouth 2 (two) times daily.   spironolactone 25 MG tablet Commonly known as: ALDACTONE Take 12.5 mg by mouth daily.        Follow-up Information  Candelaria Stagers, DPM Follow up on 01/23/2023.   Specialty: Podiatry Contact information: [address] [place] [address] [phone]                Discharge Exam: Ceasar Mons Weights   01/14/23 0946 01/17/23 1221  Weight: 74.4 kg 74.4 kg   Eyes: PERRL, lids and conjunctivae normal ENMT: Mucous membranes are moist. Posterior pharynx clear of any exudate or lesions.Normal dentition.  Neck: normal, supple, no masses, no thyromegaly Respiratory: clear to auscultation bilaterally, no wheezing, no crackles. Normal respiratory effort. No accessory muscle use.  Cardiovascular: Regular rate and rhythm, no murmurs / rubs / gallops. No extremity edema. 2+ pedal pulses. No carotid bruits.  Abdomen: no tenderness, no masses palpated. No hepatosplenomegaly. Bowel sounds positive.  Musculoskeletal: no clubbing / cyanosis. No joint deformity upper and lower extremities. Good ROM, no contractures. Normal muscle tone.  Skin: Dressing over left foot Neurologic: CN 2-12 grossly intact.  Sensation intact, DTR normal. Strength 5/5 in all 4.  Psychiatric: Normal judgment and insight. Alert and oriented x 3. Normal mood.    Condition at discharge: stable  The results of significant diagnostics from this hospitalization (including imaging, microbiology, ancillary and laboratory) are listed below for reference.   Imaging Studies: PERIPHERAL VASCULAR CATHETERIZATION  Result Date: 01/17/2023 See surgical note for result.  DG Foot Complete Right  Result Date: 01/16/2023 CLINICAL DATA:  Status post fifth toe amputation, initial encounter EXAM: RIGHT FOOT COMPLETE - 3+ VIEW COMPARISON:  MRI from the previous day. FINDINGS: There is been interval amputation of the fifth metatarsal near the base. No residual all erosive changes are noted. No soft tissue abnormality is seen. IMPRESSION: Status post fifth digit and metatarsal amputation. Electronically Signed   By: Alcide Clever M.D.   On: 01/16/2023 17:01   US ARTERIAL ABI (SCREENING LOWER EXTREMITY)  Result Date: 01/16/2023 CLINICAL DATA:  Diabetic foot ulcer EXAM: NONINVASIVE PHYSIOLOGIC VASCULAR STUDY OF BILATERAL LOWER EXTREMITIES TECHNIQUE: Evaluation of both lower extremities were performed at rest, including calculation of ankle-brachial indices with single level pressure measurements and doppler recording. COMPARISON:  None available. FINDINGS: Right ABI:  0.73 Left ABI:  0.59 Right Lower Extremity: Dorsalis pedis and posterior tibial artery waveforms are monophasic. Left Lower Extremity: Dorsalis pedis and posterior tibial artery waveforms are monophasic. 0.5-0.79 Moderate PAD IMPRESSION: Moderate bilateral lower extremity peripheral arterial disease. Electronically Signed   By: Acquanetta Belling M.D.   On: 01/16/2023 08:13   US RENAL  Result Date: 01/15/2023 CLINICAL DATA:  Acute kidney injury EXAM: RENAL / URINARY TRACT ULTRASOUND COMPLETE COMPARISON:  CT abdomen and pelvis 08/18/2005 FINDINGS: Right Kidney: Renal measurements:  10.9 x 5.7 x 4.4 cm = volume: 144 mL. Echogenicity within normal limits. No mass or hydronephrosis visualized. Left Kidney: Renal measurements: 10.1 x 5.6 x 4.8 cm = volume: 144 mL. Echogenicity within normal limits. No mass or hydronephrosis visualized. Bladder: Appears normal for degree of bladder distention. Other: None. IMPRESSION: Normal ultrasound appearance of the kidneys. No hydronephrosis. Bladder is unremarkable. Electronically Signed   By: Burman Nieves M.D.   On: 01/15/2023 21:59   MR FOOT RIGHT WO CONTRAST  Result Date: 01/15/2023 CLINICAL DATA:  Lateral foot ulcer. EXAM: MRI OF THE RIGHT FOREFOOT WITHOUT CONTRAST TECHNIQUE: Multiplanar, multisequence MR imaging of the right forefoot was performed. No intravenous contrast was administered. COMPARISON:  None Available. FINDINGS: Bones/Joint/Cartilage Bony destruction and pathologic fracture of the fifth metatarsal head. Prominent marrow edema involving the proximal half of the  fifth proximal phalanx. Large fifth MTP joint effusion. Fifth MTP joint subluxation. Ligaments Fifth MTP joint collateral ligaments are torn. Remaining toe collateral ligaments are intact. Lisfranc ligament is intact. Muscles and Tendons Flexor and extensor tendons are intact. No tenosynovitis. Increased T2 signal within the intrinsic muscles of the forefoot, nonspecific, but likely related to diabetic muscle changes. Soft tissue Lateral forefoot ulceration adjacent to the fifth MTP joint extending to bone. Small 6 x 9 x 8 mm fluid collection superior to the fifth MTP joint. No soft tissue mass. IMPRESSION: 1. Lateral forefoot ulceration adjacent to the fifth MTP joint extending to bone. 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 Fifth MTP joint septic arthritis. Small subcentimeter abscess superior to the fifth MTP joint. Electronically Signed   By: Obie Dredge M.D.   On: 01/15/2023 12:56   DG Chest 2 View  Result Date: 01/14/2023 CLINICAL  DATA:  History of chronic heart failure with ICD in-situ EXAM: CHEST - 2 VIEW COMPARISON:  Chest radiograph dated 07/11/2004 FINDINGS: Lines/tubes: Left chest wall ICD leads project over the right atrium and ventricle. Lungs: Well inflated lungs. No focal consolidation. Pleura: No pneumothorax or pleural effusion. Heart/mediastinum: The heart size and mediastinal contours are within normal limits. Bones: No acute osseous abnormality. IMPRESSION: Left chest wall ICD leads project over the right atrium and ventricle. No pneumothorax. Electronically Signed   By: Agustin Cree M.D.   On: 01/14/2023 17:52    Microbiology: Results for orders placed or performed during the hospital encounter of 01/14/23  Aerobic/Anaerobic Culture w Gram Stain (surgical/deep wound)     Status: None (Preliminary result)   Collection Time: 01/16/23 12:26 PM   Specimen: Toe, Right; Amputation  Result Value Ref Range Status   Specimen Description   Final    TOE Performed at [facility], [address] [place], [place] [address]    Special Requests   Final    RIGHT TOE Performed at [facility], [address] [place], [place] [address]    Gram Stain   Final    RARE WBC PRESENT, PREDOMINANTLY MONONUCLEAR NO ORGANISMS SEEN    Culture   Final    NO GROWTH < 12 HOURS Performed at [facility], [address], [place], [place] [address]    Report Status PENDING  Incomplete    Labs: CBC: Recent Labs  Lab 01/14/23 0948 01/15/23 0435 01/16/23 0819 01/17/23 0543 01/18/23 0503  WBC 12.8* 9.8 9.3 9.7 8.7  HGB 11.6* 10.0* 10.3* 10.1* 9.7*  HCT 36.0* 30.4* 30.6* 30.5* 28.5*  MCV 81.1 79.6* 79.3* 80.3 79.4*  PLT 289 255 269 258 242   Basic Metabolic Panel: Recent Labs  Lab 01/14/23 0948 01/15/23 0435 01/16/23 0819 01/17/23 0953 01/18/23 0503  NA 134* 134*  --   --   --   K 4.1 3.6  --   --   --   CL 98 99  --   --   --   CO2 23 25  --   --   --   GLUCOSE 211* 138*  --   --   --    BUN 36* 35*  --   --   --   CREATININE 1.73* 1.47* 1.53* 1.31* 1.35*  CALCIUM 9.5 8.7*  --   --   --    Liver Function Tests: No results for input(s): "AST", "ALT", "ALKPHOS", "BILITOT", "PROT", "ALBUMIN" in the last 168 hours. CBG: Recent Labs  Lab 01/17/23  1506 01/17/23 1612 01/17/23 1655 01/17/23 2135 01/18/23 0741  GLUCAP 123* 166* 172* 204* 120*    Discharge time spent: greater than 30 minutes.  Signed: Lucile Shutters, MD [facility] 01/18/2023

## 2023-01-18 NOTE — Plan of Care (Signed)
  Problem: Acute Rehab PT Goals(only PT should resolve) Goal: Pt Will Ambulate Outcome: Completed/Met Goal: Pt Will Go Up/Down Stairs Outcome: Completed/Met

## 2023-01-18 NOTE — Progress Notes (Signed)
The patient has been discharged. IV has been removed. Education has been completed with the patient and wife.

## 2023-01-18 NOTE — Progress Notes (Signed)
PHARMACY - ANTICOAGULATION CONSULT NOTE  Pharmacy Consult for IV heparin Indication: atrial fibrillation  Allergies  Allergen Reactions   Levaquin [Levofloxacin] Anaphylaxis    Patient Measurements: Height: 5\' 10"  (177.8 cm) Weight: 74.4 kg (164 lb 0.4 oz) IBW/kg (Calculated) : 73 Heparin Dosing Weight: 74.4 kg  Vital Signs: Temp: 98.3 F (36.8 C) (10/26 0514) Temp Source: Oral (10/26 0514) BP: 108/63 (10/26 0514) Pulse Rate: 77 (10/26 0514)  Labs: Recent Labs    01/16/23 0011 01/16/23 0819 01/16/23 0819 01/17/23 0543 01/17/23 0953 01/17/23 2225 01/18/23 0503  HGB  --  10.3*   < > 10.1*  --   --  9.7*  HCT  --  30.6*  --  30.5*  --   --  28.5*  PLT  --  269  --  258  --   --  242  APTT 67* 82*  --  64*  --   --   --   HEPARINUNFRC  --  0.82*   < > 0.47  --  0.39 0.41  CREATININE  --  1.53*  --   --  1.31*  --  1.35*   < > = values in this interval not displayed.    Estimated Creatinine Clearance: 47.3 mL/min (A) (by C-G formula based on SCr of 1.35 mg/dL (H)).   Medical History: Past Medical History:  Diagnosis Date   AICD (automatic cardioverter/defibrillator) present    Diabetes mellitus without complication (HCC)    Hypertension    Myocardial infarction [facility])    x2 2005, 2006    Medications:  Eliquis last dose 10/22 AM  Assessment: 77 year old male admitted with diabetic ulcer infection. PMH includes Afib on Eliquis. Last dose of Eliquis 10/22 AM.  Goal of Therapy:  Heparin level 0.3-0.7 units/ml Monitor platelets by anticoagulation protocol: Yes   Plan:  10/26:  HL @ 0503 = 0.41, therapeutic X 3 - Will continue pt on current rate and recheck HL on 10/27 with AM labs.   Shaquan Puerta D Clinical Pharmacist  01/18/2023 5:58 AM

## 2023-01-18 NOTE — Plan of Care (Signed)
Adequate for discharge.

## 2023-01-20 ENCOUNTER — Ambulatory Visit (INDEPENDENT_AMBULATORY_CARE_PROVIDER_SITE_OTHER): Payer: Medicare Other | Admitting: Nurse Practitioner

## 2023-01-20 ENCOUNTER — Encounter (INDEPENDENT_AMBULATORY_CARE_PROVIDER_SITE_OTHER): Payer: Medicare Other

## 2023-01-20 ENCOUNTER — Encounter: Payer: Self-pay | Admitting: Vascular Surgery

## 2023-01-20 ENCOUNTER — Telehealth: Payer: Self-pay | Admitting: *Deleted

## 2023-01-20 LAB — SURGICAL PATHOLOGY

## 2023-01-20 NOTE — Telephone Encounter (Signed)
Patient was scheduled to come for a cardiac rehab orientation appointment. After reviewing patient chart patient was called and the appointment was cancelled due to his recent hospitalization. He still desires to do the program but a note from his hospitalization noted "cardio consult needed." Patient was advised to contact his cardiologist and have a follow up appointment before starting the program. Patient will contact us after this appointment to reschedule his orientation.

## 2023-01-22 LAB — AEROBIC/ANAEROBIC CULTURE W GRAM STAIN (SURGICAL/DEEP WOUND)

## 2023-01-23 ENCOUNTER — Encounter: Payer: Self-pay | Admitting: Podiatry

## 2023-01-23 ENCOUNTER — Ambulatory Visit (INDEPENDENT_AMBULATORY_CARE_PROVIDER_SITE_OTHER): Payer: Medicare Other | Admitting: Podiatry

## 2023-01-23 VITALS — Ht 70.0 in | Wt 164.0 lb

## 2023-01-23 DIAGNOSIS — Z89421 Acquired absence of other right toe(s): Secondary | ICD-10-CM

## 2023-01-23 NOTE — Progress Notes (Signed)
Subjective:  Patient ID: Dean King, male    DOB: [birth year],  MRN: [id number]  Chief Complaint  Patient presents with   Routine Post Op    Ulcer of right foot with fat layer exposed      DOS: 01/16/2023 Procedure: Right partial fifth ray amputation  77 y.o. male returns for post-op check.  Patient states he is doing well minimal pain.  Weightbearing as tolerated to the heel with this wedge shoe.  Bandages clean dry and intact  Review of Systems: Negative except as noted in the HPI. Denies N/V/F/Ch.  Past Medical History:  Diagnosis Date   AICD (automatic cardioverter/defibrillator) present    Diabetes mellitus without complication (HCC)    Hypertension    Myocardial infarction [facility])    x2 2005, 2006    Current Outpatient Medications:    atorvastatin (LIPITOR) 40 MG tablet, Take 1 tablet (40 mg total) by mouth daily., Disp: 30 tablet, Rfl: 2   Calcium Carbonate-Vit D-Min (CALCIUM 600+D3 PLUS MINERALS) 600-800 MG-UNIT TABS, Take 1 tablet by mouth 3 (three) times daily., Disp: , Rfl:    clopidogrel (PLAVIX) 75 MG tablet, TAKE 1 TABLET BY MOUTH EVERY DAY, Disp: 90 tablet, Rfl: 1   dapagliflozin propanediol (FARXIGA) 10 MG TABS tablet, Take by mouth daily., Disp: , Rfl:    ELIQUIS 5 MG TABS tablet, Take 1 tablet (5 mg total) by mouth 2 (two) times daily., Disp: 60 tablet, Rfl: 5   ENTRESTO 24-26 MG, Take 0.5 tablets by mouth 2 (two) times daily., Disp: , Rfl:    furosemide (LASIX) 40 MG tablet, Take 80 mg by mouth daily., Disp: , Rfl:    gabapentin (NEURONTIN) 100 MG capsule, Take 1 capsule (100 mg total) by mouth 3 (three) times daily., Disp: 90 capsule, Rfl: 0   glipiZIDE (GLUCOTROL) 5 MG tablet, Take 5 mg by mouth 2 (two) times daily before a meal., Disp: , Rfl:    magnesium oxide (MAG-OX) 400 MG tablet, Take 400 mg by mouth 2 (two) times daily., Disp: , Rfl:    metFORMIN (GLUCOPHAGE) 500 MG tablet, Take 1,000 mg by mouth 2 (two) times daily., Disp: , Rfl:    metoprolol  succinate (TOPROL-XL) 100 MG 24 hr tablet, Take 150 mg by mouth daily. Take 1 tablet (100mg ) by mouth daily and 0.5 tablet (50mg ) nightly, Disp: , Rfl:    nitroGLYCERIN (NITROSTAT) 0.4 MG SL tablet, Place 0.4 mg under the tongue as needed., Disp: , Rfl:    PROGRAF 0.5 MG capsule, Take 0.5 mg by mouth 2 (two) times daily., Disp: , Rfl:    spironolactone (ALDACTONE) 25 MG tablet, Take 12.5 mg by mouth daily., Disp: , Rfl:   Social History   Tobacco Use  Smoking Status Some Days   Types: Cigars  Smokeless Tobacco Never  Tobacco Comments   Occasional cigar when I play golf. 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 Body mass index is 23.53 kg/m. Constitutional Well developed. Well nourished.  Vascular Foot warm and well perfused. Capillary refill normal to all digits.   Neurologic Normal speech. Oriented to person, place, and time. Epicritic sensation to light touch grossly present bilaterally.  Dermatologic Skin healing well without signs of infection. Skin edges well coapted without signs of infection.  Orthopedic: Tenderness to palpation noted about the surgical site.   Radiographs: None Assessment:   1. 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  S/p foot surgery right -Progressing as expected post-operatively. -XR: See above -WB Status: Partial weightbearing in to the heel in surgical shoe -Sutures: Intact.  No clinical signs of dehiscence noted no complication noted -Medications: None -Foot redressed.  No follow-ups on file.

## 2023-02-06 ENCOUNTER — Ambulatory Visit (INDEPENDENT_AMBULATORY_CARE_PROVIDER_SITE_OTHER): Payer: Medicare Other | Admitting: Podiatry

## 2023-02-06 ENCOUNTER — Encounter: Payer: Self-pay | Admitting: Podiatry

## 2023-02-06 DIAGNOSIS — T8130XA Disruption of wound, unspecified, initial encounter: Secondary | ICD-10-CM

## 2023-02-06 DIAGNOSIS — L97512 Non-pressure chronic ulcer of other part of right foot with fat layer exposed: Secondary | ICD-10-CM

## 2023-02-06 NOTE — Progress Notes (Signed)
Subjective:  Patient ID: Dean King, male    DOB: 09/07/1945,  MRN: [id number]  Chief Complaint  Patient presents with   Routine Post Op    DOS: 01/16/2023   Right partial fifth ray amputation "I thought it would feel better now than it did when I first had the surgery.  The bottom of my foot is sore and I have pains in the second and third toes."     DOS: 01/16/2023 Procedure: Right partial fifth ray amputation  77 y.o. male returns for post-op check.  Patient states he is doing well minimal pain.  Weightbearing as tolerated to the heel with this wedge shoe.  Bandages clean dry and intact  Review of Systems: Negative except as noted in the HPI. Denies N/V/F/Ch.  Past Medical History:  Diagnosis Date   AICD (automatic cardioverter/defibrillator) present    Diabetes mellitus without complication (HCC)    Hypertension    Myocardial infarction [facility])    x2 2005, 2006    Current Outpatient Medications:    atorvastatin (LIPITOR) 40 MG tablet, Take 1 tablet (40 mg total) by mouth daily., Disp: 30 tablet, Rfl: 2   Calcium Carbonate-Vit D-Min (CALCIUM 600+D3 PLUS MINERALS) 600-800 MG-UNIT TABS, Take 1 tablet by mouth 3 (three) times daily., Disp: , Rfl:    clopidogrel (PLAVIX) 75 MG tablet, TAKE 1 TABLET BY MOUTH EVERY DAY, Disp: 90 tablet, Rfl: 1   dapagliflozin propanediol (FARXIGA) 10 MG TABS tablet, Take by mouth daily., Disp: , Rfl:    ELIQUIS 5 MG TABS tablet, Take 1 tablet (5 mg total) by mouth 2 (two) times daily., Disp: 60 tablet, Rfl: 5   ENTRESTO 24-26 MG, Take 0.5 tablets by mouth 2 (two) times daily., Disp: , Rfl:    furosemide (LASIX) 40 MG tablet, Take 80 mg by mouth daily., Disp: , Rfl:    gabapentin (NEURONTIN) 100 MG capsule, Take 1 capsule (100 mg total) by mouth 3 (three) times daily., Disp: 90 capsule, Rfl: 0   glipiZIDE (GLUCOTROL) 5 MG tablet, Take 5 mg by mouth 2 (two) times daily before a meal., Disp: , Rfl:    magnesium oxide (MAG-OX) 400 MG tablet, Take 400  mg by mouth 2 (two) times daily., Disp: , Rfl:    metFORMIN (GLUCOPHAGE) 500 MG tablet, Take 1,000 mg by mouth 2 (two) times daily., Disp: , Rfl:    metoprolol succinate (TOPROL-XL) 100 MG 24 hr tablet, Take 150 mg by mouth daily. Take 1 tablet (100mg ) by mouth daily and 0.5 tablet (50mg ) nightly, Disp: , Rfl:    nitroGLYCERIN (NITROSTAT) 0.4 MG SL tablet, Place 0.4 mg under the tongue as needed., Disp: , Rfl:    PROGRAF 0.5 MG capsule, Take 0.5 mg by mouth 2 (two) times daily., Disp: , Rfl:    spironolactone (ALDACTONE) 25 MG tablet, Take 12.5 mg by mouth daily., Disp: , Rfl:   Social History   Tobacco Use  Smoking Status Some Days   Types: Cigars  Smokeless Tobacco Never  Tobacco Comments   Occasional cigar when I play golf. 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 There is no height or weight on file to calculate BMI. Constitutional Well developed. Well nourished.  Vascular Foot warm and well perfused. Capillary refill normal to all digits.   Neurologic Normal speech. Oriented to person, place, and time. Epicritic sensation to light touch grossly present bilaterally.  Dermatologic Wound dehiscence.  Measuring 2 cm x 1.5 cm x 0.4 cm.  Does not probe down to bone fibrogranular tissue noted.  No malodor present purulent no purulent drainage noted  Orthopedic: Tenderness to palpation noted about the surgical site.   Radiographs: None Assessment:   1. Wound dehiscence   2. 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  S/p foot surgery right -Progressing as expected post-operatively. -XR: See above -WB Status: Partial weightbearing in to the heel in surgical shoe -Sutures: Removed.  He states is doing well.  Denies any other acute complaints.  The incision has dehisced.  There is some wound noted at  the distal part.  Patient will benefit from wound care center given the wound dehiscence -Medications: None -Foot redressed.  No follow-ups on file.

## 2023-02-07 ENCOUNTER — Telehealth: Payer: Self-pay | Admitting: Podiatry

## 2023-02-07 NOTE — Telephone Encounter (Signed)
You referred patient to [facility] on 02/06/23.  Patient called to make appointment and was told he could not get an appointment until 03/17/23.  Patient is asking is this ok to wait that long with a wound or can something else be done.

## 2023-02-10 ENCOUNTER — Encounter: Payer: Medicare Other | Attending: Cardiovascular Disease | Admitting: Physician Assistant

## 2023-02-10 DIAGNOSIS — L97512 Non-pressure chronic ulcer of other part of right foot with fat layer exposed: Secondary | ICD-10-CM | POA: Diagnosis present

## 2023-02-10 DIAGNOSIS — I130 Hypertensive heart and chronic kidney disease with heart failure and stage 1 through stage 4 chronic kidney disease, or unspecified chronic kidney disease: Secondary | ICD-10-CM | POA: Insufficient documentation

## 2023-02-10 NOTE — Progress Notes (Signed)
CHANCEY, BLAUSER King ([id number]) [id number] Page 1 of 5 Visit Report for 02/10/2023 Abuse Risk Screen Details Patient Name: Date of Service: Dean King, Dean King 02/10/2023 8:15 A M Medical Record Number: [id number] Patient Account Number: [id number] Date of Birth/Sex: Treating RN: 1945/11/27 (77 y.o. Male) Angelina Pih Primary Care Ewald Beg: Dewaine Oats Other Clinician: Referring Araminta Zorn: Treating Iliya Spivack/Extender: Donette Larry in Treatment: 0 Abuse Risk Screen Items Answer ABUSE RISK SCREEN: Has anyone close to you tried to hurt or harm you recentlyo No Do you feel uncomfortable with anyone in your familyo No Has anyone forced you do things that you didnt want to doo No Electronic Signature(s) Signed: 02/10/2023 4:09:45 PM By: Angelina Pih Entered By: Angelina Pih on 02/10/2023 05:27:11 -------------------------------------------------------------------------------- Activities of Daily Living Details Patient Name: Date of Service: Dean King, Dean King 02/10/2023 8:15 A M Medical Record Number: [id number] Patient Account Number: [id number] Date of Birth/Sex: Treating RN: 1945/08/02 (77 y.o. Male) Angelina Pih Primary Care Sly Parlee: Dewaine Oats Other Clinician: Referring Reniyah Gootee: Treating Sundeep Cary/Extender: Donette Larry in Treatment: 0 Activities of Daily Living Items Answer Activities of Daily Living (Please select one for each item) Drive Automobile Need Assistance T Medications ake Completely Able Use T elephone Completely Able Care for Appearance Completely Able Use T oilet Completely Able Bath / Shower Completely Able Dress Self Completely Able Feed Self Completely Able Walk Completely Able Get In / Out Bed Completely Able Housework Completely Dean King, Dean King ([id number]) [phone] [id number] Page 2 of 5 Prepare Meals Completely Able Handle Money Completely  Able Shop for Self Completely Able Electronic Signature(s) Signed: 02/10/2023 4:09:45 PM By: Angelina Pih Entered By: Angelina Pih on 02/10/2023 05:27:39 -------------------------------------------------------------------------------- Education Screening Details Patient Name: Date of Service: Dean Croak King. 02/10/2023 8:15 A M Medical Record Number: [id number] Patient Account Number: [id number] Date of Birth/Sex: Treating RN: 15-Jul-1945 (77 y.o. Male) Angelina Pih Primary Care Hazelgrace Bonham: Dewaine Oats Other Clinician: Referring Mayara Paulson: Treating Norell Brisbin/Extender: Donette Larry in Treatment: 0 Primary Learner Assessed: Patient Learning Preferences/Education Level/Primary Language Learning Preference: Explanation, Demonstration, Video, Communication Board, Printed Material Preferred Language: English Cognitive Barrier Language Barrier: No Translator Needed: No Memory Deficit: No Emotional Barrier: No Cultural/Religious Beliefs Affecting Medical Care: No Physical Barrier Impaired Vision: No Impaired Hearing: No Decreased Hand dexterity: No Knowledge/Comprehension Knowledge Level: High Comprehension Level: High Ability to understand written instructions: High Ability to understand verbal instructions: High Motivation Anxiety Level: Calm Cooperation: Cooperative Education Importance: Acknowledges Need Interest in Health Problems: Asks Questions Perception: Coherent Willingness to Engage in Self-Management High Activities: Readiness to Engage in Self-Management High Activities: Electronic Signature(s) Signed: 02/10/2023 4:09:45 PM By: Angelina Pih Entered By: Angelina Pih on 02/10/2023 05:27:57 Jerene Pitch ([id number]) [id number] Page 3 of 5 -------------------------------------------------------------------------------- Fall Risk Assessment Details Patient Name: Date of Service: Dean King, Dean King  02/10/2023 8:15 A M Medical Record Number: [id number] Patient Account Number: [id number] Date of Birth/Sex: Treating RN: February 07, 1946 (77 y.o. Male) Angelina Pih Primary Care Ruhan Borak: Dewaine Oats Other Clinician: Referring Kamorah Nevils: Treating Nikita Surman/Extender: Donette Larry in Treatment: 0 Fall Risk Assessment Items Have you had 2 or more falls in the last 12 monthso 0 No Have you had any fall that resulted in injury in the last 12 monthso 0 No FALLS RISK SCREEN History of falling - immediate or within 3 months 0 No Secondary diagnosis (Do you have 2 or more medical diagnoseso) 0 No Ambulatory aid None/bed rest/wheelchair/nurse 0 Yes Crutches/cane/walker  0 No Furniture 0 No Intravenous therapy Access/Saline/Heparin Lock 0 No Gait/Transferring Normal/ bed rest/ wheelchair 0 Yes Weak (short steps with or without shuffle, stooped but able to lift head while walking, may seek 0 No support from furniture) Impaired (short steps with shuffle, may have difficulty arising from chair, head down, impaired 0 No balance) Mental Status Oriented to own ability 0 Yes Electronic Signature(s) Signed: 02/10/2023 4:09:45 PM By: Angelina Pih Entered By: Angelina Pih on 02/10/2023 05:28:06 -------------------------------------------------------------------------------- Foot Assessment Details Patient Name: Date of Service: Dean Croak King. 02/10/2023 8:15 A M Medical Record Number: [id number] Patient Account Number: [id number] Date of Birth/Sex: Treating RN: 12-16-1945 (77 y.o. Male) Angelina Pih Primary Care Shabrea Weldin: Dewaine Oats Other Clinician: Referring Wesson Stith: Treating Pearley Baranek/Extender: Donette Larry in Treatment: 0 Foot Assessment Items Site Locations Dean King, Dean King ([id number]) [id number] Page 4 of 5 + = Sensation present, - = Sensation absent, C = Callus, U = Ulcer R = Redness, W = Warmth, M = Maceration,  PU = Pre-ulcerative lesion F = Fissure, S = Swelling, King = Dryness Assessment Right: Left: Other Deformity: No No Prior Foot Ulcer: No No Prior Amputation: No No Charcot Joint: No No Ambulatory Status: Ambulatory Without Help Gait: Steady Electronic Signature(s) Signed: 02/10/2023 4:09:45 PM By: Angelina Pih Entered By: Angelina Pih on 02/10/2023 05:45:48 -------------------------------------------------------------------------------- Nutrition Risk Screening Details Patient Name: Date of Service: Dean King, Dean King 02/10/2023 8:15 A M Medical Record Number: [id number] Patient Account Number: [id number] Date of Birth/Sex: Treating RN: Aug 31, 1945 (77 y.o. Male) Angelina Pih Primary Care Mita Vallo: Dewaine Oats Other Clinician: Referring Anaih Brander: Treating Keno Caraway/Extender: Donette Larry in Treatment: 0 Height (in): 70 Weight (lbs): 167 Body Mass Index (BMI): 24 Nutrition Risk Screening Items Score Screening NUTRITION RISK SCREEN: I have an illness or condition that made me change the kind and/or amount of food I eat 0 No I eat fewer than two meals per day 0 No I eat few fruits and vegetables, or milk products 0 No I have three or more drinks of beer, liquor or wine almost every day 0 No I have tooth or mouth problems that make it hard for me to eat 0 No I don't always have enough money to buy the food I need 0 No Dean King, Dean King ([id number]) [phone] [id number] Page 5 of 5 I eat alone most of the time 0 No I take three or more different prescribed or over-the-counter drugs a day 0 No Without wanting to, I have lost or gained 10 pounds in the last six months 0 No I am not always physically able to shop, cook and/or feed myself 0 No Nutrition Protocols Good Risk Protocol 0 No interventions needed Moderate Risk Protocol High Risk Proctocol Risk Level: Good Risk Score: 0 Notes avoids potassium containing foods Electronic  Signature(s) Signed: 02/10/2023 4:09:45 PM By: Angelina Pih Entered By: Angelina Pih on 02/10/2023 05:28:33

## 2023-02-10 NOTE — Progress Notes (Signed)
Dean King ([id number]) [id number] Page 1 of 10 Visit Report for 02/10/2023 Allergy List Details Patient Name: Date of Service: Dean King, Dean King 02/10/2023 8:15 A M Medical Record Number: [id number] Patient Account Number: [id number] Date of Birth/Sex: Treating RN: [birth year] (77 y.o. Male) Angelina Pih Primary Care [name]: Dewaine Oats Other Clinician: Referring Scottie Stanish: Treating Vernisha Bacote/Extender: Baldemar Friday Weeks in Treatment: 0 Allergies Active Allergies Levaquin Allergy Notes Electronic Signature(s) Signed: 02/10/2023 4:09:45 PM By: Angelina Pih Entered By: Angelina Pih on 02/10/2023 08:23:57 -------------------------------------------------------------------------------- Arrival Information Details Patient Name: Date of Service: Dean King. 02/10/2023 8:15 A M Medical Record Number: [id number] Patient Account Number: [id number] Date of Birth/Sex: Treating RN: 01-Jan-1946 (77 y.o. Male) Angelina Pih Primary Care Nikoleta Dady: Dewaine Oats Other Clinician: Referring Aneth Schlagel: Treating Kateryn Marasigan/Extender: Donette Larry in Treatment: 0 Visit Information Patient Arrived: Ambulatory Arrival Time: 08:10 Accompanied By: spouse Transfer Assistance: None Patient Identification Verified: Yes Secondary Verification Process Completed: Yes Patient Has Alerts: Yes Patient Alerts: Patient on Blood Thinner type 2 diabetic Notes front off loader surgical shoe on right foot Dean King ([id number]) [id number] Page 2 of 10 Electronic Signature(s) Signed: 02/10/2023 11:10:51 AM By: Angelina Pih Previous Signature: 02/10/2023 8:51:22 AM Version By: Angelina Pih Entered By: Angelina Pih on 02/10/2023 11:10:50 -------------------------------------------------------------------------------- Clinic Level of Care Assessment Details Patient Name: Date of Service: Dean King, Dean King  02/10/2023 8:15 A M Medical Record Number: [id number] Patient Account Number: [id number] Date of Birth/Sex: Treating RN: 01/28/1946 (77 y.o. Male) Angelina Pih Primary Care Edilberto Roosevelt: Dewaine Oats Other Clinician: Referring Patriciann Becht: Treating Nyxon Strupp/Extender: Donette Larry in Treatment: 0 Clinic Level of Care Assessment Items TOOL 1 Quantity Score []  - 0 Use when EandM and Procedure is performed on INITIAL visit ASSESSMENTS - Nursing Assessment / Reassessment X- 1 20 General Physical Exam (combine w/ comprehensive assessment (listed just below) when performed on new pt. evals) X- 1 25 Comprehensive Assessment (HX, ROS, Risk Assessments, Wounds Hx, etc.) ASSESSMENTS - Wound and Skin Assessment / Reassessment []  - 0 Dermatologic / Skin Assessment (not related to wound area) ASSESSMENTS - Ostomy and/or Continence Assessment and Care []  - 0 Incontinence Assessment and Management []  - 0 Ostomy Care Assessment and Management (repouching, etc.) PROCESS - Coordination of Care X - Simple Patient / Family Education for ongoing care 1 15 []  - 0 Complex (extensive) Patient / Family Education for ongoing care X- 1 10 Staff obtains Chiropractor, Records, T Results / Process Orders est []  - 0 Staff telephones HHA, Nursing Homes / Clarify orders / etc []  - 0 Routine Transfer to another Facility (non-emergent condition) []  - 0 Routine Hospital Admission (non-emergent condition) X- 1 15 New Admissions / Manufacturing engineer / Ordering NPWT Apligraf, etc. , []  - 0 Emergency Hospital Admission (emergent condition) PROCESS - Special Needs []  - 0 Pediatric / Minor Patient Management []  - 0 Isolation Patient Management []  - 0 Hearing / Language / Visual special needs []  - 0 Assessment of Community assistance (transportation, King/C planning, etc.) []  - 0 Additional assistance / Altered mentation []  - 0 Support Surface(s) Assessment (bed, cushion, seat,  etc.) INTERVENTIONS - Miscellaneous []  - 0 External ear exam []  - 0 Patient Transfer (multiple staff / [employer] / Similar devices) Dean King ([id number]) [id number] Page 3 of 10 []  - 0 Simple Staple / Suture removal (25 or less) []  - 0 Complex Staple / Suture removal (26 or more) []  - 0 Hypo/Hyperglycemic Management (do not  check if billed separately) X- 1 15 Ankle / Brachial Index (ABI) - do not check if billed separately Has the patient been seen at the hospital within the last three years: Yes Total Score: 100 Level Of Care: New/Established - Level 3 Electronic Signature(s) Signed: 02/10/2023 4:09:45 PM By: Angelina Pih Entered By: Angelina Pih on 02/10/2023 11:13:54 -------------------------------------------------------------------------------- Encounter Discharge Information Details Patient Name: Date of Service: Dean King. 02/10/2023 8:15 A M Medical Record Number: [id number] Patient Account Number: [id number] Date of Birth/Sex: Treating RN: [birth year] (77 y.o. Male) Angelina Pih Primary Care Sham Alviar: Dewaine Oats Other Clinician: Referring Jamille Fisher: Treating Cruz Bong/Extender: Donette Larry in Treatment: 0 Encounter Discharge Information Items Post Procedure Vitals Discharge Condition: Stable Temperature (F): 97.8 Ambulatory Status: Ambulatory Pulse (bpm): 79 Discharge Destination: Home Respiratory Rate (breaths/min): 18 Transportation: Private Auto Blood Pressure (mmHg): 112/67 Accompanied By: wife Schedule Follow-up Appointment: Yes Clinical Summary of Care: Electronic Signature(s) Signed: 02/10/2023 11:15:01 AM By: Angelina Pih Entered By: Angelina Pih on 02/10/2023 11:15:01 -------------------------------------------------------------------------------- Lower Extremity Assessment Details Patient Name: Date of Service: Dean King, Dean King 02/10/2023 8:15 A M Medical Record Number:  [id number] Patient Account Number: [id number] Date of Birth/Sex: Treating RN: [birth year] (77 y.o. Male) Angelina Pih Primary Care Dalexa Gentz: Dewaine Oats Other Clinician: Referring Donavan Kerlin: Treating Andreka Stucki/Extender: Baldemar Friday Weeks in Treatment: 0 Edema Assessment Left: [Left: Right] [Right: :] Assessed: [Left: No] [Right: No] Edema: [Left: N] [Right: o] Calf Left: Right: Point of Measurement: 35 cm From Medial Instep 36 cm Ankle Left: Right: Point of Measurement: 10 cm From Medial Instep 23 cm Vascular Assessment Pulses: Dorsalis Pedis Doppler Audible: [Right:Yes] Posterior Tibial Doppler Audible: [Right:Yes] Extremity colors, hair growth, and conditions: Extremity Color: [Right:Normal] Hair Growth on Extremity: [Right:Yes] Temperature of Extremity: [Right:Warm] Capillary Refill: [Right:> 3 seconds] Blood Pressure: Brachial: [Right:112] Ankle: [Right:Dorsalis Pedis: 70 0.63] Toe Nail Assessment Left: Right: Thick: Yes Discolored: Yes Deformed: No Improper Length and Hygiene: No Electronic Signature(s) Signed: 02/10/2023 8:53:52 AM By: Angelina Pih Entered By: Angelina Pih on 02/10/2023 08:53:52 -------------------------------------------------------------------------------- Multi Wound Chart Details Patient Name: Date of Service: Dean King. 02/10/2023 8:15 A M Medical Record Number: [id number] Patient Account Number: [id number] Date of Birth/Sex: Treating RN: 07/09/1945 (77 y.o. Male) Angelina Pih Primary Care Padraic Marinos: Dewaine Oats Other Clinician: Referring Thierry Dobosz: Treating Magdeline Prange/Extender: Donette Larry in Treatment: 0 Vital Signs Height(in): 70 Pulse(bpm): 79 Weight(lbs): 167 Blood Pressure(mmHg): 112/67 Body Mass Index(BMI): 24 Temperature(F): 97.8 Respiratory Rate(breaths/min): 18 [1:Photos:] Dean King, Dean King ([id number]) [1:Photos:] [N/A:N/A] Right Amputation Site - Toe N/A N/A Wound  Location: Surgical Injury N/A N/A Wounding Event: Open Surgical Wound N/A N/A Primary Etiology: Arrhythmia, Congestive Heart Failure, N/A N/A Comorbid History: Coronary Artery Disease, Hypertension, Type II Diabetes, Osteomyelitis 01/16/2023 N/A N/A Date Acquired: 0 N/A N/A Weeks of Treatment: Open N/A N/A Wound Status: No N/A N/A Wound Recurrence: 2.5x2.8x1 N/A N/A Measurements L x W x King (cm) 5.498 N/A N/A A (cm) : rea 5.498 N/A N/A Volume (cm) : Full Thickness Without Exposed N/A N/A Classification: Support Structures Medium N/A N/A Exudate A mount: Serosanguineous N/A N/A Exudate Type: red, brown N/A N/A Exudate Color: Small (1-33%) N/A N/A Granulation A mount: Red, Pink N/A N/A Granulation Quality: Large (67-100%) N/A N/A Necrotic A mount: Fat Layer (Subcutaneous Tissue): Yes N/A N/A Exposed Structures: None N/A N/A Epithelialization: Debridement - Excisional N/A N/A Debridement: Pre-procedure Verification/Time Out 09:19 N/A N/A Taken: Lidocaine 4% T opical Solution N/A N/A Pain Control: Subcutaneous,  Slough N/A N/A Tissue Debrided: Skin/Subcutaneous Tissue N/A N/A Level: 5.49 N/A N/A Debridement A (sq cm): rea Curette, Nippers, Scissors, N/A N/A Instrument: Other(staple remover) Moderate N/A N/A Bleeding: Pressure N/A N/A Hemostasis Achieved: Debridement Treatment Response: Procedure was tolerated well N/A N/A Post Debridement Measurements L x 2.5x2.8x1 N/A N/A W x King (cm) 5.498 N/A N/A Post Debridement Volume: (cm) Debridement N/A N/A Procedures Performed: Treatment Notes Electronic Signature(s) Signed: 02/10/2023 11:11:36 AM By: Angelina Pih Entered By: Angelina Pih on 02/10/2023 11:11:35 -------------------------------------------------------------------------------- Multi-Disciplinary Care Plan Details Patient Name: Date of Service: Dean King. 02/10/2023 8:15 A M Medical Record Number: [id number] Patient Account  Number: [id number] Date of Birth/Sex: Treating RN: [birth year] (77 y.o. Male) Angelina Pih Primary Care Rodriques Badie: Dewaine Oats Other Clinician: Referring Sieara Bremer: Treating Alisen Marsiglia/Extender: Donette Larry in Treatment: 0 Dean King, Dean King ([id number]) [id number] Page 6 of 10 Active Inactive Necrotic Tissue Nursing Diagnoses: Impaired tissue integrity related to necrotic/devitalized tissue Knowledge deficit related to management of necrotic/devitalized tissue Goals: Necrotic/devitalized tissue will be minimized in the wound bed Date Initiated: 02/10/2023 Target Resolution Date: 04/07/2023 Goal Status: Active Patient/caregiver will verbalize understanding of reason and process for debridement of necrotic tissue Date Initiated: 02/10/2023 Date Inactivated: 02/10/2023 Target Resolution Date: 02/10/2023 Goal Status: Met Interventions: Assess patient pain level pre-, during and post procedure and prior to discharge Provide education on necrotic tissue and debridement process Treatment Activities: Apply topical anesthetic as ordered : 02/10/2023 Excisional debridement : 02/10/2023 Notes: Wound/Skin Impairment Nursing Diagnoses: Impaired tissue integrity Knowledge deficit related to ulceration/compromised skin integrity Goals: Ulcer/skin breakdown will have a volume reduction of 30% by week 4 Date Initiated: 02/10/2023 Target Resolution Date: 03/10/2023 Goal Status: Active Ulcer/skin breakdown will have a volume reduction of 50% by week 8 Date Initiated: 02/10/2023 Target Resolution Date: 04/07/2023 Goal Status: Active Ulcer/skin breakdown will have a volume reduction of 80% by week 12 Date Initiated: 02/10/2023 Target Resolution Date: 05/05/2023 Goal Status: Active Ulcer/skin breakdown will heal within 14 weeks Date Initiated: 02/10/2023 Target Resolution Date: 05/19/2023 Goal Status: Active Interventions: Assess patient/caregiver  ability to obtain necessary supplies Assess patient/caregiver ability to perform ulcer/skin care regimen upon admission and as needed Assess ulceration(s) every visit Provide education on ulcer and skin care Notes: HBO discussed with patient and spouse Electronic Signature(s) Signed: 02/10/2023 11:14:09 AM By: Angelina Pih Entered By: Angelina Pih on 02/10/2023 11:14:09 Pain Assessment Details -------------------------------------------------------------------------------- Dean King ([id number]) [id number] Page 7 of 10 Patient Name: Date of Service: Dean King, Dean King 02/10/2023 8:15 A M Medical Record Number: [id number] Patient Account Number: [id number] Date of Birth/Sex: Treating RN: [birth year] (77 y.o. Male) Angelina Pih Primary Care Loa Idler: Dewaine Oats Other Clinician: Referring Michaeljames Milnes: Treating Curtez Brallier/Extender: Donette Larry in Treatment: 0 Active Problems Location of Pain Severity and Description of Pain Patient Has Paino Yes Site Locations Rate the pain. Current Pain Level: 3 Pain Management and Medication Current Pain Management: Electronic Signature(s) Signed: 02/10/2023 4:09:45 PM By: Angelina Pih Entered By: Angelina Pih on 02/10/2023 08:23:17 -------------------------------------------------------------------------------- Patient/Caregiver Education Details Patient Name: Date of Service: Dean King 11/18/2024andnbsp8:15 A M Medical Record Number: [id number] Patient Account Number: [id number] Date of Birth/Gender: Treating RN: 05/17/1945 (77 y.o. Male) Angelina Pih Primary Care Physician: Dewaine Oats Other Clinician: Referring Physician: Treating Physician/Extender: Donette Larry in Treatment: 0 Education Assessment Education Provided To: Patient Education Topics Provided Hyperbaric Oxygenation: Handouts: Hyperbaric Oxygen Methods: Explain/Verbal Responses: State  content correctly OffloadingAMANTE, Dean King ([id number]) [id number]  Page 8 of 10 Handouts: How Offloading Helps Foot Wounds Heal Methods: Explain/Verbal Responses: State content correctly Welcome T [facility]New [facility] Packet: o Handouts: [facility] Pledge form, Welcome T [facility] o Methods: Explain/Verbal Responses: State content correctly Wound Debridement: Handouts: Wound Debridement Methods: Explain/Verbal Responses: State content correctly Wound/Skin Impairment: Handouts: Caring for Your Ulcer Methods: Explain/Verbal Responses: State content correctly Electronic Signature(s) Signed: 02/10/2023 4:09:45 PM By: Angelina Pih Entered By: Angelina Pih on 02/10/2023 11:14:13 -------------------------------------------------------------------------------- Wound Assessment Details Patient Name: Date of Service: Dean King. 02/10/2023 8:15 A M Medical Record Number: [id number] Patient Account Number: [id number] Date of Birth/Sex: Treating RN: [birth year] (77 y.o. 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 Width: (cm) 2. Depth: (cm) 1 Dean King, Dean King ([id number]) Area: (cm) Volume: (cm) 5 % Reduction in Area: 8 % Reduction in Volume: Epithelialization: None [id number] Page 9 of 10 5.498 Tunneling:  No 5.498 Undermining: No Wound Description Classification: Grade 3 Exudate Amount: Medium Exudate Type: Serosanguineous Exudate Color: red, brown Foul Odor After Cleansing: No Slough/Fibrino Yes Wound Bed Granulation Amount: Small (1-33%) Exposed Structure Granulation Quality: Red, Pink Fat Layer (Subcutaneous Tissue) Exposed: Yes Necrotic Amount: Large (67-100%) Treatment Notes Wound #1 (Amputation Site - Toe) Wound Laterality: Right Cleanser [place] Ancillary Kit - 15 Day Supply Discharge Instruction: Use supplies as instructed; Kit contains: (15) Saline Bullets; (15) 3x3 Gauze; 15 pr Gloves Soap and Water Discharge Instruction: Gently cleanse wound with antibacterial soap, rinse and pat dry prior to dressing wounds Wound Cleanser Discharge Instruction: Wash your hands with soap and water. Remove old dressing, discard into plastic bag and place into trash. Cleanse the wound with Wound Cleanser prior to applying a clean dressing using gauze sponges, not tissues or cotton balls. Do not scrub or use excessive force. Pat dry using gauze sponges, not tissue or cotton balls. Peri-Wound Care Topical Primary Dressing IODOFLEX 0.9% Cadexomer Iodine Pad Discharge Instruction: Apply Iodoflex to wound bed only as directed. Secondary Dressing (BORDER) Zetuvit Plus SILICONE BORDER Dressing 4x4 (in/in) Discharge Instruction: Please do not put silicone bordered dressings under wraps. Use non-bordered dressing only. Secured With [employer], Latex-free, Size 5, Small-Head / Shoulder / Thigh Compression Wrap Compression Stockings Add-Ons Electronic Signature(s) Signed: 02/13/2023 11:41:04 AM By: [name] PA-C Signed: 02/13/2023 4:35:52 PM By: Angelina Pih Previous Signature: 02/10/2023 1:22:51 PM Version By: Angelina Pih Entered By: [name] on 02/13/2023 11:41:04 -------------------------------------------------------------------------------- Vitals Details Patient  Name: Date of Service: Dean King. 02/10/2023 8:15 A M Medical Record Number: [id number] Patient Account Number: [id number] Date of Birth/Sex: Treating RN: 1946/03/06 (77 y.o. Male) Angelina Pih Primary Care Amand Lemoine: Dewaine Oats Other Clinician: Referring Rickita Forstner: Treating Delmos Velaquez/Extender: Baldemar Friday [place], Jillyn Hidden King ([id number]) [id number] Page 10 of 10 Weeks in Treatment: 0 Vital Signs Time Taken: 08:20 Temperature (F): 97.8 Height (in): 70 Pulse (bpm): 79 Source: Stated Respiratory Rate (breaths/min): 18 Weight (lbs): 167 Blood Pressure (mmHg): 112/67 Source: Stated Reference Range: 80 - 120 mg / dl Body Mass Index (BMI): 24 Electronic  Signature(s) Signed: 02/10/2023 4:09:45 PM By: Angelina Pih Entered By: Angelina Pih on 02/10/2023 08:23:41

## 2023-02-10 NOTE — Progress Notes (Signed)
[name], [name] ([id number]) [id number] Page 1 of 1 Visit Report for 02/10/2023 HBO Patient Questionnaire Details Patient Name: Date of Service: Dean King, Dean King 02/10/2023 8:15 A M Medical Record Number: [id number] Patient Account Number: [id number] Date of Birth/Sex: Treating RN: 1946-02-06 (77 y.o. Male) Angelina Pih Primary Care Jaclynne Baldo: Dewaine Oats Other Clinician: Referring Dequane Strahan: Treating Tranquilino Fischler/Extender: Donette Larry in Treatment: 0 The following information was scribed by: Angelina Pih The following information was scribed for: [name] HBO Patient Questionnaire Items Answer A "Yes" answers must be brought to the hyperbaric physician's attention. ny Breathing or Lung problemso No Currently use tobacco productso No Used tobacco products in the pasto Yes Heart problemso Yes Seen a doctor for any heart or blood pressure problemso Yes If yes, provide the name of doctor: [facility] Do you take water pills (diuretic)o Yes If yes, last time taken: lasix every morning Diabeteso Yes On Diabetes pillo Yes On Insulino No Dialysiso No Eye problems like glaucomao No Ear problems or surgeryo Yes Sinus Problemso No Cancero Yes List the location: skin/face Surgery(s) for cancero Yes Describe the surgery (s): site removal Radiation therapy for cancero No Chemotherapy for cancero No Confinement Anxiety (Claustrophobia- fear of confined places)o No Any medical implants/devices that are fully or partially implanted or attached to your bodyo Yes Pregnanto No Seizureso No Notes defibrillator in place Electronic Signature(s) Signed: 02/10/2023 4:09:45 PM By: Angelina Pih Entered By: Angelina Pih on 02/10/2023 06:56:04

## 2023-02-11 NOTE — Progress Notes (Signed)
CHADERICK, NABA King ([id number]) [id number] Page 1 of 11 Visit Report for 02/10/2023 Chief Complaint Document Details Patient Name: Date of Service: Dean King, Dean King 02/10/2023 8:15 A M Medical Record Number: [id number] Patient Account Number: [id number] Date of Birth/Sex: Treating RN: 16-Mar-1946 (77 y.o. 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 02/10/2023 8:15 A M Medical Record Number: [id number] Patient Account Number: [id number] Date of Birth/Sex: Treating RN: 01-16-1946 (77 y.o. Male) Angelina Pih Primary Care Provider: Dewaine Oats Other Clinician: Referring Provider: Treating Provider/Extender: Donette Larry in Treatment: 0 Debridement Performed for Assessment: Wound #1 Right Amputation Site - Toe Performed By: Physician [name], PA-C The following information was scribed by: Angelina Pih The information was scribed for: [name] Debridement Type: Debridement Level of Consciousness (Pre-procedure): Awake and Alert Pre-procedure Verification/Time Out Yes - 09:19 Taken: Pain Control: Lidocaine 4% T opical Solution Percent of Wound Bed Debrided: 100% T Area Debrided (cm): otal 5.49 Tissue and other material debrided: Viable, Non-Viable, Slough, Subcutaneous, Slough Level: Skin/Subcutaneous Tissue Debridement Description: Excisional Instrument: Curette, Nippers, Scissors, Other : staple remover Bleeding: Moderate Hemostasis  Achieved: Pressure Response to Treatment: Procedure was tolerated well Level of Consciousness Arlie SolomonsNAKI, WAX King ([id number]) [id number] Page 2 of 11 Level of Consciousness (Post- Awake and Alert procedure): Post Debridement Measurements of Total Wound Length: (cm) 2.5 Width: (cm) 2.8 Depth: (cm) 1 Volume: (cm) 5.498 Character of Wound/Ulcer Post Debridement: Stable Post Procedure Diagnosis Same as Pre-procedure Notes 1 staple removed Electronic Signature(s) Signed: 02/10/2023 4:09:45 PM By: Angelina Pih Signed: 02/11/2023 4:00:09 PM By: [name] PA-C Entered By: Angelina Pih on 02/10/2023 09:29:59 -------------------------------------------------------------------------------- HPI Details Patient Name: Date of Service: Dean Croak King. 02/10/2023 8:15 A M Medical Record Number: [id number] Patient Account Number: [id number] Date of Birth/Sex: Treating RN: 25-Jun-1945 (77 y.o. Male) Angelina Pih Primary Care Provider: Dewaine Oats Other Clinician: Referring Provider: Treating Provider/Extender: Donette Larry in Treatment: 0 History of Present Illness HPI Description: Epic Notes: The right common femoral is widely patent as is the profunda femoris. The SFA and popliteal have been stented in the past and the stents are widely patent. The trifurcation is diseased with occlusion of the anterior tibial artery, at the very distalmost aspect of the tibioperoneal trunk there is a greater than 90% stenosis which obstructs flow to the peroneal and posterior tibial arteries. Then approximately 3 to 5 cm distal to this lesion there is a greater than 70% stenosis in the posterior tibial. Lastly, as described above in the distalmost aspect of the posterior tibial and the plantar artery there is a diffuse greater than 80% stenosis. Following angioplasty the right posterior tibial now is now widely patent including the distal plantar  branch and demonstrates in-line flow with less than 20% residual stenosis. It is much improved and looks quite nice. Angioplasty and stent placement of the right external iliac artery also yields an excellent result with less than 10% residual stenosis. Summary: Successful recanalization right lower extremity for limb salvage Admission to Wound Care Center: 02-10-2023 upon evaluation today patient appears to be doing somewhat poorly in regard to a wound that occurred as a result of  initially a callus this was back in around March. Subsequently the patient tells me that he following this time in March ended up with an open wound he was seeing Dr. Allena Katz and slowly this open wound got worse to the point that he actually ended up having to go for surgery this was a ray amputation and that actually occurred on January 16, 2023. The patient is also been revascularized to have that note above which was reviewed in epic. It does appear that he had 90% stenosis of the peroneal and posterior tibial arteries after intervention this was a less than 10% residual stenosis he did have balloon angioplasty and stent. The patient's most recent hemoglobin A1c was 7.8 on 01-14-2023. 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 I did review labs from the patient's transplant doctor as he is a liver transplant patient did reveal that his CMP was essentially okay with no major complications he did have chronic kidney disease noted with a GFR of 54 subsequently when looking at his white blood cell count it was actually elevated at 11.7 hemoglobin was somewhat low at 11.1 also did subsequently check out as well his  MRI which showed that he had had an osteomyelitis of the fifth metatarsal head and into the proximal phalanx. He had septic arthritis as well. Subsequently this was an MRI that was prior to the amputation on 01-15-2023 amputation which was a ray amputation was undertaken the next day. Dean King, Dean King ([id number]) [id number] Page 3 of 11 Electronic Signature(s) Signed: 02/10/2023 4:55:44 PM By: [name] PA-C Entered By: [name] on 02/10/2023 16:55:44 -------------------------------------------------------------------------------- Physical Exam Details Patient Name: Date of Service: Dean King, Dean King 02/10/2023 8:15 A M Medical Record Number: [id number] Patient Account Number: [id number] Date of Birth/Sex: Treating RN: 1946/01/27 (77 y.o. Male) Angelina Pih Primary Care Provider: Dewaine Oats Other Clinician: Referring Provider: Treating Provider/Extender: Donette Larry in Treatment: 0 Constitutional sitting or standing blood pressure is within target range for patient.. pulse regular and within target range for patient.Marland Kitchen respirations regular, non-labored and within target range for patient.Marland Kitchen temperature within target range for patient.. Well-nourished and well-hydrated in no acute distress. Eyes conjunctiva clear no eyelid edema noted. pupils equal round and reactive to light and accommodation. Ears, Nose, Mouth, and Throat no gross abnormality of ear auricles or external auditory canals. normal hearing noted during conversation. mucus membranes moist. Respiratory normal breathing without difficulty. Cardiovascular 1+ dorsalis pedis/posterior tibialis pulses. no clubbing, cyanosis, significant edema, <3 sec cap refill. Musculoskeletal normal gait and posture. no significant deformity or arthritic changes, no loss or range of motion, no clubbing. Psychiatric this patient is able to make decisions and demonstrates good insight into  disease process. Alert and Oriented x 3. pleasant and cooperative. Notes Upon inspection patient's wound unfortunately has significant amount of necrotic tissue. I am going to have to perform some debridement here today and I discussed this with the patient. He actually tolerated the debridement well without complication and postdebridement the wound bed actually appears to be doing much better which is great news. With that being said I still think potentially he could be a hyperbaric candidate. I think with the longstanding history  what has been going on here anything we can do to get this healed as quickly as possible would be ideal. Electronic Signature(s) Signed: 02/10/2023 4:56:41 PM By: [name] PA-C Entered By: [name] on 02/10/2023 16:56:41 -------------------------------------------------------------------------------- Physician Orders Details Patient Name: Date of Service: Dean Croak King. 02/10/2023 8:15 A Joretta Bachelor, Jillyn Hidden King ([id number]) [id number] Page 4 of 11 Medical Record Number: [id number] Patient Account Number: [id number] Date of Birth/Sex: Treating RN: 1945/06/13 (77 y.o. 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 Bathing/ Shower/ Hygiene May shower with wound dressing protected with water repellent cover or cast protector. No tub bath. Anesthetic (Use 'Patient Medications' Section for Anesthetic Order Entry) Lidocaine applied to wound bed Off-Loading Open toe surgical shoe - front off loader Hyperbaric Oxygen Therapy Wound #1 Right Amputation Site - Toe Other - discussed with patient at his new pt appointment Wound Treatment Wound #1 - Amputation Site - Toe Wound Laterality: Right Cleanser: Byram Ancillary Kit - 15 Day Supply (DME) (Generic) 3 x Per Week/15 Days Discharge Instructions: Use supplies as instructed; Kit contains: (15) Saline Bullets; (15) 3x3 Gauze; 15 pr Gloves Cleanser: Soap and Water 3 x Per Week/15 Days Discharge Instructions: Gently cleanse wound with antibacterial soap, rinse and pat dry prior to dressing wounds Cleanser: Wound Cleanser 3 x Per Week/15 Days Discharge Instructions: Wash your hands with soap and water. Remove old dressing, discard into plastic bag and place into trash. Cleanse the wound with Wound Cleanser prior to applying a clean dressing using gauze sponges, not tissues or cotton balls. Do not scrub or use excessive force. Pat dry using gauze sponges, not tissue or cotton balls. Prim Dressing: IODOFLEX 0.9% Cadexomer Iodine Pad (DME) (Generic) 3 x Per Week/15 Days ary Discharge Instructions: Apply Iodoflex to wound bed only as directed. Secondary Dressing: (BORDER) Zetuvit Plus SILICONE BORDER Dressing 4x4 (in/in) (DME) (Generic) 3 x Per Week/15 Days Discharge Instructions: Please do not put silicone bordered dressings under wraps. Use non-bordered dressing only. Secured With: [employer], Latex-free, Size 5, Small-Head / Shoulder / Thigh 3 x Per Week/15  Days Laboratory Bacteria identified in Wound by Culture (MICRO) - right foot amputation site PCR culture completed LOINC Code: 6462-6 Convenience Name: Wound culture routine Electronic Signature(s) Signed: 02/10/2023 4:09:45 PM By: Angelina Pih Signed: 02/11/2023 4:00:09 PM By: [name] PA-C Entered By: Angelina Pih on 02/10/2023 11:12:53 Dean King ([id number]) [id number] Page 5 of 11 -------------------------------------------------------------------------------- Problem List Details Patient Name: Date of Service: MEAD, CHEA 02/10/2023 8:15 A M Medical Record Number: [id number] Patient Account Number: [id number] Date of Birth/Sex: Treating RN: October 30, 1945 (77 y.o. 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, right ankle and foot 02/10/2023 No Yes T81.31XA Disruption of external operation (surgical) wound, not elsewhere classified, 02/10/2023 No Yes initial encounter E11.621 Type 2 diabetes mellitus with foot ulcer 02/10/2023 No Yes L97.512 Non-pressure chronic ulcer of other part of right foot with fat layer exposed 02/10/2023 No Yes I25.10 Atherosclerotic heart disease of native coronary artery without angina pectoris 02/10/2023 No Yes I50.42 Chronic combined systolic (congestive) and diastolic (congestive) heart failure 02/10/2023 No Yes I48.0 Paroxysmal atrial fibrillation 02/10/2023 No Yes Z79.01 Long term (current) use of anticoagulants 02/10/2023 No Yes Inactive Problems Resolved Problems Electronic Signature(s) Signed: 02/10/2023 9:08:56 AM By: [name] PA-C Entered By: [name] on 02/10/2023 09:08:55 Dean King ([id number]) [id number] Page 6 of  11 -------------------------------------------------------------------------------- Progress Note Details Patient Name: Date of Service: Dean King, Dean King 02/10/2023 8:15 A M Medical Record Number: [id number] Patient Account Number: [id number] Date of Birth/Sex: Treating RN: 1946/02/19 (77 y.o. 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 The SFA and popliteal have been stented in the past and the stents are widely patent. The trifurcation is diseased with occlusion of the anterior tibial artery, at the very distalmost aspect of the tibioperoneal trunk there is a greater than 90% stenosis which obstructs flow to the peroneal and posterior tibial arteries. Then approximately 3 to 5 cm distal to this lesion there is a greater than 70% stenosis in the posterior tibial. Lastly, as described above in the distalmost aspect of the posterior tibial and the plantar artery there is a diffuse greater than 80% stenosis. Following angioplasty the right posterior tibial now is now widely patent including the distal plantar branch and demonstrates in-line flow with less than 20% residual stenosis. It is much improved and looks quite nice. Angioplasty and stent placement of the right external iliac artery also yields an excellent result with less than 10% residual stenosis. Summary: Successful recanalization right lower extremity for limb salvage Admission to Wound Care Center: 02-10-2023 upon evaluation today patient appears to be doing somewhat poorly in regard to a wound that occurred as a result of initially a callus this was back in around March.  Subsequently the patient tells me that he following this time in March ended up with an open wound he was seeing Dr. Allena Katz and slowly this open wound got worse to the point that he actually ended up having to go for surgery this was a ray amputation and that actually occurred on January 16, 2023. The patient is also been revascularized to have that note above which was reviewed in epic. It does appear that he had 90% stenosis of the peroneal and posterior tibial arteries after intervention this was a less than 10% residual stenosis he did have balloon angioplasty and stent. The patient's most recent hemoglobin A1c was 7.8 on 01-14-2023. 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 I did review labs from the patient's  transplant doctor as he is a liver transplant patient did reveal that his CMP was essentially okay with no major complications he did have chronic kidney disease noted with a GFR of 54 subsequently when looking at his white blood cell count it was actually elevated at 11.7 hemoglobin was somewhat low at 11.1 also did subsequently check out as well his MRI which showed that he had had an osteomyelitis of the fifth metatarsal head and into the proximal phalanx. He had septic arthritis as well. Subsequently this was an MRI that was prior to the amputation on 01-15-2023 amputation which was a ray amputation was undertaken the next day. Patient History Information obtained from Patient, Chart. Allergies Levaquin Social History Former smoker - cigars, Alcohol Use - Never - not since 2004, Drug Use - No History, Caffeine Use - Moderate - diet coke. Medical  History Cardiovascular Patient has history of Arrhythmia - a fib, Congestive Heart Failure, Coronary Artery Disease, Hypertension Endocrine Patient has history of Type II Diabetes Musculoskeletal Patient has history of Osteomyelitis - right foot Patient is treated with Oral Agents. Blood sugar is not tested. Medical A Surgical History Notes nd Cardiovascular TIA, cardiomyopathy Dean King, Dean ([id number]) [phone].pdf Page 7 of 11 Review of Systems (ROS) Constitutional Symptoms (General Health) Denies complaints or symptoms of Fatigue, Fever, Chills, Marked Weight Change. Eyes Denies complaints or symptoms of Dry Eyes, Vision Changes, Glasses / Contacts. Ear/Nose/Mouth/Throat Denies complaints or symptoms of Difficult clearing ears, Sinusitis. Hematologic/Lymphatic Denies complaints or symptoms of Bleeding / Clotting Disorders, Human Immunodeficiency Virus. Respiratory Denies complaints or symptoms of Chronic or frequent coughs, Shortness of Breath. Gastrointestinal Denies complaints or symptoms of Frequent diarrhea, Nausea, Vomiting. Genitourinary Denies complaints or symptoms of Kidney failure/ Dialysis, Incontinence/dribbling. Immunological Denies complaints or symptoms of Hives, Itching. Integumentary (Skin) Complains or has symptoms of Wounds, Breakdown, Swelling, cellulitis Neurologic Denies complaints or symptoms of Numbness/parasthesias, Focal/Weakness. Oncologic skin CA Psychiatric Denies complaints or symptoms of Anxiety, Claustrophobia. General Notes: hx liver transplant, defibrillator in place Objective Constitutional sitting or standing blood pressure is within target range for patient.. pulse regular and within target range for patient.Marland Kitchen respirations regular, non-labored and within target range for patient.Marland Kitchen temperature within target range for patient.. Well-nourished and well-hydrated in no acute distress. Vitals Time Taken: 8:20 AM,  Height: 70 in, Source: Stated, Weight: 167 lbs, Source: Stated, BMI: 24, Temperature: 97.8 F, Pulse: 79 bpm, Respiratory Rate: 18 breaths/min, Blood Pressure: 112/67 mmHg. Eyes conjunctiva clear no eyelid edema noted. pupils equal round and reactive to light and accommodation. Ears, Nose, Mouth, and Throat no gross abnormality of ear auricles or external auditory canals. normal hearing noted during conversation. mucus membranes moist. Respiratory normal breathing without difficulty. Cardiovascular 1+ dorsalis pedis/posterior tibialis pulses. no clubbing, cyanosis, significant edema, Musculoskeletal normal gait and posture. no significant deformity or arthritic changes, no loss or range of motion, no clubbing. Psychiatric this patient is able to make decisions and demonstrates good insight into disease process. Alert and Oriented x 3. pleasant and cooperative. General Notes: Upon inspection patient's wound unfortunately has significant amount of necrotic tissue. I am going to have to perform some debridement here today and I discussed this with the patient. He actually tolerated the debridement well without complication and postdebridement the wound bed actually appears to be doing much better which is great news. With that being said I still think potentially he could be a hyperbaric candidate. I think with the longstanding history what has been going on here anything we  can do to get this healed as quickly as possible would be ideal. Integumentary (Hair, Skin) Wound #1 status is Open. Original cause of wound was Surgical Injury. The date acquired was: 01/16/2023. The wound is located on the Right Amputation Site - T The wound measures 2.5cm length x 2.8cm width x 1cm depth; 5.498cm^2 area and 5.498cm^3 volume. There is Fat Layer (Subcutaneous Tissue) oe. exposed. There is no tunneling or undermining noted. There is a medium amount of serosanguineous drainage noted. There is small (1-33%) red,  pink granulation within the wound bed. There is a large (67-100%) amount of necrotic tissue within the wound bed. Assessment Active Problems ICD-10 Chronic multifocal osteomyelitis, right ankle and foot Disruption of external operation (surgical) wound, not elsewhere classified, initial encounter Type 2 diabetes mellitus with foot ulcer Non-pressure chronic ulcer of other part of right foot with fat layer exposed Atherosclerotic heart disease of native coronary artery without angina pectoris Chronic combined systolic (congestive) and diastolic (congestive) heart failure Paroxysmal atrial fibrillation Long term (current) use of anticoagulants Dean King, Dean King ([id number]) [id number] Page 8 of 11 Procedures Wound #1 Pre-procedure diagnosis of Wound #1 is an Open Surgical Wound located on the Right Amputation Site - T . There was a Excisional Skin/Subcutaneous oe Tissue Debridement with a total area of 5.49 sq cm performed by [name], PA-C. With the following instrument(s): Curette, Nippers, Scissors, staple remover to remove Viable and Non-Viable tissue/material. Material removed includes Subcutaneous Tissue and Slough and after achieving pain control using Lidocaine 4% Topical Solution. No specimens were taken. A time out was conducted at 09:19, prior to the start of the procedure. A Moderate amount of bleeding was controlled with Pressure. The procedure was tolerated well. Post Debridement Measurements: 2.5cm length x 2.8cm width x 1cm depth; 5.498cm^3 volume. Character of Wound/Ulcer Post Debridement is stable. Post procedure Diagnosis Wound #1: Same as Pre-Procedure General Notes: 1 staple removed. Plan Follow-up Appointments: Return Appointment in 1 week. Bathing/ Shower/ Hygiene: May shower with wound dressing protected with water repellent cover or cast protector. No tub bath. Anesthetic (Use 'Patient Medications' Section for Anesthetic Order  Entry): Lidocaine applied to wound bed Off-Loading: Open toe surgical shoe - front off loader Hyperbaric Oxygen Therapy: Wound #1 Right Amputation Site - T oe: Other - discussed with patient at his new pt appointment Laboratory ordered were: Wound culture routine - right foot amputation site PCR culture completed WOUND #1: - Amputation Site - T oe Wound Laterality: Right Cleanser: [employer]ram Ancillary Kit - 15 Day Supply (DME) (Generic) 3 x Per Week/15 Days Discharge Instructions: Use supplies as instructed; Kit contains: (15) Saline Bullets; (15) 3x3 Gauze; 15 pr Gloves Cleanser: Soap and Water 3 x Per Week/15 Days Discharge Instructions: Gently cleanse wound with antibacterial soap, rinse and pat dry prior to dressing wounds Cleanser: Wound Cleanser 3 x Per Week/15 Days Discharge Instructions: Wash your hands with soap and water. Remove old dressing, discard into plastic bag and place into trash. Cleanse the wound with Wound Cleanser prior to applying a clean dressing using gauze sponges, not tissues or cotton balls. Do not scrub or use excessive force. Pat dry using gauze sponges, not tissue or cotton balls. Prim Dressing: IODOFLEX 0.9% Cadexomer Iodine Pad (DME) (Generic) 3 x Per Week/15 Days ary Discharge Instructions: Apply Iodoflex to wound bed only as directed. Secondary Dressing: (BORDER) Zetuvit Plus SILICONE BORDER Dressing 4x4 (in/in) (DME) (Generic) 3 x Per Week/15 Days Discharge Instructions: Please do not put silicone bordered dressings under wraps.  Use non-bordered dressing only. Secured With: [employer], Latex-free, Size 5, Small-Head / Shoulder / Thigh 3 x Per Week/15 Days 1. Based on what I am seeing I was actually looking toward trying to get the patient started for hyperbarics. Unfortunately upon evaluation of the medical record it was found that the patient does not have an ejection fraction consistent with being able to proceed with hyperbarics. He had a EF of  20% which means that he is not a hyperbaric candidate at this point. We need him to be compensated and at least 30%. 2. I am would recommend at this time that we continue with the Iodoflex. 3. I am also going to recommend we continue with a bordered foam dressing. Stretch net just to make sure the dressing does not come off accidentally. We will see patient back for reevaluation in 1 week here in the clinic. If anything worsens or changes patient will contact our office for additional recommendations. Electronic Signature(s) Signed: 02/10/2023 4:57:13 PM By: [name] PA-C Entered By: [name] on 02/10/2023 16:57:12 Dean King ([id number]) [id number] Page 9 of 11 -------------------------------------------------------------------------------- ROS/PFSH Details Patient Name: Date of Service: Dean King, Dean King 02/10/2023 8:15 A M Medical Record Number: [id number] Patient Account Number: [id number] Date of Birth/Sex: Treating RN: 03-06-1946 (77 y.o. Male) Angelina Pih Primary Care Provider: Dewaine Oats Other Clinician: Referring Provider: Treating Provider/Extender: Donette Larry in Treatment: 0 Information Obtained From Patient Chart Constitutional Symptoms (General Health) Complaints and Symptoms: Negative for: Fatigue; Fever; Chills; Marked Weight Change Eyes Complaints and Symptoms: Negative for: Dry Eyes; Vision Changes; Glasses / Contacts Ear/Nose/Mouth/Throat Complaints and Symptoms: Negative for: Difficult clearing ears; Sinusitis Hematologic/Lymphatic Complaints and Symptoms: Negative for: Bleeding / Clotting Disorders; Human Immunodeficiency Virus Respiratory Complaints and Symptoms: Negative for: Chronic or frequent coughs; Shortness of Breath Gastrointestinal Complaints and Symptoms: Negative for: Frequent diarrhea; Nausea; Vomiting Genitourinary Complaints and Symptoms: Negative for: Kidney failure/ Dialysis;  Incontinence/dribbling Immunological Complaints and Symptoms: Negative for: Hives; Itching Integumentary (Skin) Complaints and Symptoms: Positive for: Wounds; Breakdown; Swelling Review of System Notes: cellulitis Neurologic Complaints and Symptoms: Negative for: Numbness/parasthesias; Focal/Weakness Psychiatric Complaints and Symptoms: Negative for: Anxiety; Claustrophobia Cardiovascular Medical History: Positive for: Arrhythmia - a fib; Congestive Heart Failure; Coronary Artery Disease; Hypertension Past Medical History Notes: TIA, cardiomyopathy Endocrine RANARDO, MUTCH King ([id number]) [id number] Page 10 of 11 Medical History: Positive for: Type II Diabetes Time with diabetes: 5 years Treated with: Oral agents Blood sugar tested every day: No Musculoskeletal Medical History: Positive for: Osteomyelitis - right foot Oncologic Complaints and Symptoms: Review of System Notes: skin CA Immunizations Pneumococcal Vaccine: Received Pneumococcal Vaccination: Yes Received Pneumococcal Vaccination On or After 60th Birthday: Yes Implantable Devices Yes Family and Social History Former smoker - cigars; Alcohol Use: Never - not since 2004; Drug Use: No History; Caffeine Use: Moderate - diet coke Notes hx liver transplant, 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 02/10/2023 Medical Record Number: [id number] Patient Account Number: [id number] Date of Birth/Sex: Treating RN: 03-18-1946 (77 y.o. Male) Angelina Pih Primary Care Provider: Dewaine Oats Other Clinician: Referring Provider: Treating Provider/Extender: Donette Larry in  Treatment: 0 Diagnosis Coding ICD-10 Codes Code Description [phone] Chronic multifocal osteomyelitis, right ankle and foot T81.31XA Disruption of external operation (surgical) wound, not elsewhere classified, initial  encounter E11.621 Type 2 diabetes mellitus with foot ulcer L97.512 Non-pressure chronic ulcer of other part of right foot with fat layer exposed I25.10 Atherosclerotic heart disease of native coronary artery without angina pectoris I50.42 Chronic combined systolic (congestive) and diastolic (congestive) heart failure I48.0 Paroxysmal atrial fibrillation Z79.01 Long term (current) use of anticoagulants Dean King, Dean King ([id number]) [id number] Page 11 of 11 Facility Procedures : CPT4 Code: [id number] Description: 99213 - WOUND CARE VISIT-LEV 3 EST PT Modifier: Quantity: 1 : CPT4 Code: [id number] Description: 11042 - DEB SUBQ TISSUE 20 SQ CM/< ICD-10 Diagnosis Description L97.512 Non-pressure chronic ulcer of other part of right foot with fat layer exposed Modifier: Quantity: 1 Physician Procedures : CPT4 Code Description Modifier [id number] 99204 - WC PHYS LEVEL 4 - NEW PT 25 ICD-10 Diagnosis Description M86.371 Chronic multifocal osteomyelitis, right ankle and foot T81.31XA Disruption of external operation (surgical) wound, not elsewhere classified,  initial encounter E11.621 Type 2 diabetes mellitus with foot ulcer L97.512 Non-pressure chronic ulcer of other part of right foot with fat layer exposed Quantity: 1 : 11042 11042 - WC PHYS SUBQ TISS 20 SQ CM ICD-10 Diagnosis Description L97.512 Non-pressure chronic ulcer of other part of right foot with fat layer exposed Quantity: 1 Electronic Signature(s) Signed: 02/10/2023 4:59:27 PM By: [name] PA-C Entered By: [name] on 02/10/2023 16:59:27

## 2023-02-17 ENCOUNTER — Encounter: Payer: Medicare Other | Admitting: Physician Assistant

## 2023-02-17 DIAGNOSIS — I130 Hypertensive heart and chronic kidney disease with heart failure and stage 1 through stage 4 chronic kidney disease, or unspecified chronic kidney disease: Secondary | ICD-10-CM | POA: Diagnosis not present

## 2023-02-17 NOTE — Progress Notes (Addendum)
BRANCH, PELLY ([id number]) [id number] Page 1 of 8 Visit Report for 02/17/2023 Chief Complaint Document Details Patient Name: Date of Service: Dean King, Dean King 02/17/2023 9:45 A M Medical Record Number: [id number] Patient Account Number: [id number] Date of Birth/Sex: Treating RN: [birth year] (77 y.o. 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 02/17/2023 9:45 A M Medical Record Number: [id number] Patient Account Number: [id number] Date of Birth/Sex: Treating RN: [birth year] (77 y.o. [name] Primary Care Provider: Dewaine Oats Other Clinician: Referring Provider: Treating Provider/Extender: Dennie Bible Weeks in Treatment: 1 Debridement Performed for Assessment: Wound #1 Right Amputation Site - Toe Performed By: Physician [name], PA-C The following information was scribed by: Angelina Pih The information was scribed for: [name] Debridement Type: Debridement Severity of Tissue Pre Debridement: Fat layer exposed Level of Consciousness (Pre-procedure): Awake and Alert Pre-procedure Verification/Time Out Yes - 10:37 Taken: Pain Control: Lidocaine 4% T opical Solution Percent of Wound Bed Debrided: 100% T Area Debrided (cm): otal 6.87 Tissue and other material debrided: Viable, Non-Viable, Slough, Subcutaneous, Biofilm, Slough Level: Skin/Subcutaneous Tissue Debridement Description: Excisional Instrument: Curette Bleeding:  Moderate Hemostasis Achieved: Silver Nitrate Dean King, Dean King ([id number]) [id number] Page 2 of 8 Response to Treatment: Procedure was tolerated well Level of Consciousness (Post- Awake and Alert procedure): Post Debridement Measurements of Total Wound Length: (cm) 2.5 Width: (cm) 3.5 Depth: (cm) 0.7 Volume: (cm) 4.811 Character of Wound/Ulcer Post Debridement: Stable Severity of Tissue Post Debridement: Fat layer exposed Post Procedure Diagnosis Same as Pre-procedure Notes 1 stick silver nitrate used Electronic Signature(s) Signed: 02/17/2023 12:26:22 PM By: Angelina Pih Signed: 02/17/2023 3:42:23 PM By: [name] PA-C Entered By: Angelina Pih on 02/17/2023 62:95:28 -------------------------------------------------------------------------------- HPI Details Patient Name: Date of Service: Dean Croak King. 02/17/2023 9:45 A M Medical Record Number: [id number] Patient Account Number: [id number] Date of Birth/Sex: Treating RN: 18-May-1945 (77 y.o. [name] Primary Care Provider: Dewaine Oats Other Clinician: Referring Provider: Treating Provider/Extender: Dennie Bible Weeks in Treatment: 1 History of Present Illness HPI Description: Epic Notes: The right common femoral is widely patent as is the profunda femoris. The SFA and popliteal have been stented in the past and the stents are widely patent. The trifurcation is diseased with occlusion of the anterior tibial artery, at the very distalmost aspect of the tibioperoneal trunk there is a greater than 90% stenosis which obstructs flow to the peroneal and posterior tibial arteries. Then approximately 3 to 5 cm distal to this lesion there is a greater than 70% stenosis in the posterior tibial. Lastly, as described above in the distalmost aspect of the posterior tibial and the plantar artery there is a diffuse greater than 80% stenosis. Following angioplasty the right posterior  tibial now is now widely patent including the distal plantar branch and demonstrates in-line flow with less than 20% residual stenosis. It is much improved and looks quite nice. Angioplasty and stent placement of the right external iliac artery also yields an excellent result with less than 10% residual stenosis. Summary: Successful recanalization right lower extremity for limb salvage Admission to Wound Care Center: 02-10-2023 upon evaluation today patient appears to be doing somewhat poorly in  regard to a wound that occurred as a result of initially a callus this was back in around March. Subsequently the patient tells me that he following this time in March ended up with an open wound he was seeing Dr. Allena Katz and slowly this open wound got worse to the point that he actually ended up having to go for surgery this was a ray amputation and that actually occurred on January 16, 2023. The patient is also been revascularized to have that note above which was reviewed in epic. It does appear that he had 90% stenosis of the peroneal and posterior tibial arteries after intervention this was a less than 10% residual stenosis he did have balloon angioplasty and stent. The patient's most recent hemoglobin A1c was 7.8 on 01-14-2023. 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 I did review labs from the patient's transplant doctor as he is a liver transplant patient did reveal that his CMP was essentially okay with no major complications he did have chronic kidney disease noted with a GFR of 54 subsequently when looking at his white blood cell count it was actually elevated at 11.7 hemoglobin was  somewhat low at 11.1 also did subsequently check out as well his MRI which showed that he had had an osteomyelitis of the fifth metatarsal head and into the proximal phalanx. He had septic arthritis as well. Subsequently this was an MRI that was prior to the amputation on 01-15-2023 amputation which was a ray Dean King, Dean King ([id number]) [id number] Page 3 of 8 amputation was undertaken the next day. 02-17-2023 upon evaluation today patient appears to be doing well currently in regard to his wound which all things considered is actually doing quite well. Fortunately I do not see any signs of active infection at this time systemically locally he did test positive for Pseudomonas unfortunately the only oral medication for this is Levaquin he is allergic to Levaquin. For that reason we are going to actually continue with the recommendation currently for the topical Iodoflex which I think has helped they have Iodosorb which was given to them by supply I think that is perfectly fine essentially is good to do the same thing. Will try to get this wound cleaned out so that we can get it to heal. 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 Electronic Signature(s) Signed: 02/17/2023 1:03:21 PM By: [name] PA-C Entered By: [name] on 02/17/2023 10:03:20 -------------------------------------------------------------------------------- Physical Exam Details Patient Name: Date of Service: Dean Croak King. 02/17/2023 9:45 A M Medical Record Number: [id number] Patient Account Number: [id number] Date of Birth/Sex: Treating RN: 26-Sep-1945 (77 y.o. [name] Primary Care Provider: Dewaine Oats Other Clinician: Referring Provider: Treating Provider/Extender: Dennie Bible Weeks in Treatment:  1 Constitutional Well-nourished and well-hydrated in no acute distress. Respiratory normal breathing without difficulty. Psychiatric this patient is able to make decisions and demonstrates good insight into disease process. Alert and Oriented x 3. pleasant and cooperative. Notes Upon inspection patient's wound bed actually showed signs of good granulation epithelization at this point.  Fortunately I do not see any signs of active infection locally or systemically which is great news and in general I do believe that we are making really good headway here towards closure. Electronic Signature(s) Signed: 02/17/2023 1:03:46 PM By: [name] PA-C Entered By: [name] on 02/17/2023 10:03:45 -------------------------------------------------------------------------------- Physician Orders Details Patient Name: Date of Service: Dean Croak King. 02/17/2023 9:45 A M Medical Record Number: [id number] Patient Account Number: [id number] Date of Birth/Sex: Treating RN: November 06, 1945 (77 y.o. 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 Bathing/ Shower/ Hygiene May shower with wound dressing protected with water repellent cover or cast protector. No tub bath. Anesthetic (Use 'Patient Medications' Section for Anesthetic Order Entry) Lidocaine applied to wound bed Off-Loading Open toe surgical shoe - front off loader Hyperbaric Oxygen Therapy Wound #1 Right Amputation Site - Toe Other - discussed with patient at his new pt appointment Wound Treatment Wound #1 - Amputation Site - Toe Wound Laterality: Right Cleanser: Byram Ancillary Kit - 15 Day Supply (Generic) 3 x Per Week/15 Days Discharge Instructions: Use supplies as instructed; Kit contains: (15) Saline Bullets; (15) 3x3 Gauze; 15 pr Gloves Cleanser: Soap and Water 3 x Per Week/15 Days Discharge Instructions: Gently cleanse wound with antibacterial soap, rinse and pat dry prior to dressing wounds Cleanser: Wound Cleanser 3 x Per Week/15 Days Discharge Instructions: Wash your hands with soap and water. Remove old dressing, discard into plastic bag and place into trash. Cleanse the wound with Wound Cleanser prior to applying a clean dressing using gauze sponges, not tissues or cotton balls. Do not scrub or use excessive force. Pat dry using gauze sponges, not tissue or cotton balls. Prim Dressing: IODOFLEX 0.9% Cadexomer Iodine Pad (Generic) 3 x Per Week/15 Days ary Discharge Instructions: Apply Iodoflex to wound bed only as directed. Secondary Dressing: (BORDER) Zetuvit Plus SILICONE BORDER Dressing 4x4 (in/in) (Generic) 3 x Per Week/15 Days Discharge Instructions: Please do not put silicone bordered dressings under wraps. Use non-bordered dressing only. Secured With: [employer], Latex-free, Size 5, Small-Head / Shoulder / Thigh 3 x Per Week/15 Days Electronic Signature(s) Signed: 02/17/2023 3:42:23 PM By: [name] PA-C Signed: 02/17/2023  4:18:14 PM By: Angelina Pih Entered By: Angelina Pih on 02/17/2023 07:37:33 Dean King, Dean King ([id number]) [id number] Page 5 of 8 -------------------------------------------------------------------------------- Problem List Details Patient Name: Date of Service: Dean King, Dean King 02/17/2023 9:45 A M Medical Record Number: [id number] Patient Account Number: [id number] Date of Birth/Sex: Treating RN: [birth year] (77 y.o. 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, right ankle and foot 02/10/2023 No Yes  T81.31XA Disruption of external operation (surgical) wound, not elsewhere classified, 02/10/2023 No Yes initial encounter E11.621 Type 2 diabetes mellitus with foot ulcer 02/10/2023 No Yes L97.512 Non-pressure chronic ulcer of other part of right foot with fat layer exposed 02/10/2023 No Yes I25.10 Atherosclerotic heart disease of native coronary artery without angina pectoris 02/10/2023 No Yes I50.42 Chronic combined systolic (congestive) and diastolic (congestive) heart failure 02/10/2023 No Yes I48.0 Paroxysmal atrial fibrillation 02/10/2023 No Yes Z79.01 Long term (current) use of anticoagulants 02/10/2023 No Yes Inactive Problems Resolved Problems Electronic Signature(s) Signed: 02/17/2023 9:54:03 AM By: [name] PA-C Entered By: [name] on 02/17/2023 06:54:02 Vickii Penna King ([id number]) [id number] Page 6 of 8 -------------------------------------------------------------------------------- Progress Note Details Patient Name: Date of Service: Dean King, Dean King 02/17/2023 9:45 A M Medical Record Number: [id number] Patient Account Number: [id number] Date of Birth/Sex: Treating  RN: 24-Jul-1945 (77 y.o. [name] Primary Care Provider: Dewaine Oats Other Clinician: Referring Provider: Treating Provider/Extender: Dennie Bible Weeks in Treatment: 1 Subjective Chief Complaint Information obtained from Patient Right foot osteomyelitis with 5th ray amputation History of Present Illness (HPI) Epic Notes: The right common femoral is widely patent as is the profunda femoris. The SFA and popliteal have been stented in the past and the stents are widely patent. The trifurcation is diseased with occlusion of the anterior tibial artery, at the very distalmost aspect of the tibioperoneal trunk there is a greater than 90% stenosis which obstructs flow to the peroneal and posterior tibial arteries. Then approximately 3 to 5 cm distal to this lesion there is a greater than 70% stenosis in the posterior tibial. Lastly, as described above in the distalmost aspect of the posterior tibial and the plantar artery there is a diffuse greater than 80% stenosis. Following angioplasty the right posterior tibial now is now widely patent including the distal plantar branch and demonstrates in-line flow with less than 20% residual stenosis. It is much improved and looks quite nice. Angioplasty and stent placement of the right external iliac artery also yields an excellent result with less than 10% residual stenosis. Summary: Successful recanalization right lower extremity for limb salvage Admission to Wound Care Center: 02-10-2023 upon evaluation today patient appears to be doing somewhat poorly in regard to a wound that occurred as a result of initially a callus this was back in around March. Subsequently the patient tells me that he following this time in March ended up with an open wound he was seeing Dr. Allena Katz and slowly this open wound got worse to the point that he actually ended up having to go for surgery this was a ray amputation and that actually occurred on January 16, 2023. The patient is also been revascularized to have that note above which was reviewed in epic. It does appear that he had 90% stenosis of the peroneal and posterior tibial arteries after intervention this was a less than 10% residual stenosis he did have balloon angioplasty and stent. The patient's most recent hemoglobin A1c was 7.8 on 01-14-2023. 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 I did review labs from the patient's transplant doctor as he is a liver transplant patient did reveal that his CMP was  essentially okay with no major complications he did have chronic kidney disease noted with a GFR of 54 subsequently when looking at his white blood cell count it was actually elevated at 11.7 hemoglobin was somewhat low at 11.1 also did subsequently check out as well his MRI which showed that he had had an osteomyelitis of the fifth metatarsal head and into the proximal phalanx. He had septic arthritis as well. Subsequently this was an MRI that was prior to the amputation on 01-15-2023 amputation which was a ray amputation was undertaken the next day. 02-17-2023 upon evaluation today patient appears to be doing well currently in regard to his wound which all things considered is actually doing quite well. Fortunately I do not see any signs of active infection at this time systemically locally he did test positive for Pseudomonas unfortunately the only oral medication for this is Levaquin he is allergic to Levaquin. For that reason we are going to actually continue with the recommendation currently for the topical Iodoflex which I think has helped they have Iodosorb which was given  to them by supply I think that is perfectly fine essentially is good to do the same thing. Will try to get this wound cleaned out so that we can get it to heal. 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 Objective Constitutional Well-nourished and well-hydrated in no acute distress. Vitals Time Taken: 9:53 AM, Height: 70 in, Weight: 167 lbs, BMI: 24, Temperature: 97.4 F, Pulse: 78 bpm, Respiratory Rate: 18 breaths/min, Blood Pressure: 107/70 mmHg. Dean King, Dean King ([id number]) [id number] Page 7 of 8 Respiratory normal breathing without difficulty. Psychiatric this patient is able to make decisions and demonstrates good insight into disease process. Alert and Oriented x 3. pleasant and cooperative. General Notes: Upon inspection patient's wound bed actually showed signs of good granulation epithelization at this point. Fortunately I do not see any signs of active infection locally or systemically which is great news and in general I do believe that we are making really good headway here towards closure. Integumentary (Hair, Skin) Wound #1 status is Open. Original cause of wound was Surgical Injury. The date acquired was: 01/16/2023. The wound has been in treatment 1 weeks. The wound is located on the Right Amputation Site - T The wound measures 2.5cm length x 3.5cm width x 0.7cm depth; 6.872cm^2 area and 4.811cm^3 volume. oe. There is Fat Layer (Subcutaneous Tissue) exposed. There is no tunneling or undermining noted. There is a medium amount of serosanguineous drainage noted. There is small (1-33%) red, pink granulation within the wound bed. There is a large (67-100%) amount of necrotic tissue within the wound bed including Adherent Slough. Assessment Active Problems ICD-10 Chronic multifocal osteomyelitis,  right ankle and foot Disruption of external operation (surgical) wound, not elsewhere classified, initial encounter Type 2 diabetes mellitus with foot ulcer Non-pressure chronic ulcer of other part of right foot with fat layer exposed Atherosclerotic heart disease of native coronary artery without angina pectoris Chronic combined systolic (congestive) and diastolic (congestive) heart failure Paroxysmal atrial fibrillation Long term (current) use of anticoagulants Procedures Wound #1 Pre-procedure diagnosis of Wound #1 is a Diabetic Wound/Ulcer of the Lower Extremity located on the Right Amputation Site - T .Severity of Tissue Pre oe Debridement is: Fat layer exposed. There was a Excisional Skin/Subcutaneous Tissue Debridement with a total area of 6.87 sq  cm performed by [name], PA-C. With the following instrument(s): Curette to remove Viable and Non-Viable tissue/material. Material removed includes Subcutaneous Tissue, Slough, and Biofilm after achieving pain control using Lidocaine 4% Topical Solution. No specimens were taken. A time out was conducted at 10:37, prior to the start of the procedure. A Moderate amount of bleeding was controlled with Silver Nitrate. The procedure was tolerated well. Post Debridement Measurements: 2.5cm length x 3.5cm width x 0.7cm depth; 4.811cm^3 volume. Character of Wound/Ulcer Post Debridement is stable. Severity of Tissue Post Debridement is: Fat layer exposed. Post procedure Diagnosis Wound #1: Same as Pre-Procedure General Notes: 1 stick silver nitrate used. Plan Follow-up Appointments: Return Appointment in 1 week. Bathing/ Shower/ Hygiene: May shower with wound dressing protected with water repellent cover or cast protector. No tub bath. Anesthetic (Use 'Patient Medications' Section for Anesthetic Order Entry): Lidocaine applied to wound bed Off-Loading: Open toe surgical shoe - front off loader Hyperbaric Oxygen Therapy: Wound #1 Right  Amputation Site - T oe: Other - discussed with patient at his new pt appointment WOUND #1: - Amputation Site - T oe Wound Laterality: Right Cleanser: Byram Ancillary Kit - 15 Day Supply (Generic) 3 x Per Week/15 Days Discharge Instructions: Use supplies as instructed; Kit contains: (15) Saline Bullets; (15) 3x3 Gauze; 15 pr Gloves Cleanser: Soap and Water 3 x Per Week/15 Days Discharge Instructions: Gently cleanse wound with antibacterial soap, rinse and pat dry prior to dressing wounds Cleanser: Wound Cleanser 3 x Per Week/15 Days Discharge Instructions: Wash your hands with soap and water. Remove old dressing, discard into plastic bag and place into trash. Cleanse the wound with Wound Cleanser prior to applying a clean dressing using gauze sponges, not tissues or cotton balls. Do not scrub or use excessive force. Pat dry using gauze sponges, not tissue or cotton balls. Prim Dressing: IODOFLEX 0.9% Cadexomer Iodine Pad (Generic) 3 x Per Week/15 Days ary Discharge Instructions: Apply Iodoflex to wound bed only as directed. Secondary Dressing: (BORDER) Zetuvit Plus SILICONE BORDER Dressing 4x4 (in/in) (Generic) 3 x Per Week/15 Days Discharge Instructions: Please do not put silicone bordered dressings under wraps. Use non-bordered dressing only. Secured With: [employer], Latex-free, Size 5, Small-Head / Shoulder / Thigh 3 x Per Week/15 Days Dean King, Dean King ([id number]) [id number] Page 8 of 8 1. I would recommend based on what we are seeing that we go ahead and have the patient continue to monitor for any signs of infection or worsening. Based on what I see I do believe that the patient is doing well with the Iodoflex working to try to continue along those lines. 2. With regard to the culture which was positive for Pseudomonas and this was from a deep aspect of the wound not superficially after I cleaned out a lot of necrotic tissue last week I am concerned  that he may need some IV antibiotics through the fact that there really are not any good oral options that I can do with him being allergic to Levaquin. 3. I am also can recommend that we should continue with appropriate offloading he is using offloading shoe. We will see patient back for reevaluation in 1 week here in the clinic. If anything worsens or changes patient will contact our office for additional recommendations. Electronic Signature(s) Signed: 02/17/2023 1:04:32 PM By: [name] PA-C Entered By: [name] on 02/17/2023 10:04:32 -------------------------------------------------------------------------------- SuperBill Details Patient Name: Date of Service: Dean Croak King. 02/17/2023 Medical Record Number: [id number] Patient Account Number: [id number]  Date of Birth/Sex: Treating RN: 1945-05-26 (77 y.o. 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 without angina pectoris I50.42 Chronic combined systolic (congestive) and diastolic (congestive) heart failure I48.0 Paroxysmal atrial fibrillation Z79.01 Long term (current) use of anticoagulants Facility Procedures : CPT4 Code: 88416606 Description: 11042 - DEB SUBQ TISSUE 20 SQ CM/< ICD-10 Diagnosis Description L97.512 Non-pressure chronic ulcer of other part of right foot with fat layer exposed Modifier: Quantity: 1 Physician Procedures : CPT4 Code Description Modifier 11042 11042 - WC PHYS SUBQ  TISS 20 SQ CM ICD-10 Diagnosis Description L97.512 Non-pressure chronic ulcer of other part of right foot with fat layer exposed Quantity: 1 Electronic Signature(s) Signed: 02/17/2023 1:07:08 PM By: [name] PA-C Entered By: [name] on 02/17/2023 10:07:08

## 2023-02-17 NOTE — Progress Notes (Signed)
TREG, NANNY ([id number]) [id number] Page 1 of 9 Visit Report for 02/17/2023 Arrival Information Details Patient Name: Date of Service: Dean King, Dean King 02/17/2023 9:45 A M Medical Record Number: [id number] Patient Account Number: [id number] Date of Birth/Sex: Treating RN: 12/11/1945 (77 y.o. Dean King Primary Care Buford Bremer: Dewaine Oats Other Clinician: Referring Mandeep Ferch: Treating Carrson Lightcap/Extender: Dennie Bible Weeks in Treatment: 1 Visit Information History Since Last Visit Added or deleted any medications: No Patient Arrived: Ambulatory Any new allergies or adverse reactions: No Arrival Time: 09:51 Had a fall or experienced change in Yes Accompanied By: spouse activities of daily living that may affect Transfer Assistance: None risk of falls: Patient Identification Verified: Yes Hospitalized since last visit: No Secondary Verification Process Completed: Yes Has Dressing in Place as Prescribed: Yes Patient Has Alerts: Yes Has Footwear/Offloading in Place as Yes Patient Alerts: Patient on Blood Thinner Prescribed: type 2 diabetic Left: Surgical Shoe with Pressure Relief Insole Pain Present Now: Yes Electronic Signature(s) Signed: 02/17/2023 4:18:14 PM By: Angelina Pih Entered By: Angelina Pih on 02/17/2023 06:52:53 -------------------------------------------------------------------------------- Clinic Level of Care Assessment Details Patient Name: Date of Service: Dean King, Dean King 02/17/2023 9:45 A M Medical Record Number: [id number] Patient Account Number: [id number] Date of Birth/Sex: Treating RN: 07-30-1945 (77 y.o. Dean King Primary Care Rainie Crenshaw: Dewaine Oats Other Clinician: Referring Velna Hedgecock: Treating Clancy Leiner/Extender: Dennie Bible Weeks in Treatment: 1 Clinic Level of Care Assessment Items TOOL 1 Quantity Score []  - 0 Use when EandM and Procedure is performed on INITIAL  visit ASSESSMENTS - Nursing Assessment / Reassessment []  - 0 General Physical Exam (combine w/ comprehensive assessment (listed just below) when performed on new pt. evals) []  - 0 Comprehensive Assessment (HX, ROS, Risk Assessments, Wounds Hx, etc.) ASSESSMENTS - Wound and Skin Assessment / Reassessment Dean King, Dean King ([id number]) [id number] Page 2 of 9 []  - 0 Dermatologic / Skin Assessment (not related to wound area) ASSESSMENTS - Ostomy and/or Continence Assessment and Care []  - 0 Incontinence Assessment and Management []  - 0 Ostomy Care Assessment and Management (repouching, etc.) PROCESS - Coordination of Care []  - 0 Simple Patient / Family Education for ongoing care []  - 0 Complex (extensive) Patient / Family Education for ongoing care []  - 0 Staff obtains Chiropractor, Records, T Results / Process Orders est []  - 0 Staff telephones HHA, Nursing Homes / Clarify orders / etc []  - 0 Routine Transfer to another Facility (non-emergent condition) []  - 0 Routine Hospital Admission (non-emergent condition) []  - 0 New Admissions / Manufacturing engineer / Ordering NPWT Apligraf, etc. , []  - 0 Emergency Hospital Admission (emergent condition) PROCESS - Special Needs []  - 0 Pediatric / Minor Patient Management []  - 0 Isolation Patient Management []  - 0 Hearing / Language / Visual special needs []  - 0 Assessment of Community assistance (transportation, King/C planning, etc.) []  - 0 Additional assistance / Altered mentation []  - 0 Support Surface(s) Assessment (bed, cushion, seat, etc.) INTERVENTIONS - Miscellaneous []  - 0 External ear exam []  - 0 Patient Transfer (multiple staff / Nurse, adult / Similar devices) []  - 0 Simple Staple / Suture removal (25 or less) []  - 0 Complex Staple / Suture removal (26 or more) []  - 0 Hypo/Hyperglycemic Management (do not check if billed separately) []  - 0 Ankle / Brachial Index (ABI) - do not check if billed  separately Has the patient been seen at the hospital within the last three years: Yes Total Score: 0 Level Of Care: ____  Electronic Signature(s) Signed: 02/17/2023 4:18:14 PM By: Angelina Pih Entered By: Angelina Pih on 02/17/2023 07:39:15 -------------------------------------------------------------------------------- Encounter Discharge Information Details Patient Name: Date of Service: Dean Croak King. 02/17/2023 9:45 A M Medical Record Number: [id number] Patient Account Number: [id number] Date of Birth/Sex: Treating RN: [birth year] (77 y.o. Dean King Primary Care Alexcis Bicking: Dewaine Oats Other Clinician: Referring Kiet Geer: Treating Svea Pusch/Extender: Dennie Bible Weeks in Treatment: 1 [place], Lexa King ([id number]) [id number] Page 3 of 9 Encounter Discharge Information Items Post Procedure Vitals Discharge Condition: Stable Temperature (F): 97.4 Ambulatory Status: Ambulatory Pulse (bpm): 78 Discharge Destination: Home Respiratory Rate (breaths/min): 18 Transportation: Private Auto Blood Pressure (mmHg): 107/70 Accompanied By: spouse Schedule Follow-up Appointment: Yes Clinical Summary of Care: Electronic Signature(s) Signed: 02/17/2023 4:18:14 PM By: Angelina Pih Entered By: Angelina Pih on 02/17/2023 07:40:17 -------------------------------------------------------------------------------- Lower Extremity Assessment Details Patient Name: Date of Service: Dean King, Dean King. 02/17/2023 9:45 A M Medical Record Number: [id number] Patient Account Number: [id number] Date of Birth/Sex: Treating RN: Mar 22, 1946 (77 y.o. Dean King Primary Care Eryanna Regal: Dewaine Oats Other Clinician: Referring Ashlley Booher: Treating Herrick Hartog/Extender: Dennie Bible Weeks in Treatment: 1 Edema Assessment Assessed: [Left: No] [Right: No] Edema: [Left: Ye] [Right: s] Calf Left: Right: Point of Measurement: 35 cm From Medial Instep  37.5 cm Ankle Left: Right: Point of Measurement: 10 cm From Medial Instep 23.6 cm Vascular Assessment Pulses: Dorsalis Pedis Palpable: [Right:Yes] Extremity colors, hair growth, and conditions: Extremity Color: [Right:Normal] Hair Growth on Extremity: [Right:Yes] Temperature of Extremity: [Right:Warm < 3 seconds] Toe Nail Assessment Left: Right: Thick: No Discolored: No Deformed: No Improper Length and Hygiene: No Notes cap refill sluggish Electronic Signature(s) Signed: 02/17/2023 4:18:14 PM By: Delfin Gant, Jillyn Hidden King ([id number]) [id number] Page 4 of 9 Signed: 02/17/2023 4:18:14 PM By: Angelina Pih Entered By: Angelina Pih on 02/17/2023 07:02:41 -------------------------------------------------------------------------------- Multi Wound Chart Details Patient Name: Date of Service: Dean Croak King. 02/17/2023 9:45 A M Medical Record Number: [id number] Patient Account Number: [id number] Date of Birth/Sex: Treating RN: 01-08-1946 (77 y.o. Dean King Primary Care Adraine Biffle: Dewaine Oats Other Clinician: Referring Tiyanna Larcom: Treating Hakop Humbarger/Extender: Dennie Bible Weeks in Treatment: 1 Vital Signs Height(in): 70 Pulse(bpm): 78 Weight(lbs): 167 Blood Pressure(mmHg): 107/70 Body Mass Index(BMI): 24 Temperature(F): 97.4 Respiratory Rate(breaths/min): 18 [1:Photos:] [N/A:N/A] Right Amputation Site - Toe N/A N/A Wound Location: Surgical Injury N/A N/A Wounding Event: Diabetic Wound/Ulcer of the Lower N/A N/A Primary Etiology: Extremity Arrhythmia, Congestive Heart Failure, N/A N/A Comorbid History: Coronary Artery Disease, Hypertension, Type II Diabetes, Osteomyelitis 01/16/2023 N/A N/A Date Acquired: 1 N/A N/A Weeks of Treatment: Open N/A N/A Wound Status: No N/A N/A Wound Recurrence: Yes N/A N/A Pending A mputation on Presentation: 2.5x3.5x0.7 N/A N/A Measurements L x W x King (cm) 6.872 N/A N/A A  (cm) : rea 4.811 N/A N/A Volume (cm) : -25.00% N/A N/A % Reduction in A rea: 12.50% N/A N/A % Reduction in Volume: Grade 3 N/A N/A Classification: Medium N/A N/A Exudate A mount: Serosanguineous N/A N/A Exudate Type: red, brown N/A N/A Exudate Color: Small (1-33%) N/A N/A Granulation A mount: Red, Pink N/A N/A Granulation Quality: Large (67-100%) N/A N/A Necrotic A mount: Fat Layer (Subcutaneous Tissue): Yes N/A N/A Exposed Structures: None N/A N/A Epithelialization: Treatment Notes Electronic Signature(s) Signed: 02/17/2023 4:18:14 PM By: Delfin Gant, Jillyn Hidden King ([id number]) [id number] Page 5 of 9 Entered By: Angelina Pih on 02/17/2023 07:34:55 -------------------------------------------------------------------------------- Multi-Disciplinary Care Plan Details Patient Name: Date of Service: Dean King, Dean King. 02/17/2023  9:45 A M Medical Record Number: [id number] Patient Account Number: [id number] Date of Birth/Sex: Treating RN: 1945/05/30 (77 y.o. Dean King Primary Care Kaiulani Sitton: Dewaine Oats Other Clinician: Referring Ceana Fiala: Treating Daphna Lafuente/Extender: Dennie Bible Weeks in Treatment: 1 Active Inactive Necrotic Tissue Nursing Diagnoses: Impaired tissue integrity related to necrotic/devitalized tissue Knowledge deficit related to management of necrotic/devitalized tissue Goals: Necrotic/devitalized tissue will be minimized in the wound bed Date Initiated: 02/10/2023 Target Resolution Date: 04/07/2023 Goal Status: Active Patient/caregiver will verbalize understanding of reason and process for debridement of necrotic tissue Date Initiated: 02/10/2023 Date Inactivated: 02/10/2023 Target Resolution Date: 02/10/2023 Goal Status: Met Interventions: Assess patient pain level pre-, during and post procedure and prior to discharge Provide education on necrotic tissue and debridement process Treatment  Activities: Apply topical anesthetic as ordered : 02/10/2023 Excisional debridement : 02/10/2023 Notes: Wound/Skin Impairment Nursing Diagnoses: Impaired tissue integrity Knowledge deficit related to ulceration/compromised skin integrity Goals: Ulcer/skin breakdown will have a volume reduction of 30% by week 4 Date Initiated: 02/10/2023 Target Resolution Date: 03/10/2023 Goal Status: Active Ulcer/skin breakdown will have a volume reduction of 50% by week 8 Date Initiated: 02/10/2023 Target Resolution Date: 04/07/2023 Goal Status: Active Ulcer/skin breakdown will have a volume reduction of 80% by week 12 Date Initiated: 02/10/2023 Target Resolution Date: 05/05/2023 Goal Status: Active Ulcer/skin breakdown will heal within 14 weeks Date Initiated: 02/10/2023 Target Resolution Date: 05/19/2023 Goal Status: Active Interventions: Assess patient/caregiver ability to obtain necessary supplies Assess patient/caregiver ability to perform ulcer/skin care regimen upon admission and as needed Assess ulceration(s) every visit Dean King, Dean King ([id number]) [id number] Page 6 of 9 Provide education on ulcer and skin care Notes: HBO discussed with patient and spouse Electronic Signature(s) Signed: 02/17/2023 4:18:14 PM By: Angelina Pih Entered By: Angelina Pih on 02/17/2023 07:39:29 -------------------------------------------------------------------------------- Pain Assessment Details Patient Name: Date of Service: Dean Croak King. 02/17/2023 9:45 A M Medical Record Number: [id number] Patient Account Number: [id number] Date of Birth/Sex: Treating RN: 1946/01/18 (77 y.o. Dean King Primary Care Prisilla Kocsis: Dewaine Oats Other Clinician: Referring Earnstine Meinders: Treating Diany Formosa/Extender: Dennie Bible Weeks in Treatment: 1 Active Problems Location of Pain Severity and Description of Pain Patient Has Paino Yes Site Locations Rate the pain. Current  Pain Level: 5 Pain Management and Medication Current Pain Management: Electronic Signature(s) Signed: 02/17/2023 4:18:14 PM By: Angelina Pih Entered By: Angelina Pih on 02/17/2023 06:53:37 Dean King ([id number]) [id number] Page 7 of 9 -------------------------------------------------------------------------------- Patient/Caregiver Education Details Patient Name: Date of Service: Dean King, Dean King 11/25/2024andnbsp9:45 A M Medical Record Number: [id number] Patient Account Number: [id number] Date of Birth/Gender: Treating RN: [birth year] (77 y.o. Dean King Primary Care Physician: Dewaine Oats Other Clinician: Referring Physician: Treating Physician/Extender: Dennie Bible Weeks in Treatment: 1 Education Assessment Education Provided To: Patient Education Topics Provided Wound/Skin Impairment: Handouts: Caring for Your Ulcer Methods: Explain/Verbal Responses: State content correctly Electronic Signature(s) Signed: 02/17/2023 4:18:14 PM By: Angelina Pih Entered By: Angelina Pih on 02/17/2023 07:39:38 -------------------------------------------------------------------------------- Wound Assessment Details Patient Name: Date of Service: Dean Croak King. 02/17/2023 9:45 A M Medical Record Number: [id number] Patient Account Number: [id number] Date of Birth/Sex: Treating RN: [birth year] (77 y.o. Dean King Primary Care Emunah Texidor: Dewaine Oats Other Clinician: Referring Kimiya Brunelle: Treating Evelise Reine/Extender: Dennie Bible Weeks in Treatment: 1 Wound Status Wound Number: 1 Primary Diabetic Wound/Ulcer of the Lower Extremity Etiology: Wound Location: Right Amputation Site - Toe Wound Open Wounding Event: Surgical Injury Status: Date Acquired: 01/16/2023 Comorbid Arrhythmia, Congestive Heart Failure,  Coronary Artery Disease, Weeks Of Treatment: 1 History: Hypertension, Type II Diabetes,  Osteomyelitis Clustered Wound: No Pending Amputation On Presentation Photos Dean King, Dean King ([id number]) [id number] Page 8 of 9 Wound Measurements Length: (cm) 2.5 Width: (cm) 3.5 Depth: (cm) 0.7 Area: (cm) 6.872 Volume: (cm) 4.811 % Reduction in Area: -25% % Reduction in Volume: 12.5% Epithelialization: None Tunneling: No Undermining: No Wound Description Classification: Grade 3 Exudate Amount: Medium Exudate Type: Serosanguineous Exudate Color: red, brown Foul Odor After Cleansing: No Slough/Fibrino Yes Wound Bed Granulation Amount: Small (1-33%) Exposed Structure Granulation Quality: Red, Pink Fat Layer (Subcutaneous Tissue) Exposed: Yes Necrotic Amount: Large (67-100%) Necrotic Quality: Adherent Slough Treatment Notes Wound #1 (Amputation Site - Toe) Wound Laterality: Right Cleanser [place] Ancillary Kit - 15 Day Supply Discharge Instruction: Use supplies as instructed; Kit contains: (15) Saline Bullets; (15) 3x3 Gauze; 15 pr Gloves Soap and Water Discharge Instruction: Gently cleanse wound with antibacterial soap, rinse and pat dry prior to dressing wounds Wound Cleanser Discharge Instruction: Wash your hands with soap and water. Remove old dressing, discard into plastic bag and place into trash. Cleanse the wound with Wound Cleanser prior to applying a clean dressing using gauze sponges, not tissues or cotton balls. Do not scrub or use excessive force. Pat dry using gauze sponges, not tissue or cotton balls. Peri-Wound Care Topical Primary Dressing IODOFLEX 0.9% Cadexomer Iodine Pad Discharge Instruction: Apply Iodoflex to wound bed only as directed. Secondary Dressing (BORDER) Zetuvit Plus SILICONE BORDER Dressing 4x4 (in/in) Discharge Instruction: Please do not put silicone bordered dressings under wraps. Use non-bordered dressing only. Secured With [employer], Latex-free, Size 5, Small-Head / Shoulder / Thigh Compression  Wrap Compression Stockings Add-Ons Electronic Signature(s) Signed: 02/17/2023 4:18:14 PM By: Angelina Pih Entered By: Angelina Pih on 02/17/2023 07:01:34 Dean King ([id number]) [id number] Page 9 of 9 -------------------------------------------------------------------------------- Vitals Details Patient Name: Date of Service: Dean King, Dean King 02/17/2023 9:45 A M Medical Record Number: [id number] Patient Account Number: [id number] Date of Birth/Sex: Treating RN: 09-14-1945 (77 y.o. Dean King Primary Care Travanti Mcmanus: Dewaine Oats Other Clinician: Referring Damian Hofstra: Treating Marijayne Rauth/Extender: Dennie Bible Weeks in Treatment: 1 Vital Signs Time Taken: 09:53 Temperature (F): 97.4 Height (in): 70 Pulse (bpm): 78 Weight (lbs): 167 Respiratory Rate (breaths/min): 18 Body Mass Index (BMI): 24 Blood Pressure (mmHg): 107/70 Reference Range: 80 - 120 mg / dl Electronic Signature(s) Signed: 02/17/2023 4:18:14 PM By: Angelina Pih Entered By: Angelina Pih on 02/17/2023 06:53:20

## 2023-02-20 ENCOUNTER — Encounter: Payer: Self-pay | Admitting: Podiatry

## 2023-02-24 ENCOUNTER — Ambulatory Visit: Payer: Medicare Other | Admitting: Physician Assistant

## 2023-02-24 ENCOUNTER — Other Ambulatory Visit: Payer: Self-pay | Admitting: Podiatry

## 2023-02-24 MED ORDER — GABAPENTIN 100 MG PO CAPS: 100.0000 mg | ORAL_CAPSULE | Freq: Three times a day (TID) | ORAL | 3 refills | Status: AC

## 2023-02-25 ENCOUNTER — Ambulatory Visit: Payer: Medicare Other | Attending: Infectious Diseases | Admitting: Infectious Diseases

## 2023-02-25 ENCOUNTER — Other Ambulatory Visit
Admission: RE | Admit: 2023-02-25 | Discharge: 2023-02-25 | Disposition: A | Discharge status: Home / Self Care | Payer: Medicare Other | Source: Ambulatory Visit | Attending: Infectious Diseases | Admitting: Infectious Diseases

## 2023-02-25 ENCOUNTER — Encounter: Payer: Medicare Other | Attending: Physician Assistant | Admitting: Physician Assistant

## 2023-02-25 DIAGNOSIS — Z89431 Acquired absence of right foot: Secondary | ICD-10-CM | POA: Diagnosis not present

## 2023-02-25 DIAGNOSIS — L089 Local infection of the skin and subcutaneous tissue, unspecified: Secondary | ICD-10-CM | POA: Diagnosis not present

## 2023-02-25 DIAGNOSIS — M868X7 Other osteomyelitis, ankle and foot: Secondary | ICD-10-CM | POA: Diagnosis not present

## 2023-02-25 DIAGNOSIS — L97512 Non-pressure chronic ulcer of other part of right foot with fat layer exposed: Secondary | ICD-10-CM | POA: Diagnosis not present

## 2023-02-25 DIAGNOSIS — I1310 Hypertensive heart and chronic kidney disease without heart failure, with stage 1 through stage 4 chronic kidney disease, or unspecified chronic kidney disease: Secondary | ICD-10-CM | POA: Diagnosis not present

## 2023-02-25 DIAGNOSIS — B965 Pseudomonas (aeruginosa) (mallei) (pseudomallei) as the cause of diseases classified elsewhere: Secondary | ICD-10-CM | POA: Diagnosis not present

## 2023-02-25 DIAGNOSIS — Z89421 Acquired absence of other right toe(s): Secondary | ICD-10-CM

## 2023-02-25 DIAGNOSIS — Z7984 Long term (current) use of oral hypoglycemic drugs: Secondary | ICD-10-CM

## 2023-02-25 DIAGNOSIS — I480 Paroxysmal atrial fibrillation: Secondary | ICD-10-CM | POA: Diagnosis not present

## 2023-02-25 DIAGNOSIS — E1122 Type 2 diabetes mellitus with diabetic chronic kidney disease: Secondary | ICD-10-CM | POA: Insufficient documentation

## 2023-02-25 DIAGNOSIS — I2510 Atherosclerotic heart disease of native coronary artery without angina pectoris: Secondary | ICD-10-CM | POA: Diagnosis not present

## 2023-02-25 DIAGNOSIS — Z7901 Long term (current) use of anticoagulants: Secondary | ICD-10-CM | POA: Insufficient documentation

## 2023-02-25 DIAGNOSIS — E11628 Type 2 diabetes mellitus with other skin complications: Secondary | ICD-10-CM

## 2023-02-25 DIAGNOSIS — Z79899 Other long term (current) drug therapy: Secondary | ICD-10-CM | POA: Insufficient documentation

## 2023-02-25 DIAGNOSIS — N189 Chronic kidney disease, unspecified: Secondary | ICD-10-CM | POA: Insufficient documentation

## 2023-02-25 DIAGNOSIS — M86371 Chronic multifocal osteomyelitis, right ankle and foot: Secondary | ICD-10-CM | POA: Insufficient documentation

## 2023-02-25 DIAGNOSIS — I129 Hypertensive chronic kidney disease with stage 1 through stage 4 chronic kidney disease, or unspecified chronic kidney disease: Secondary | ICD-10-CM | POA: Diagnosis not present

## 2023-02-25 DIAGNOSIS — Z7902 Long term (current) use of antithrombotics/antiplatelets: Secondary | ICD-10-CM | POA: Diagnosis not present

## 2023-02-25 DIAGNOSIS — M86671 Other chronic osteomyelitis, right ankle and foot: Secondary | ICD-10-CM

## 2023-02-25 DIAGNOSIS — Z95810 Presence of automatic (implantable) cardiac defibrillator: Secondary | ICD-10-CM | POA: Insufficient documentation

## 2023-02-25 DIAGNOSIS — E1169 Type 2 diabetes mellitus with other specified complication: Secondary | ICD-10-CM | POA: Insufficient documentation

## 2023-02-25 DIAGNOSIS — Z955 Presence of coronary angioplasty implant and graft: Secondary | ICD-10-CM | POA: Diagnosis not present

## 2023-02-25 DIAGNOSIS — T8142XA Infection following a procedure, deep incisional surgical site, initial encounter: Secondary | ICD-10-CM | POA: Diagnosis present

## 2023-02-25 DIAGNOSIS — E1151 Type 2 diabetes mellitus with diabetic peripheral angiopathy without gangrene: Secondary | ICD-10-CM | POA: Diagnosis not present

## 2023-02-25 DIAGNOSIS — I5042 Chronic combined systolic (congestive) and diastolic (congestive) heart failure: Secondary | ICD-10-CM | POA: Insufficient documentation

## 2023-02-25 DIAGNOSIS — E11621 Type 2 diabetes mellitus with foot ulcer: Secondary | ICD-10-CM | POA: Insufficient documentation

## 2023-02-25 DIAGNOSIS — Z944 Liver transplant status: Secondary | ICD-10-CM | POA: Diagnosis not present

## 2023-02-25 NOTE — Progress Notes (Addendum)
NAME: Dean King  DOB: September 09, 1945  MRN: [id number]  Date/Time: 02/25/2023 1:14 PM   Subjective:  Pt referred to me by wound clinic ? Dean King is a 77 y.o. 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 Culture later came positive for pseudomonas He was discharged before that on doxycycline The wound has not healed and discharging pus- He was sent to wound clinic by podiatrist. As he is allergic to quinolone he is referred to me I saw him in the wound clinic today with his wife Pt has no fever or chills He has not been treated appropriately for the wound yet  Past Medical History:  Diagnosis Date   AICD (automatic cardioverter/defibrillator) present    Diabetes mellitus without complication (HCC)    Hypertension    Myocardial infarction [facility])    x2 2005, 2006    Past Surgical History:  Procedure Laterality Date   ABLATION  2023   AMPUTATION Right 01/16/2023   Procedure: PARTIAL 5TH RAY;  Surgeon: Candelaria Stagers, DPM;  Location: [facility] ORS;  Service: Orthopedics/Podiatry;  Laterality: Right;   CARDIAC CATHETERIZATION     CAROTID PTA/STENT INTERVENTION Left 04/05/2021   Procedure: CAROTID PTA/STENT INTERVENTION;  Surgeon: Annice Needy, MD;  Location: [facility] INVASIVE CV LAB;  Service: Cardiovascular;  Laterality: Left;   CHOLECYSTECTOMY     CORONARY ANGIOPLASTY     liver transplannt     LOWER EXTREMITY ANGIOGRAPHY Right 06/20/2022   Procedure: Lower Extremity Angiography;  Surgeon: Annice Needy, MD;  Location: [facility] INVASIVE CV LAB;  Service: Cardiovascular;  Laterality: Right;   LOWER EXTREMITY ANGIOGRAPHY Right 07/25/2022   Procedure: Lower Extremity Angiography;  Surgeon: Annice Needy, MD;  Location: [facility] INVASIVE CV LAB;  Service: Cardiovascular;  Laterality: Right;   LOWER EXTREMITY  ANGIOGRAPHY Right 01/17/2023   Procedure: Lower Extremity Angiography;  Surgeon: Renford Dills, MD;  Location: [facility] INVASIVE CV LAB;  Service: Cardiovascular;  Laterality: Right;   LOWER EXTREMITY INTERVENTION Right 07/26/2022   Procedure: LOWER EXTREMITY INTERVENTION;  Surgeon: Annice Needy, MD;  Location: [facility] INVASIVE CV LAB;  Service: Cardiovascular;  Laterality: Right;    Social History   Socioeconomic History   Marital status: Married    Spouse name: Not on file   Number of children: Not on file   Years of education: Not on file   Highest education level: Not on file  Occupational History   Not on file  Tobacco Use   Smoking status: Some Days    Types: Cigars   Smokeless tobacco: Never   Tobacco comments:    Occasional cigar when I play golf. 01/02/23 DJM  Vaping Use   Vaping status: Never Used  Substance and Sexual Activity   Alcohol use: Not Currently   Drug use: Never   Sexual activity: Not on file  Other Topics Concern   Not on file  Social History Narrative   Not on file   Social Determinants of Health   Financial Resource Strain: Low Risk  (07/05/2022)   [facility], [facility]   Overall Financial Resource Strain (CARDIA)    Difficulty of Paying Living Expenses: Not hard at all  Food Insecurity: No Food Insecurity (01/14/2023)   Hunger Vital Sign  Worried About Programme researcher, broadcasting/film/video in the Last Year: Never true    Ran Out of Food in the Last Year: Never true  Transportation Needs: No Transportation Needs (01/14/2023)   PRAPARE - Administrator, Civil Service (Medical): No    Lack of Transportation (Non-Medical): No  Physical Activity: Not on file  Stress: Not on file  Social Connections: Not on file  Intimate Partner Violence: Not At Risk (01/14/2023)   Humiliation, Afraid, Rape, and Kick questionnaire    Fear of Current or Ex-Partner: No    Emotionally Abused: No    Physically Abused: No    Sexually Abused: No     Family History  Family history unknown: Yes   Allergies  Allergen Reactions   Levaquin [Levofloxacin] Anaphylaxis   I? Current Outpatient Medications  Medication Sig Dispense Refill   atorvastatin (LIPITOR) 40 MG tablet Take 1 tablet (40 mg total) by mouth daily. 30 tablet 2   Calcium Carbonate-Vit D-Min (CALCIUM 600+D3 PLUS MINERALS) 600-800 MG-UNIT TABS Take 1 tablet by mouth 3 (three) times daily.     clopidogrel (PLAVIX) 75 MG tablet TAKE 1 TABLET BY MOUTH EVERY DAY 90 tablet 1   dapagliflozin propanediol (FARXIGA) 10 MG TABS tablet Take by mouth daily.     ELIQUIS 5 MG TABS tablet Take 1 tablet (5 mg total) by mouth 2 (two) times daily. 60 tablet 5   ENTRESTO 24-26 MG Take 0.5 tablets by mouth 2 (two) times daily.     furosemide (LASIX) 40 MG tablet Take 80 mg by mouth daily.     gabapentin (NEURONTIN) 100 MG capsule Take 1 capsule (100 mg total) by mouth 3 (three) times daily. 90 capsule 0   gabapentin (NEURONTIN) 100 MG capsule Take 1 capsule (100 mg total) by mouth 3 (three) times daily. 90 capsule 3   glipiZIDE (GLUCOTROL) 5 MG tablet Take 5 mg by mouth 2 (two) times daily before a meal.     magnesium oxide (MAG-OX) 400 MG tablet Take 400 mg by mouth 2 (two) times daily.     metFORMIN (GLUCOPHAGE) 500 MG tablet Take 1,000 mg by mouth 2 (two) times daily.     metoprolol succinate (TOPROL-XL) 100 MG 24 hr tablet Take 150 mg by mouth daily. Take 1 tablet (100mg ) by mouth daily and 0.5 tablet (50mg ) nightly     nitroGLYCERIN (NITROSTAT) 0.4 MG SL tablet Place 0.4 mg under the tongue as needed.     PROGRAF 0.5 MG capsule Take 0.5 mg by mouth 2 (two) times daily.     spironolactone (ALDACTONE) 25 MG tablet Take 12.5 mg by mouth daily.     No current facility-administered medications for this visit.     Abtx:  Anti-infectives (From admission, onward)    None       REVIEW OF SYSTEMS:  Const: negative fever, negative chills, negative weight loss Eyes: negative diplopia or  visual changes, negative eye pain ENT: negative coryza, negative sore throat Resp: negative cough, hemoptysis, dyspnea Cards: negative for chest pain, palpitations, lower extremity edema GU: negative for frequency, dysuria and hematuria GI: Negative for abdominal pain, diarrhea, bleeding, constipation Skin: negative for rash and pruritus Heme: negative for easy bruising and gum/nose bleeding MS: negative for myalgias, arthralgias, back pain and muscle weakness Neurolo:negative for headaches, dizziness, vertigo, memory problems  Psych: negative for feelings of anxiety, depression  Endocrine: diabetes Allergy/Immunology-quinolone- levaquin Objective:  VITALS:  There were no vitals taken for this visit.  PHYSICAL EXAM:  General: Alert, cooperative, no distress, appears stated age.  Head: Normocephalic, without obvious abnormality, atraumatic. Eyes: Conjunctivae clear, anicteric sclerae. Pupils are equal ENT Nares normal. No drainage or sinus tenderness. Lips, mucosa, and tongue normal. No Thrush Neck: Supple, symmetrical, no adenopathy, thyroid: non tender no carotid bruit and no JVD. Back: No CVA tenderness. Lungs: Clear to auscultation bilaterally. No Wheezing or Rhonchi. No rales. Heart: Regular rate and rhythm, no murmur, rub or gallop. Abdomen: did not examine Extremities: rt 5th toe amputation- site has purulent dischare and wound  Skin: No rashes or lesions. Or bruising Lymph: Cervical, supraclavicular normal. Neurologic: Grossly non-focal Pertinent Labs Lab Results CBC    Component Value Date/Time   WBC 8.7 01/18/2023 0503   RBC 3.59 (L) 01/18/2023 0503   HGB 9.7 (L) 01/18/2023 0503   HCT 28.5 (L) 01/18/2023 0503   PLT 242 01/18/2023 0503   MCV 79.4 (L) 01/18/2023 0503   MCH 27.0 01/18/2023 0503   MCHC 34.0 01/18/2023 0503   RDW 20.9 (H) 01/18/2023 0503   LYMPHSABS 2.3 03/23/2021 1819   MONOABS 0.7 03/23/2021 1819   EOSABS 0.2 03/23/2021 1819   BASOSABS 0.1  03/23/2021 1819       Latest Ref Rng & Units 01/18/2023    5:03 AM 01/17/2023    9:53 AM 01/16/2023    8:19 AM  CMP  Creatinine 0.61 - 1.24 mg/dL 2.72  5.36  6.44       Microbiology: No results found for this or any previous visit (from the past 240 hour(s)).  IMAGING RESULTS: No recent imaging 01/16/23- amputated 5th toe I have personally reviewed the films ? 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  Would discuss with Dr.Patel to repeat Xray  Dm on multiple meds including glipizide, farxiga, metformin CKD diuretics aggravating it  AICD Afib on eliquis Htn on entresto  Liver transplant on low dose prograf  ?Discussed the management with patient , his wife and wound care consultant Will contact patient once culture results and arrange for antibiotics by thursday ?Will follow up in my clinic  OPAT orders  Diagnosis: Pseudomonas foor infection' Diabetic foot infection  Baseline Creatinine 1.35     Allergies  Allergen Reactions   Levaquin [Levofloxacin] Anaphylaxis    OPAT Orders Discharge antibiotics: Cefepime 2 grams IV every 12 hours  Duration: 2 weeks - ( likely to extend to 4 weeks) End Date: 03/13/23 ( may extend 03/27/23)  PIC Care Per Protocol: Home Health RN to be consulted for  Change dressing every week Placing  biopatch Drawing labs  Labs weekly while on IV antibiotics: _X_ CBC with differential  _X_ CMP __X CRP _X_ ESR    _X_ Please leave PIC in place until doctor has seen patient or been notified  Fax weekly lab results  promptly to [phone]  Clinic Follow Up Appt: 03/11/23   Call [phone] with any questions

## 2023-02-26 ENCOUNTER — Telehealth: Payer: Self-pay

## 2023-02-26 NOTE — Telephone Encounter (Signed)
  Per Dr. Rivka Safer Pt is coming tomorrow for PICC around 1pm at [facility] same day surgery.   He will get his first dose of cefepime 2 grams tomorrow  He will need cefepime 2 grams IV q 12 for 2-4 weeks until 12/19- may extend to 03/28/23 with weekly CBC/CMP/ESR/CRP.  Orders have been sent to [facility] with [employer] as well. Dean King, CMA

## 2023-02-26 NOTE — Progress Notes (Addendum)
Dean King ([id number]) [id number] Page 1 of 9 Visit Report for 02/25/2023 Arrival Information Details Patient Name: Date of Service: Dean King, Dean King 02/25/2023 9:45 A M Medical Record Number: [id number] Patient Account Number: [id number] Date of Birth/Sex: Treating RN: 05-02-1945 (77 y.o. Dean King Primary Care Khriz Liddy: Dewaine Oats Other Clinician: Referring Sheral Pfahler: Treating Havish Petties/Extender: Dennie Bible Weeks in Treatment: 2 Visit Information History Since Last Visit Added or deleted any medications: No Patient Arrived: Ambulatory Any new allergies or adverse reactions: No Arrival Time: 09:50 Had a fall or experienced change in No Accompanied By: family activities of daily living that may affect Transfer Assistance: None risk of falls: Patient Identification Verified: Yes Hospitalized since last visit: No Secondary Verification Process Completed: Yes Has Dressing in Place as Prescribed: Yes Patient Has Alerts: Yes Pain Present Now: Yes Patient Alerts: Patient on Blood Thinner type 2 diabetic Electronic Signature(s) Signed: 02/26/2023 7:32:32 AM By: Angelina Pih Entered By: Angelina Pih on 02/26/2023 07:32:31 -------------------------------------------------------------------------------- Clinic Level of Care Assessment Details Patient Name: Date of Service: Dean King, Dean King 02/25/2023 9:45 A M Medical Record Number: [id number] Patient Account Number: [id number] Date of Birth/Sex: Treating RN: 1945-05-17 (77 y.o. Dean King Primary Care Kalani Sthilaire: Dewaine Oats Other Clinician: Referring Kamoni Gentles: Treating Aretha Levi/Extender: Dennie Bible Weeks in Treatment: 2 Clinic Level of Care Assessment Items TOOL 4 Quantity Score []  - 0 Use when only an EandM is performed on FOLLOW-UP visit ASSESSMENTS - Nursing Assessment / Reassessment X- 1 10 Reassessment of Co-morbidities (includes updates in patient  status) X- 1 5 Reassessment of Adherence to Treatment Plan ASSESSMENTS - Wound and Skin A ssessment / Reassessment X - Simple Wound Assessment / Reassessment - one wound 1 5 Dean King, Dean King ([id number]) [id number] Page 2 of 9 []  - 0 Complex Wound Assessment / Reassessment - multiple wounds []  - 0 Dermatologic / Skin Assessment (not related to wound area) ASSESSMENTS - Focused Assessment []  - 0 Circumferential Edema Measurements - multi extremities []  - 0 Nutritional Assessment / Counseling / Intervention X- 1 5 Lower Extremity Assessment (monofilament, tuning fork, pulses) []  - 0 Peripheral Arterial Disease Assessment (using hand held doppler) ASSESSMENTS - Ostomy and/or Continence Assessment and Care []  - 0 Incontinence Assessment and Management []  - 0 Ostomy Care Assessment and Management (repouching, etc.) PROCESS - Coordination of Care X - Simple Patient / Family Education for ongoing care 1 15 []  - 0 Complex (extensive) Patient / Family Education for ongoing care X- 1 10 Staff obtains Chiropractor, Records, T Results / Process Orders est []  - 0 Staff telephones HHA, Nursing Homes / Clarify orders / etc []  - 0 Routine Transfer to another Facility (non-emergent condition) []  - 0 Routine Hospital Admission (non-emergent condition) []  - 0 New Admissions / Manufacturing engineer / Ordering NPWT Apligraf, etc. , []  - 0 Emergency Hospital Admission (emergent condition) X- 1 10 Simple Discharge Coordination []  - 0 Complex (extensive) Discharge Coordination PROCESS - Special Needs []  - 0 Pediatric / Minor Patient Management []  - 0 Isolation Patient Management []  - 0 Hearing / Language / Visual special needs []  - 0 Assessment of Community assistance (transportation, King/C planning, etc.) []  - 0 Additional assistance / Altered mentation []  - 0 Support Surface(s) Assessment (bed, cushion, seat, etc.) INTERVENTIONS - Wound Cleansing /  Measurement X - Simple Wound Cleansing - one wound 1 5 []  - 0 Complex Wound Cleansing - multiple wounds X- 1 5 Wound Imaging (photographs - any number of  wounds) []  - 0 Wound Tracing (instead of photographs) X- 1 5 Simple Wound Measurement - one wound []  - 0 Complex Wound Measurement - multiple wounds INTERVENTIONS - Wound Dressings []  - 0 Small Wound Dressing one or multiple wounds X- 1 15 Medium Wound Dressing one or multiple wounds []  - 0 Large Wound Dressing one or multiple wounds X- 1 5 Application of Medications - topical []  - 0 Application of Medications - injection INTERVENTIONS - Miscellaneous []  - 0 External ear exam []  - 0 Specimen Collection (cultures, biopsies, blood, body fluids, etc.) []  - 0 Specimen(s) / Culture(s) sent or taken to Lab for analysis Dean King, Dean King ([id number]) [id number] Page 3 of 9 []  - 0 Patient Transfer (multiple staff / Nurse, adult / Similar devices) []  - 0 Simple Staple / Suture removal (25 or less) []  - 0 Complex Staple / Suture removal (26 or more) []  - 0 Hypo / Hyperglycemic Management (close monitor of Blood Glucose) []  - 0 Ankle / Brachial Index (ABI) - do not check if billed separately X- 1 5 Vital Signs Has the patient been seen at the hospital within the last three years: Yes Total Score: 100 Level Of Care: New/Established - Level 3 Electronic Signature(s) Signed: 02/26/2023 11:18:13 AM By: Angelina Pih Entered By: Angelina Pih on 02/26/2023 07:36:44 -------------------------------------------------------------------------------- Encounter Discharge Information Details Patient Name: Date of Service: Dean Croak King. 02/25/2023 9:45 A M Medical Record Number: [id number] Patient Account Number: [id number] Date of Birth/Sex: Treating RN: [birth year] (77 y.o. Dean King Primary Care Bradleigh Sonnen: Dewaine Oats Other Clinician: Referring Carlisle Torgeson: Treating Erikah Thumm/Extender: Dennie Bible Weeks in Treatment: 2 Encounter Discharge Information Items Discharge Condition: Stable Ambulatory Status: Ambulatory Discharge Destination: Home Transportation: Private Auto Accompanied By: family Schedule Follow-up Appointment: Yes Clinical Summary of Care: Notes MD Ravishankar in to see patient during his wound care visit. MD Ravishankar performed culture on wound and took labeled culture with her to place orders and stated would take to lab, PA Stone aware. MD Rivka Safer stated would be in contact with patient regarding results, PA Stone made MD aware PCR culture had been completed on 02/10/23 and notified her of results, MD stated understanding. Electronic Signature(s) Signed: 02/26/2023 7:39:55 AM By: Angelina Pih Entered By: Angelina Pih on 02/26/2023 07:39:55 Lower Extremity Assessment Details -------------------------------------------------------------------------------- Dean King ([id number]) [id number] Page 4 of 9 Patient Name: Date of Service: Dean King, Dean King 02/25/2023 9:45 A M Medical Record Number: [id number] Patient Account Number: [id number] Date of Birth/Sex: Treating RN: 1945/07/22 (77 y.o. Dean King Primary Care Lashica Hannay: Dewaine Oats Other Clinician: Referring Kendel Bessey: Treating Kameryn Davern/Extender: Dennie Bible Weeks in Treatment: 2 Edema Assessment Left: Right: Assessed: No No Edema: No Calf Left: Right: Point of Measurement: 35 cm From Medial Instep 36 cm Ankle Left: Right: Point of Measurement: 10 cm From Medial Instep 23.5 cm Vascular Assessment Left: Right: Pulses: Dorsalis Pedis Palpable: Yes Extremity colors, hair growth, and conditions: Extremity Color: Normal Hair Growth on Extremity: Yes Temperature of Extremity: Warm Capillary Refill: < 3 seconds Toe Nail Assessment Left: Right: Thick: Yes Discolored: No Deformed: No Improper Length and Hygiene: No Electronic  Signature(s) Signed: 02/26/2023 7:34:40 AM By: Angelina Pih Entered By: Angelina Pih on 02/26/2023 07:34:40 -------------------------------------------------------------------------------- Multi Wound Chart Details Patient Name: Date of Service: Dean Croak King. 02/25/2023 9:45 A M Medical Record Number: [id number] Patient Account Number: [id number] Date of Birth/Sex: Treating RN: [birth year] (78 y.o. Dean King Primary Care Alfhild Partch: Dewaine Oats  Other Clinician: Referring Clydean Posas: Treating Maxamus Colao/Extender: Angela Cox in Treatment: 2 Vital Signs Height(in): 70 Pulse(bpm): 85 Weight(lbs): 167 Blood Pressure(mmHg): 116/60 Body Mass Index(BMI): 24 Temperature(F): 97.5 Respiratory Rate(breaths/min): [address] ([id number]):Wound Assessments] [[id number] Page 5 of [phone].pdf Page 5 of 9] Wound Number: 1 N/A N/A Photos: N/A N/A Right Amputation Site - Toe N/A N/A Wound Location: Surgical Injury N/A N/A Wounding Event: Diabetic Wound/Ulcer of the Lower N/A N/A Primary Etiology: Extremity Arrhythmia, Congestive Heart Failure, N/A N/A Comorbid History: Coronary Artery Disease, Hypertension, Type II Diabetes, Osteomyelitis 01/16/2023 N/A N/A Date Acquired: 2 N/A N/A Weeks of Treatment: Open N/A N/A Wound Status: No N/A N/A Wound Recurrence: Yes N/A N/A Pending A mputation on Presentation: 2.5x3.5x0.5 N/A N/A Measurements L x W x King (cm) 6.872 N/A N/A A (cm) : rea 3.436 N/A N/A Volume (cm) : -25.00% N/A N/A % Reduction in A rea: 37.50% N/A N/A % Reduction in Volume: Grade 3 N/A N/A Classification: Medium N/A N/A Exudate A mount: Serosanguineous N/A N/A Exudate Type: red, brown N/A N/A Exudate Color: Small (1-33%) N/A N/A Granulation A mount: Red, Pink N/A N/A Granulation Quality: Large (67-100%) N/A N/A Necrotic A mount: Fat Layer (Subcutaneous Tissue): Yes N/A  N/A Exposed Structures: None N/A N/A Epithelialization: Treatment Notes Electronic Signature(s) Signed: 02/26/2023 7:34:56 AM By: Angelina Pih Entered By: Angelina Pih on 02/26/2023 07:34:56 -------------------------------------------------------------------------------- Multi-Disciplinary Care Plan Details Patient Name: Date of Service: Dean Croak King. 02/25/2023 9:45 A M Medical Record Number: [id number] Patient Account Number: [id number] Date of Birth/Sex: Treating RN: 1945/09/11 (77 y.o. Dean King Primary Care Maxwell Lemen: Dewaine Oats Other Clinician: Referring Pressley Tadesse: Treating Breckyn Ticas/Extender: Dennie Bible Weeks in Treatment: 2 Active Inactive Necrotic Tissue Nursing Diagnoses: Impaired tissue integrity related to necrotic/devitalized tissue Knowledge deficit related to management of necrotic/devitalized tissue Dean King, Dean King ([id number]) [id number] Page 6 of 9 Goals: Necrotic/devitalized tissue will be minimized in the wound bed Date Initiated: 02/10/2023 Target Resolution Date: 04/07/2023 Goal Status: Active Patient/caregiver will verbalize understanding of reason and process for debridement of necrotic tissue Date Initiated: 02/10/2023 Date Inactivated: 02/10/2023 Target Resolution Date: 02/10/2023 Goal Status: Met Interventions: Assess patient pain level pre-, during and post procedure and prior to discharge Provide education on necrotic tissue and debridement process Treatment Activities: Apply topical anesthetic as ordered : 02/10/2023 Excisional debridement : 02/10/2023 Notes: Wound/Skin Impairment Nursing Diagnoses: Impaired tissue integrity Knowledge deficit related to ulceration/compromised skin integrity Goals: Ulcer/skin breakdown will have a volume reduction of 30% by week 4 Date Initiated: 02/10/2023 Target Resolution Date: 03/10/2023 Goal Status: Active Ulcer/skin breakdown will have a volume  reduction of 50% by week 8 Date Initiated: 02/10/2023 Target Resolution Date: 04/07/2023 Goal Status: Active Ulcer/skin breakdown will have a volume reduction of 80% by week 12 Date Initiated: 02/10/2023 Target Resolution Date: 05/05/2023 Goal Status: Active Ulcer/skin breakdown will heal within 14 weeks Date Initiated: 02/10/2023 Target Resolution Date: 05/19/2023 Goal Status: Active Interventions: Assess patient/caregiver ability to obtain necessary supplies Assess patient/caregiver ability to perform ulcer/skin care regimen upon admission and as needed Assess ulceration(s) every visit Provide education on ulcer and skin care Notes: HBO discussed with patient and spouse Electronic Signature(s) Signed: 02/26/2023 7:36:58 AM By: Angelina Pih Entered By: Angelina Pih on 02/26/2023 07:36:58 -------------------------------------------------------------------------------- Pain Assessment Details Patient Name: Date of Service: Dean Croak King. 02/25/2023 9:45 A M Medical Record Number: [id number] Patient Account Number: [id number] Date of Birth/Sex: Treating RN: 1945-05-09 (77 y.o. Dean King Primary Care Vence Lalor: Arlana Pouch,  Katherina Right Other Clinician: Referring Sabel Hornbeck: Treating Yahsir Wickens/Extender: Dennie Bible Weeks in Treatment: [address] TIERRA, PARELLO King ([id number]) [id number] Page 7 of 9 Location of Pain Severity and Description of Pain Patient Has Paino Yes Site Locations Rate the pain. Current Pain Level: 3 Pain Management and Medication Current Pain Management: Electronic Signature(s) Signed: 02/26/2023 7:32:54 AM By: Angelina Pih Entered By: Angelina Pih on 02/26/2023 07:32:54 -------------------------------------------------------------------------------- Patient/Caregiver Education Details Patient Name: Date of Service: Dean King 12/3/2024andnbsp9:45 A M Medical Record Number: [id number] Patient Account  Number: [id number] Date of Birth/Gender: Treating RN: Nov 02, 1945 (77 y.o. Dean King Primary Care Physician: Dewaine Oats Other Clinician: Referring Physician: Treating Physician/Extender: Dennie Bible Weeks in Treatment: 2 Education Assessment Education Provided To: Patient Education Topics Provided Wound/Skin Impairment: Handouts: Caring for Your Ulcer Methods: Explain/Verbal Responses: State content correctly Electronic Signature(s) Signed: 02/26/2023 11:18:13 AM By: Angelina Pih Entered By: Angelina Pih on 02/26/2023 07:37:07 Dean King ([id number]) [id number] Page 8 of 9 -------------------------------------------------------------------------------- Wound Assessment Details Patient Name: Date of Service: Dean King, Dean King 02/25/2023 9:45 A M Medical Record Number: [id number] Patient Account Number: [id number] Date of Birth/Sex: Treating RN: [birth year] (77 y.o. 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 Wound Open Wounding Event: Surgical Injury Status: Date Acquired: 01/16/2023 Comorbid Arrhythmia, Congestive Heart Failure, Coronary Artery Disease, Weeks Of Treatment: 2 History: Hypertension, Type II Diabetes, Osteomyelitis Clustered Wound: No Pending Amputation On Presentation Photos Wound Measurements Length: (cm) 2.5 Width: (cm) 3.5 Depth: (cm) 0.5 Area: (cm) 6.872 Volume: (cm) 3.436 % Reduction in Area: -25% % Reduction in Volume: 37.5% Epithelialization: None Tunneling: No Undermining: No Wound Description Classification: Grade 3 Exudate Amount: Medium Exudate Type: Serosanguineous Exudate Color: red, brown Foul Odor After Cleansing:  No Slough/Fibrino Yes Wound Bed Granulation Amount: Small (1-33%) Exposed Structure Granulation Quality: Red, Pink Fat Layer (Subcutaneous Tissue) Exposed: Yes Necrotic Amount: Large (67-100%) Necrotic Quality: Adherent Slough Treatment Notes Wound #1 (Amputation Site - Toe) Wound Laterality: Right Cleanser Byram Ancillary Kit - 15 Day Supply Discharge Instruction: Use supplies as instructed; Kit contains: (15) Saline Bullets; (15) 3x3 Gauze; 15 pr Gloves Soap and Water Discharge Instruction: Gently cleanse wound with antibacterial soap, rinse and pat dry prior to dressing wounds Wound Cleanser Dean King, Dean King ([id number]) [id number] Page 9 of 9 Discharge Instruction: Wash your hands with soap and water. Remove old dressing, discard into plastic bag and place into trash. Cleanse the wound with Wound Cleanser prior to applying a clean dressing using gauze sponges, not tissues or cotton balls. Do not scrub or use excessive force. Pat dry using gauze sponges, not tissue or cotton balls. Peri-Wound Care Topical Primary Dressing IODOFLEX 0.9% Cadexomer Iodine Pad Discharge Instruction: Apply Iodoflex to wound bed only as directed. Secondary Dressing Zetuvit Plus 4x4 (in/in) Secured With Medipore T - 24M Medipore H Soft Cloth Surgical T ape ape, 2x2 (in/yd) Kerlix Roll Sterile or Non-Sterile 6-ply 4.5x4 (yd/yd) Discharge Instruction: Apply Kerlix as directed Compression Wrap Compression Stockings Add-Ons Electronic Signature(s) Signed: 02/26/2023 7:34:07 AM By: Angelina Pih Entered By: Angelina Pih on 02/26/2023 07:34:06 -------------------------------------------------------------------------------- Vitals Details Patient Name: Date of Service: Dean Croak King. 02/25/2023 9:45 A M Medical Record Number: [id number] Patient Account Number: [id number] Date of Birth/Sex: Treating RN: [birth year] (77 y.o. Dean King  Primary Care Shameria Trimarco: Dewaine Oats Other Clinician: Referring Franz Svec: Treating Enid Maultsby/Extender: Dennie Bible Weeks in Treatment: 2 Vital Signs Time Taken: 09:50 Temperature (F): 97.5 Height (in): 70 Pulse (bpm): 85 Weight (lbs): 167 Respiratory Rate (breaths/min): 18 Body Mass Index (BMI): 24 Blood Pressure (mmHg): 116/60 Reference Range: 80 - 120 mg / dl Electronic Signature(s) Signed: 02/26/2023 7:32:47 AM By: Angelina Pih Entered By: Angelina Pih on 02/26/2023 07:32:47

## 2023-02-27 ENCOUNTER — Ambulatory Visit
Admission: RE | Admit: 2023-02-27 | Discharge: 2023-02-27 | Disposition: A | Discharge status: Home / Self Care | Payer: Medicare Other | Source: Ambulatory Visit | Attending: Infectious Diseases | Admitting: Infectious Diseases

## 2023-02-27 ENCOUNTER — Inpatient Hospital Stay
Admission: RE | Admit: 2023-02-27 | Discharge: 2023-02-27 | Disposition: A | Discharge status: Home / Self Care | Payer: Self-pay | Source: Ambulatory Visit | Attending: Infectious Diseases | Admitting: Infectious Diseases

## 2023-02-27 ENCOUNTER — Other Ambulatory Visit: Payer: Self-pay | Admitting: Infectious Diseases

## 2023-02-27 VITALS — BP 98/47 | HR 71 | Temp 97.0°F | Resp 16 | Ht 70.0 in | Wt 168.1 lb

## 2023-02-27 DIAGNOSIS — M86171 Other acute osteomyelitis, right ankle and foot: Secondary | ICD-10-CM | POA: Diagnosis present

## 2023-02-27 DIAGNOSIS — E11628 Type 2 diabetes mellitus with other skin complications: Secondary | ICD-10-CM

## 2023-02-27 LAB — COMPREHENSIVE METABOLIC PANEL
ALT: 12 U/L (ref 0–44)
AST: 18 U/L (ref 15–41)
Albumin: 3.3 g/dL — ABNORMAL LOW (ref 3.5–5.0)
Alkaline Phosphatase: 64 U/L (ref 38–126)
Anion gap: 10 (ref 5–15)
BUN: 27 mg/dL — ABNORMAL HIGH (ref 8–23)
CO2: 28 mmol/L (ref 22–32)
Calcium: 9.3 mg/dL (ref 8.9–10.3)
Chloride: 99 mmol/L (ref 98–111)
Creatinine, Ser: 1.53 mg/dL — ABNORMAL HIGH (ref 0.61–1.24)
GFR, Estimated: 47 mL/min — ABNORMAL LOW (ref >60)
Glucose, Bld: 138 mg/dL — ABNORMAL HIGH (ref 70–99)
Potassium: 4.3 mmol/L (ref 3.5–5.1)
Sodium: 137 mmol/L (ref 135–145)
Total Bilirubin: 0.5 mg/dL (ref <1.2)
Total Protein: 7.1 g/dL (ref 6.5–8.1)

## 2023-02-27 LAB — C-REACTIVE PROTEIN: CRP: 1.1 mg/dL — ABNORMAL HIGH (ref <1.0)

## 2023-02-27 LAB — CBC WITH DIFFERENTIAL/PLATELET
Abs Immature Granulocytes: 0.04 10*3/uL (ref 0.00–0.07)
Basophils Absolute: 0 10*3/uL (ref 0.0–0.1)
Basophils Relative: 0 %
Eosinophils Absolute: 0.3 10*3/uL (ref 0.0–0.5)
Eosinophils Relative: 3 %
HCT: 30.9 % — ABNORMAL LOW (ref 39.0–52.0)
Hemoglobin: 10 g/dL — ABNORMAL LOW (ref 13.0–17.0)
Immature Granulocytes: 0 %
Lymphocytes Relative: 15 %
Lymphs Abs: 1.7 10*3/uL (ref 0.7–4.0)
MCH: 27.4 pg (ref 26.0–34.0)
MCHC: 32.4 g/dL (ref 30.0–36.0)
MCV: 84.7 fL (ref 80.0–100.0)
Monocytes Absolute: 0.6 10*3/uL (ref 0.1–1.0)
Monocytes Relative: 6 %
Neutro Abs: 8.4 10*3/uL — ABNORMAL HIGH (ref 1.7–7.7)
Neutrophils Relative %: 76 %
Platelets: 311 10*3/uL (ref 150–400)
RBC: 3.65 MIL/uL — ABNORMAL LOW (ref 4.22–5.81)
RDW: 20.5 % — ABNORMAL HIGH (ref 11.5–15.5)
WBC: 11.1 10*3/uL — ABNORMAL HIGH (ref 4.0–10.5)
nRBC: 0 % (ref 0.0–0.2)

## 2023-02-27 LAB — SEDIMENTATION RATE: Sed Rate: 58 mm/h — ABNORMAL HIGH (ref 0–20)

## 2023-02-27 MED ORDER — SODIUM CHLORIDE 0.9% FLUSH
10.0000 mL | Freq: Two times a day (BID) | INTRAVENOUS | Status: DC
Start: 2023-02-27 — End: 2023-02-28
  Administered 2023-02-27: 10 mL

## 2023-02-27 MED ORDER — CHLORHEXIDINE GLUCONATE CLOTH 2 % EX PADS: 6.0000 | MEDICATED_PAD | Freq: Every day | CUTANEOUS | Status: DC

## 2023-02-27 MED ORDER — SODIUM CHLORIDE 0.9% FLUSH
10.0000 mL | INTRAVENOUS | Status: DC | PRN
Start: 2023-02-27 — End: 2023-02-28

## 2023-02-27 MED ORDER — SODIUM CHLORIDE 0.9 % IV SOLN
2.0000 g | Freq: Once | INTRAVENOUS | Status: AC
  Administered 2023-02-27: 2 g via INTRAVENOUS
  Filled 2023-02-27: qty 12.5

## 2023-02-27 NOTE — Progress Notes (Signed)
GILLES, HUSTEAD ([id number]) [id number] Page 1 of 7 Visit Report for 02/25/2023 Chief Complaint Document Details Patient Name: Date of Service: Dean King, Dean King 02/25/2023 9:45 A M Medical Record Number: [id number] Patient Account Number: [id number] Date of Birth/Sex: Treating RN: 03/30/1945 (77 y.o. 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 02/25/2023 9:45 A M Medical Record Number: [id number] Patient Account Number: [id number] Date of Birth/Sex: Treating RN: 11/04/1945 (77 y.o. [name] Primary Care Provider: Dewaine Oats Other Clinician: Referring Provider: Treating Provider/Extender: Dennie Bible Weeks in Treatment: 2 History of Present Illness HPI Description: Epic Notes: The right common femoral is widely patent as is the profunda femoris. The SFA and popliteal have been stented in the past and the stents are widely patent. The trifurcation is diseased with occlusion of the anterior tibial artery, at the very distalmost aspect of the tibioperoneal trunk there is a greater than 90% stenosis which obstructs flow to the peroneal and posterior tibial arteries. Then approximately 3 to 5 cm distal to this lesion there is a greater than 70% stenosis in the posterior tibial. Lastly, as described above in the distalmost aspect of the posterior tibial and the plantar artery there is a diffuse greater than 80% stenosis. Following angioplasty the right posterior tibial  now is now widely patent including the distal plantar branch and demonstrates in-line flow with less than 20% residual stenosis. It is much improved and looks quite nice. Angioplasty and stent placement of the right external iliac artery also yields an excellent result with less than 10% residual stenosis. Summary: Successful recanalization right lower extremity for limb salvage Admission to Wound Care Center: 02-10-2023 upon evaluation today patient appears to be doing somewhat poorly in regard to a wound that occurred as a result of initially a callus this was back Dean King ([id number]) [id number] Page 2 of 7 in around March. Subsequently the patient tells me that he following this time in March ended up with an open wound he was seeing Dr. Allena Katz and slowly this open wound got worse to the point that he actually ended up having to go for surgery this was a ray amputation and that actually occurred on January 16, 2023. The patient is also been revascularized to have that note above which was reviewed in epic. It does appear that he had 90% stenosis of the peroneal and posterior tibial arteries after intervention this was a less than 10% residual stenosis he did have balloon angioplasty and stent. The patient's most recent hemoglobin A1c was 7.8 on 01-14-2023. 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 I did review labs from the patient's transplant doctor as he is a liver transplant patient did reveal that his CMP was essentially okay with no major complications he did have chronic kidney disease noted with a GFR of 54 subsequently when looking at his  white  blood cell count it was actually elevated at 11.7 hemoglobin was somewhat low at 11.1 also did subsequently check out as well his MRI which showed that he had had an osteomyelitis of the fifth metatarsal head and into the proximal phalanx. He had septic arthritis as well. Subsequently this was an MRI that was prior to the amputation on 01-15-2023 amputation which was a ray amputation was undertaken the next day. 02-17-2023 upon evaluation today patient appears to be doing well currently in regard to his wound which all things considered is actually doing quite well. Fortunately I do not see any signs of active infection at this time systemically locally he did test positive for Pseudomonas unfortunately the only oral medication for this is Levaquin he is allergic to Levaquin. For that reason we are going to actually continue with the recommendation currently for the topical Iodoflex which I think has helped they have Iodosorb which was given to them by supply I think that is perfectly fine essentially is good to do the same thing. Will try to get this wound cleaned out so that we can get it to heal. 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 02-25-2023 upon evaluation today patient appears to be doing pretty well currently in regard to his foot ulcer. Dr. Joylene Draft from infectious disease was here to see him as well today. She wanted another culture she was not wanting to go off of the PCR culture that we had done which showed Pseudomonas. She stated that PCR cultures can show everything in the wound which I explained I agree with but at the same time there are concentrations on percents that are assigned to this so that is not just a blanket everything that is in the wound. This helps to determine what may actually be causing issues or not  and again the Pseudomonas corresponds as well with what was seen in the deep pathology culture as well from when he was in the hospital. Nonetheless he has not been treated yet for anything related to the Pseudomonas due to the fact that he is allergic to Levaquin. Electronic Signature(s) Signed: 02/27/2023 7:39:18 AM By: [name] PA-C Entered By: [name] on 02/27/2023 04:09:04 -------------------------------------------------------------------------------- Physical Exam Details Patient Name: Date of Service: DONELLE, EICHNER D. 02/25/2023 9:45 A M Medical Record Number: [id number] Patient Account Number: [id number] Date of Birth/Sex: Treating RN: [birth year] (77 y.o. [name] Primary Care Provider: Dewaine Oats Other Clinician: Referring Provider: Treating Provider/Extender: Dennie Bible Weeks in Treatment: 2 Constitutional Well-nourished and well-hydrated in no acute distress. Respiratory normal breathing without difficulty. Psychiatric this patient is able to make decisions and demonstrates good insight into disease process. Alert and Oriented x 3. pleasant and cooperative. Notes Patient's wound bed actually showed signs of poor granulation and epithelization at this point. He still has some need for debridement we are using the Iodoflex because of the fact that he was hurting so much today from the culture that had to be taken we did not perform any other debridements today for you see how things proceed. Electronic Signature(s) Signed: 02/27/2023 7:39:18 AM By: [name] PA-C Entered By: [name] on 02/27/2023 04:09:23 Jerene Pitch ([id number]) [id number] Page 3 of 7 -------------------------------------------------------------------------------- Physician Orders Details Patient Name: Date of Service: DEWAUN, GILLION 02/25/2023 9:45 A M Medical Record Number:  [id number] Patient Account Number: [id number] Date of Birth/Sex:  Treating RN: Feb 28, 1946 (77 y.o. [name] Primary Care Provider: Dewaine Oats Other Clinician: Referring Provider: Treating Provider/Extender: Dennie Bible Weeks in Treatment: 2 The following information was scribed by: Angelina Pih The information was scribed for: [name] Verbal / Phone Orders: No Diagnosis Coding Follow-up Appointments Return Appointment in 1 week. Bathing/ Shower/ Hygiene May shower with wound dressing protected with water repellent cover or cast protector. No tub bath. Anesthetic (Use 'Patient Medications' Section for Anesthetic Order Entry) Lidocaine applied to wound bed Edema Control - Orders / Instructions Elevate, Exercise Daily and A void Standing for Long Periods of Time. Elevate legs to the level of the heart and pump ankles as often as possible Elevate leg(s) parallel to the floor when sitting. DO YOUR BEST to sleep in the bed at night. DO NOT sleep in your recliner. Long hours of sitting in a recliner leads to swelling of the legs and/or potential wounds on your backside. Off-Loading Open toe surgical shoe - front off loader Hyperbaric Oxygen Therapy Wound #1 Right Amputation Site - Toe Other - discussed with patient at his new pt appointment Wound Treatment Wound #1 - Amputation Site - Toe Wound Laterality: Right Cleanser: Byram Ancillary Kit - 15 Day Supply (Generic) 3 x Per Week/15 Days Discharge Instructions: Use supplies as instructed; Kit contains: (15) Saline Bullets; (15) 3x3 Gauze; 15 pr Gloves Cleanser: Soap and Water 3 x Per Week/15 Days Discharge Instructions: Gently cleanse wound with antibacterial soap, rinse and pat dry prior to dressing wounds Cleanser: Wound Cleanser 3 x Per Week/15 Days Discharge Instructions: Wash your hands with soap and water. Remove old dressing, discard into plastic bag and place into trash. Cleanse the wound with Wound Cleanser prior to applying a clean dressing using gauze sponges,  not tissues or cotton balls. Do not scrub or use excessive force. Pat dry using gauze sponges, not tissue or cotton balls. Prim Dressing: IODOFLEX 0.9% Cadexomer Iodine Pad (Generic) 3 x Per Week/15 Days ary Discharge Instructions: Apply Iodoflex to wound bed only as directed. Secondary Dressing: Zetuvit Plus 4x4 (in/in) (DME) (Generic) 3 x Per Week/15 Days Secured With: Medipore T - 61M Medipore H Soft Cloth Surgical T ape ape, 2x2 (in/yd) (DME) (Generic) 3 x Per Week/15 Days Secured With: Kerlix Roll Sterile or Non-Sterile 6-ply 4.5x4 (yd/yd) (DME) (Generic) 3 x Per Week/15 Days Discharge Instructions: Apply Kerlix as directed Electronic Signature(s) Signed: 02/26/2023 7:36:22 AM By: Ree Kida D ([id number]) [id number] Page 4 of 7 Signed: 02/27/2023 7:39:18 AM By: [name] PA-C Entered By: Angelina Pih on 02/26/2023 04:36:22 -------------------------------------------------------------------------------- Problem List Details Patient Name: Date of Service: Dean Croak D. 02/25/2023 9:45 A M Medical Record Number: [id number] Patient Account Number: [id number] Date of Birth/Sex: Treating RN: 12-16-1945 (77 y.o. [name] Primary Care Provider: Dewaine Oats Other Clinician: Referring Provider: Treating Provider/Extender: Dennie Bible Weeks in Treatment: 2 Active Problems ICD-10 Encounter Code Description Active Date MDM Diagnosis M86.371 Chronic multifocal osteomyelitis, right ankle and foot 02/10/2023 No Yes T81.31XA Disruption of external operation (surgical) wound, not elsewhere classified, 02/10/2023 No Yes initial encounter E11.621 Type 2 diabetes mellitus with foot ulcer 02/10/2023 No Yes L97.512 Non-pressure chronic ulcer of other part of right foot with fat layer exposed 02/10/2023 No Yes I25.10 Atherosclerotic heart disease of native coronary artery without angina pectoris 02/10/2023 No Yes I50.42 Chronic  combined systolic (congestive) and diastolic (congestive) heart failure 02/10/2023 No Yes  I48.0 Paroxysmal atrial fibrillation 02/10/2023 No Yes Z79.01 Long term (current) use of anticoagulants 02/10/2023 No Yes Inactive Problems Resolved Problems Electronic Signature(s) Signed: 02/27/2023 7:39:18 AM By: [name] PA-C Entered By: [name] on 02/27/2023 78:29:56 Vickii Penna D ([id number]) [id number] Page 5 of 7 -------------------------------------------------------------------------------- Progress Note Details Patient Name: Date of Service: KIPP, HONER 02/25/2023 9:45 A M Medical Record Number: [id number] Patient Account Number: [id number] Date of Birth/Sex: Treating RN: 10/07/1945 (77 y.o. 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 The SFA and popliteal have been stented in the past and the stents are widely patent. The trifurcation is diseased with occlusion of the anterior tibial artery, at the very distalmost aspect of the tibioperoneal trunk there is a greater than 90% stenosis which obstructs flow to the peroneal and posterior tibial arteries. Then approximately 3 to 5 cm distal to this lesion there is a greater than 70% stenosis in the posterior tibial. Lastly, as described above in the distalmost aspect of the posterior tibial and the plantar artery there is a diffuse greater than 80% stenosis. Following angioplasty the right posterior tibial now is now widely patent including the distal plantar branch and demonstrates in-line flow with less than 20% residual stenosis. It is much improved and looks  quite nice. Angioplasty and stent placement of the right external iliac artery also yields an excellent result with less than 10% residual stenosis. Summary: Successful recanalization right lower extremity for limb salvage Admission to Wound Care Center: 02-10-2023 upon evaluation today patient appears to be doing somewhat poorly in regard to a wound that occurred as a result of initially a callus this was back in around March. Subsequently the patient tells me that he following this time in March ended up with an open wound he was seeing Dr. Allena Katz and slowly this open wound got worse to the point that he actually ended up having to go for surgery this was a ray amputation and that actually occurred on January 16, 2023. The patient is also been revascularized to have that note above which was reviewed in epic. It does appear that he had 90% stenosis of the peroneal and posterior tibial arteries after intervention this was a less than 10% residual stenosis he did have balloon angioplasty and stent. The patient's most recent hemoglobin A1c was 7.8 on 01-14-2023. 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 I did review labs from the patient's transplant doctor as he is a liver transplant patient did reveal that his CMP was essentially okay with no major complications he did have chronic kidney disease noted with a GFR of 54 subsequently when looking at his white blood cell count it was actually elevated at 11.7 hemoglobin was somewhat low at 11.1 also did subsequently check out as well his MRI which showed that he had had an osteomyelitis of the fifth metatarsal head and into the proximal  phalanx. He had septic arthritis  as well. Subsequently this was an MRI that was prior to the amputation on 01-15-2023 amputation which was a ray amputation was undertaken the next day. 02-17-2023 upon evaluation today patient appears to be doing well currently in regard to his wound which all things considered is actually doing quite well. Fortunately I do not see any signs of active infection at this time systemically locally he did test positive for Pseudomonas unfortunately the only oral medication for this is Levaquin he is allergic to Levaquin. For that reason we are going to actually continue with the recommendation currently for the topical Iodoflex which I think has helped they have Iodosorb which was given to them by supply I think that is perfectly fine essentially is good to do the same thing. Will try to get this wound cleaned out so that we can get it to heal. 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 02-25-2023 upon evaluation today patient appears to be doing pretty well currently in regard to his foot ulcer. Dr. Joylene Draft from infectious disease was here to see him as well today. She wanted another culture she was not wanting to go off of the PCR culture that we had done which showed Pseudomonas. She stated that PCR cultures can show everything in the wound which I explained I agree with but at the same time there are concentrations on percents that are assigned to this so that is not just a blanket everything that is in the wound. This helps to determine what may actually be causing issues or not and again the Pseudomonas corresponds as well with what was seen in the deep pathology culture as well from when he was in the hospital. Nonetheless he has not been treated yet for anything related to the Pseudomonas due to the fact  that he is allergic to Levaquin. [address] MAJEED, TAMASHIRO ([id number]) [id number] Page 6 of 7 Constitutional Well-nourished and well-hydrated in no acute distress. Vitals Time Taken: 9:50 AM, Height: 70 in, Weight: 167 lbs, BMI: 24, Temperature: 97.5 F, Pulse: 85 bpm, Respiratory Rate: 18 breaths/min, Blood Pressure: 116/60 mmHg. Respiratory normal breathing without difficulty. Psychiatric this patient is able to make decisions and demonstrates good insight into disease process. Alert and Oriented x 3. pleasant and cooperative. General Notes: Patient's wound bed actually showed signs of poor granulation and epithelization at this point. He still has some need for debridement we are using the Iodoflex because of the fact that he was hurting so much today from the culture that had to be taken we did not perform any other debridements today for you see how things proceed. Integumentary (Hair, Skin) Wound #1 status is Open. Original cause of wound was Surgical Injury. The date acquired was: 01/16/2023. The wound has been in treatment 2 weeks. The wound is located on the Right Amputation Site - T The wound measures 2.5cm length x 3.5cm width x 0.5cm depth; 6.872cm^2 area and 3.436cm^3 volume. oe. There is Fat Layer (Subcutaneous Tissue) exposed. There is no tunneling or undermining noted. There is a medium amount of serosanguineous drainage noted. There is small (1-33%) red, pink granulation within the wound bed. There is a large (67-100%) amount of necrotic tissue within the wound bed including Adherent Slough. Assessment Active Problems ICD-10 Chronic multifocal osteomyelitis, right ankle and foot Disruption of external operation (surgical) wound, not elsewhere classified,  initial encounter Type 2 diabetes mellitus with foot ulcer Non-pressure chronic ulcer of other part of right foot with fat layer exposed Atherosclerotic heart disease of native coronary artery  without angina pectoris Chronic combined systolic (congestive) and diastolic (congestive) heart failure Paroxysmal atrial fibrillation Long term (current) use of anticoagulants Plan Follow-up Appointments: Return Appointment in 1 week. Bathing/ Shower/ Hygiene: May shower with wound dressing protected with water repellent cover or cast protector. No tub bath. Anesthetic (Use 'Patient Medications' Section for Anesthetic Order Entry): Lidocaine applied to wound bed Edema Control - Orders / Instructions: Elevate, Exercise Daily and Avoid Standing for Long Periods of Time. Elevate legs to the level of the heart and pump ankles as often as possible Elevate leg(s) parallel to the floor when sitting. DO YOUR BEST to sleep in the bed at night. DO NOT sleep in your recliner. Long hours of sitting in a recliner leads to swelling of the legs and/or potential wounds on your backside. Off-Loading: Open toe surgical shoe - front off loader Hyperbaric Oxygen Therapy: Wound #1 Right Amputation Site - T oe: Other - discussed with patient at his new pt appointment WOUND #1: - Amputation Site - T oe Wound Laterality: Right Cleanser: Byram Ancillary Kit - 15 Day Supply (Generic) 3 x Per Week/15 Days Discharge Instructions: Use supplies as instructed; Kit contains: (15) Saline Bullets; (15) 3x3 Gauze; 15 pr Gloves Cleanser: Soap and Water 3 x Per Week/15 Days Discharge Instructions: Gently cleanse wound with antibacterial soap, rinse and pat dry prior to dressing wounds Cleanser: Wound Cleanser 3 x Per Week/15 Days Discharge Instructions: Wash your hands with soap and water. Remove old dressing, discard into plastic bag and place into trash. Cleanse the wound with Wound Cleanser prior to applying a clean dressing using gauze sponges, not tissues or cotton balls. Do not scrub or use excessive force. Pat dry using gauze sponges, not tissue or cotton balls. Prim Dressing: IODOFLEX 0.9% Cadexomer Iodine Pad  (Generic) 3 x Per Week/15 Days ary Discharge Instructions: Apply Iodoflex to wound bed only as directed. Secondary Dressing: Zetuvit Plus 4x4 (in/in) (DME) (Generic) 3 x Per Week/15 Days Secured With: Medipore T - 67M Medipore H Soft Cloth Surgical T ape ape, 2x2 (in/yd) (DME) (Generic) 3 x Per Week/15 Days Secured With: [employer] Sterile or Non-Sterile 6-ply 4.5x4 (yd/yd) (DME) (Generic) 3 x Per Week/15 Days Discharge Instructions: Apply Kerlix as directed 1. I would recommend that we have the patient going to continue to monitor for any signs of infection or worsening. Based on what I am seeing I do believe that we will making headway here towards closure although this is very slow. 2. We are going to hopefully get some IV antibiotics started once the results of the culture come back. CHEVIS, LOUNDS ([id number]) [id number] Page 7 of 7 3. I am going to recommend patient should continue to monitor for any signs of infection and if anything changes he should contact the office and let me know. We will see patient back for reevaluation in 1 week here in the clinic. If anything worsens or changes patient will contact our office for additional recommendations. Electronic Signature(s) Signed: 02/27/2023 7:39:18 AM By: [name] PA-C Entered By: [name] on 02/27/2023 04:10:00 -------------------------------------------------------------------------------- SuperBill Details Patient Name: Date of Service: Dean Croak D. 02/25/2023 Medical Record Number: [id number] Patient Account Number: [id number] Date of Birth/Sex: Treating RN: 1945/05/13 (77 y.o. [name] Primary Care Provider: Dewaine Oats Other Clinician: Referring Provider: Treating Provider/Extender:  Stone, Egbert Garibaldi, Katherina Right Weeks in Treatment: 2 Diagnosis Coding ICD-10 Codes Code Description [phone] Chronic multifocal osteomyelitis, right ankle and foot T81.31XA Disruption of external  operation (surgical) wound, not elsewhere classified, initial encounter E11.621 Type 2 diabetes mellitus with foot ulcer L97.512 Non-pressure chronic ulcer of other part of right foot with fat layer exposed I25.10 Atherosclerotic heart disease of native coronary artery without angina pectoris I50.42 Chronic combined systolic (congestive) and diastolic (congestive) heart failure I48.0 Paroxysmal atrial fibrillation Z79.01 Long term (current) use of anticoagulants Facility Procedures : CPT4 Code: [id number] Description: 99213 - WOUND CARE VISIT-LEV 3 EST PT Modifier: Quantity: 1 Physician Procedures : CPT4 Code Description Modifier [id number] 99213 - WC PHYS LEVEL 3 - EST PT ICD-10 Diagnosis Description M86.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

## 2023-02-27 NOTE — Progress Notes (Signed)
Peripherally Inserted Central Catheter Placement  The IV Nurse has discussed with the patient and/or persons authorized to consent for the patient, the purpose of this procedure and the potential benefits and risks involved with this procedure.  The benefits include less needle sticks, lab draws from the catheter, and the patient may be discharged home with the catheter. Risks include, but not limited to, infection, bleeding, blood clot (thrombus formation), and puncture of an artery; nerve damage and irregular heartbeat and possibility to perform a PICC exchange if needed/ordered by physician.  Alternatives to this procedure were also discussed.  Bard Power PICC patient education guide, fact sheet on infection prevention and patient information card has been provided to patient /or left at bedside.    PICC Placement Documentation  PICC Single Lumen 02/27/23 Right Basilic 41 cm 1 cm (Active)  Indication for Insertion or Continuance of Line Home intravenous therapies (PICC only) 02/27/23 1342  Exposed Catheter (cm) 1 cm 02/27/23 1342  Site Assessment Clean, Dry, Intact 02/27/23 1342  Line Status Flushed;Saline locked;Blood return noted 02/27/23 1342  Dressing Type Transparent;Securing device 02/27/23 1342  Dressing Status Antimicrobial disc in place;Clean, Dry, Intact 02/27/23 1342  Line Care Connections checked and tightened 02/27/23 1342  Line Adjustment (NICU/IV Team Only) No 02/27/23 1342  Dressing Intervention New dressing;Adhesive placed at insertion site (IV team only) 02/27/23 1342  Dressing Change Due 03/06/23 02/27/23 1342       [name] 02/27/2023, 1:43 PM

## 2023-02-28 LAB — AEROBIC CULTURE W GRAM STAIN (SUPERFICIAL SPECIMEN)

## 2023-03-04 ENCOUNTER — Encounter: Payer: Medicare Other | Admitting: Podiatry

## 2023-03-05 ENCOUNTER — Ambulatory Visit (INDEPENDENT_AMBULATORY_CARE_PROVIDER_SITE_OTHER): Payer: Medicare Other

## 2023-03-05 ENCOUNTER — Encounter (INDEPENDENT_AMBULATORY_CARE_PROVIDER_SITE_OTHER): Payer: Self-pay | Admitting: Nurse Practitioner

## 2023-03-05 ENCOUNTER — Ambulatory Visit (INDEPENDENT_AMBULATORY_CARE_PROVIDER_SITE_OTHER): Payer: Medicare Other | Admitting: Nurse Practitioner

## 2023-03-05 VITALS — BP 97/63 | HR 77 | Resp 18 | Ht 70.0 in | Wt 181.6 lb

## 2023-03-05 DIAGNOSIS — I70211 Atherosclerosis of native arteries of extremities with intermittent claudication, right leg: Secondary | ICD-10-CM

## 2023-03-05 DIAGNOSIS — I739 Peripheral vascular disease, unspecified: Secondary | ICD-10-CM | POA: Diagnosis not present

## 2023-03-05 DIAGNOSIS — Z9889 Other specified postprocedural states: Secondary | ICD-10-CM | POA: Diagnosis not present

## 2023-03-05 DIAGNOSIS — I10 Essential (primary) hypertension: Secondary | ICD-10-CM | POA: Diagnosis not present

## 2023-03-05 DIAGNOSIS — I63239 Cerebral infarction due to unspecified occlusion or stenosis of unspecified carotid arteries: Secondary | ICD-10-CM | POA: Diagnosis not present

## 2023-03-07 ENCOUNTER — Encounter: Payer: Medicare Other | Admitting: Physician Assistant

## 2023-03-07 DIAGNOSIS — L97512 Non-pressure chronic ulcer of other part of right foot with fat layer exposed: Secondary | ICD-10-CM | POA: Diagnosis not present

## 2023-03-07 NOTE — Progress Notes (Addendum)
Dean, King ([id number]) [id number] Page 1 of 9 Visit Report for 03/07/2023 Arrival Information Details Patient Name: Date of Service: Dean King, Dean King 03/07/2023 9:15 A M Medical Record Number: [id number] Patient Account Number: [id number] Date of Birth/Sex: Treating RN: 09/04/1945 (77 y.o. Dean King Primary Care Dean King: Dewaine Oats Other Clinician: Referring Dean King: Treating Dean King/Extender: Dean Bible King in Treatment: 3 Visit Information History Since Last Visit Added or deleted any medications: No Patient Arrived: Ambulatory Any new allergies or adverse reactions: No Arrival Time: 09:24 Had a fall or experienced change in No Accompanied By: family activities of daily living that may affect Transfer Assistance: None risk of falls: Patient Identification Verified: Yes Hospitalized since last visit: No Secondary Verification Process Completed: Yes Has Dressing in Place as Prescribed: Yes Patient Has Alerts: Yes Pain Present Now: No Patient Alerts: Patient on Blood Thinner type 2 diabetic Electronic Signature(s) Signed: 03/07/2023 12:18:53 PM By: Angelina Pih Entered By: Angelina Pih on 03/07/2023 06:25:50 -------------------------------------------------------------------------------- Clinic Level of Care Assessment Details Patient Name: Date of Service: Dean King, Dean King 03/07/2023 9:15 A M Medical Record Number: [id number] Patient Account Number: [id number] Date of Birth/Sex: Treating RN: [birth year] (77 y.o. Dean King Primary Care Kendrew Paci: Dewaine Oats Other Clinician: Referring Trianna Lupien: Treating Colston Pyle/Extender: Dean Bible King in Treatment: 3 Clinic Level of Care Assessment Items TOOL 1 Quantity Score []  - 0 Use when EandM and Procedure is performed on INITIAL visit ASSESSMENTS - Nursing Assessment / Reassessment []  - 0 General Physical Exam (combine w/ comprehensive  assessment (listed just below) when performed on new pt. evals) []  - 0 Comprehensive Assessment (HX, ROS, Risk Assessments, Wounds Hx, etc.) ASSESSMENTS - Wound and Skin Assessment / Reassessment []  - 0 Dermatologic / Skin Assessment (not related to wound area) ASSESSMENTS - Ostomy and/or Continence Assessment and Care Dean, King ([id number]) [id number] Page 2 of 9 []  - 0 Incontinence Assessment and Management []  - 0 Ostomy Care Assessment and Management (repouching, etc.) PROCESS - Coordination of Care []  - 0 Simple Patient / Family Education for ongoing care []  - 0 Complex (extensive) Patient / Family Education for ongoing care []  - 0 Staff obtains Chiropractor, Records, T Results / Process Orders est []  - 0 Staff telephones HHA, Nursing Homes / Clarify orders / etc []  - 0 Routine Transfer to another Facility (non-emergent condition) []  - 0 Routine Hospital Admission (non-emergent condition) []  - 0 New Admissions / Manufacturing engineer / Ordering NPWT Apligraf, etc. , []  - 0 Emergency Hospital Admission (emergent condition) PROCESS - Special Needs []  - 0 Pediatric / Minor Patient Management []  - 0 Isolation Patient Management []  - 0 Hearing / Language / Visual special needs []  - 0 Assessment of Community assistance (transportation, King/C planning, etc.) []  - 0 Additional assistance / Altered mentation []  - 0 Support Surface(s) Assessment (bed, cushion, seat, etc.) INTERVENTIONS - Miscellaneous []  - 0 External ear exam []  - 0 Patient Transfer (multiple staff / Nurse, adult / Similar devices) []  - 0 Simple Staple / Suture removal (25 or less) []  - 0 Complex Staple / Suture removal (26 or more) []  - 0 Hypo/Hyperglycemic Management (do not check if billed separately) []  - 0 Ankle / Brachial Index (ABI) - do not check if billed separately Has the patient been seen at the hospital within the last three years: Yes Total Score: 0 Level Of  Care: ____ Electronic Signature(s) Signed: 03/07/2023 12:18:53 PM By: Angelina Pih Entered By: Angelina Pih on  03/07/2023 07:10:23 -------------------------------------------------------------------------------- Encounter Discharge Information Details Patient Name: Date of Service: Dean, King 03/07/2023 9:15 A M Medical Record Number: [id number] Patient Account Number: [id number] Date of Birth/Sex: Treating RN: 21-Feb-1946 (77 y.o. Dean King Primary Care Krystopher Kuenzel: Dewaine Oats Other Clinician: Referring Taci Sterling: Treating Verley Pariseau/Extender: Dean Bible King in Treatment: 3 Encounter Discharge Information Items Post Procedure Vitals Discharge Condition: Stable Temperature (F): 97.6 Dean King, Dean King ([id number]) [id number] Page 3 of 9 Ambulatory Status: Ambulatory Pulse (bpm): 78 Discharge Destination: Home Respiratory Rate (breaths/min): 18 Transportation: Private Auto Blood Pressure (mmHg): 113/58 Accompanied By: family Schedule Follow-up Appointment: Yes Clinical Summary of Care: Electronic Signature(s) Signed: 03/07/2023 11:37:06 AM By: Angelina Pih Entered By: Angelina Pih on 03/07/2023 08:37:06 -------------------------------------------------------------------------------- Lower Extremity Assessment Details Patient Name: Date of Service: Dean King, Dean King. 03/07/2023 9:15 A M Medical Record Number: [id number] Patient Account Number: [id number] Date of Birth/Sex: Treating RN: 12-01-1945 (77 y.o. Dean King Primary Care Stephannie Broner: Dewaine Oats Other Clinician: Referring Meztli Llanas: Treating Dijuan Sleeth/Extender: Dean Bible King in Treatment: 3 Edema Assessment Assessed: [Left: No] [Right: No] Edema: [Left: Ye] [Right: s] Calf Left: Right: Point of Measurement: 35 cm From Medial Instep 39 cm Ankle Left: Right: Point of Measurement: 10 cm From Medial Instep 24.2 cm Vascular  Assessment Pulses: Dorsalis Pedis Palpable: [Right:Yes] Doppler Audible: [Right:Yes] Posterior Tibial Doppler Audible: [Right:Yes] Extremity colors, hair growth, and conditions: Extremity Color: [Right:Normal] Hair Growth on Extremity: [Right:Yes] Temperature of Extremity: [Right:Warm < 3 seconds] Toe Nail Assessment Left: Right: Thick: Yes Discolored: Yes Deformed: No Improper Length and Hygiene: No Notes cap refill sluggish Electronic Signature(s) Signed: 03/07/2023 12:18:53 PM By: Delfin Gant, Jillyn Hidden King ([id number]) [id number] Page 4 of 9 Signed: 03/07/2023 12:18:53 PM By: Angelina Pih Entered By: Angelina Pih on 03/07/2023 06:33:20 -------------------------------------------------------------------------------- Multi Wound Chart Details Patient Name: Date of Service: Dean Croak King. 03/07/2023 9:15 A M Medical Record Number: [id number] Patient Account Number: [id number] Date of Birth/Sex: Treating RN: 31-Jul-1945 (77 y.o. Dean King Primary Care Paelyn Smick: Dewaine Oats Other Clinician: Referring Armon Orvis: Treating Shenay Torti/Extender: Dean Bible King in Treatment: 3 Vital Signs Height(in): 70 Pulse(bpm): 78 Weight(lbs): 167 Blood Pressure(mmHg): 113/58 Body Mass Index(BMI): 24 Temperature(F): 97.6 Respiratory Rate(breaths/min): 18 [1:Photos:] [N/A:N/A] Right Amputation Site - Toe N/A N/A Wound Location: Surgical Injury N/A N/A Wounding Event: Diabetic Wound/Ulcer of the Lower N/A N/A Primary Etiology: Extremity Arrhythmia, Congestive Heart Failure, N/A N/A Comorbid History: Coronary Artery Disease, Hypertension, Type II Diabetes, Osteomyelitis 01/16/2023 N/A N/A Date Acquired: 3 N/A N/A King of Treatment: Open N/A N/A Wound Status: No N/A N/A Wound Recurrence: Yes N/A N/A Pending A mputation on Presentation: 2.2x2.4x0.3 N/A N/A Measurements L x W x King (cm) 4.147 N/A N/A A (cm)  : rea 1.244 N/A N/A Volume (cm) : 24.60% N/A N/A % Reduction in A rea: 77.40% N/A N/A % Reduction in Volume: Grade 3 N/A N/A Classification: Medium N/A N/A Exudate A mount: Serosanguineous N/A N/A Exudate Type: red, brown N/A N/A Exudate Color: Small (1-33%) N/A N/A Granulation A mount: Red, Pink N/A N/A Granulation Quality: Large (67-100%) N/A N/A Necrotic A mount: Fat Layer (Subcutaneous Tissue): Yes N/A N/A Exposed Structures: None N/A N/A Epithelialization: Treatment Notes Electronic Signature(s) Signed: 03/07/2023 12:18:53 PM By: Delfin Gant, Jillyn Hidden King ([id number]) [id number] Page 5 of 9 Entered By: Angelina Pih on 03/07/2023 07:04:04 -------------------------------------------------------------------------------- Multi-Disciplinary Care Plan Details Patient Name: Date of Service: Dean King, Dean King. 03/07/2023 9:15 A M Medical Record Number:  [id number] Patient Account Number: [id number] Date of Birth/Sex: Treating RN: [birth year] (77 y.o. Dean King Primary Care Krishiv Sandler: Dewaine Oats Other Clinician: Referring Miasia Crabtree: Treating Eilee Schader/Extender: Dean Bible King in Treatment: 3 Active Inactive Necrotic Tissue Nursing Diagnoses: Impaired tissue integrity related to necrotic/devitalized tissue Knowledge deficit related to management of necrotic/devitalized tissue Goals: Necrotic/devitalized tissue will be minimized in the wound bed Date Initiated: 02/10/2023 Target Resolution Date: 04/07/2023 Goal Status: Active Patient/caregiver will verbalize understanding of reason and process for debridement of necrotic tissue Date Initiated: 02/10/2023 Date Inactivated: 02/10/2023 Target Resolution Date: 02/10/2023 Goal Status: Met Interventions: Assess patient pain level pre-, during and post procedure and prior to discharge Provide education on necrotic tissue and debridement process Treatment  Activities: Apply topical anesthetic as ordered : 02/10/2023 Excisional debridement : 02/10/2023 Notes: Wound/Skin Impairment Nursing Diagnoses: Impaired tissue integrity Knowledge deficit related to ulceration/compromised skin integrity Goals: Ulcer/skin breakdown will have a volume reduction of 30% by week 4 Date Initiated: 02/10/2023 Target Resolution Date: 03/10/2023 Goal Status: Active Ulcer/skin breakdown will have a volume reduction of 50% by week 8 Date Initiated: 02/10/2023 Target Resolution Date: 04/07/2023 Goal Status: Active Ulcer/skin breakdown will have a volume reduction of 80% by week 12 Date Initiated: 02/10/2023 Target Resolution Date: 05/05/2023 Goal Status: Active Ulcer/skin breakdown will heal within 14 King Date Initiated: 02/10/2023 Target Resolution Date: 05/19/2023 Goal Status: Active Interventions: Assess patient/caregiver ability to obtain necessary supplies Assess patient/caregiver ability to perform ulcer/skin care regimen upon admission and as needed Assess ulceration(s) every visit Dean King, Dean King ([id number]) [id number] Page 6 of 9 Provide education on ulcer and skin care Notes: HBO discussed with patient and spouse Electronic Signature(s) Signed: 03/07/2023 12:18:53 PM By: Angelina Pih Entered By: Angelina Pih on 03/07/2023 07:10:39 -------------------------------------------------------------------------------- Pain Assessment Details Patient Name: Date of Service: Dean King, Dean King. 03/07/2023 9:15 A M Medical Record Number: [id number] Patient Account Number: [id number] Date of Birth/Sex: Treating RN: 1946-01-10 (76 y.o. Dean King Primary Care Kadence Mimbs: Dewaine Oats Other Clinician: Referring Serafin Decatur: Treating Vani Gunner/Extender: Dean Bible King in Treatment: 3 Active Problems Location of Pain Severity and Description of Pain Patient Has Paino No Site Locations Pain Management and  Medication Current Pain Management: Electronic Signature(s) Signed: 03/07/2023 12:18:53 PM By: Angelina Pih Entered By: Angelina Pih on 03/07/2023 03:47:42 Dean King ([id number]) [id number] Page 7 of 9 -------------------------------------------------------------------------------- Patient/Caregiver Education Details Patient Name: Date of Service: Dean King, Dean King 12/13/2024andnbsp9:15 A M Medical Record Number: [id number] Patient Account Number: [id number] Date of Birth/Gender: Treating RN: 1945-11-28 (77 y.o. Dean King Primary Care Physician: Dewaine Oats Other Clinician: Referring Physician: Treating Physician/Extender: Dean Bible King in Treatment: 3 Education Assessment Education Provided To: Patient Education Topics Provided Wound/Skin Impairment: Handouts: Caring for Your Ulcer Methods: Explain/Verbal Responses: State content correctly Electronic Signature(s) Signed: 03/07/2023 12:18:53 PM By: Angelina Pih Entered By: Angelina Pih on 03/07/2023 07:10:47 -------------------------------------------------------------------------------- Wound Assessment Details Patient Name: Date of Service: Dean Croak King. 03/07/2023 9:15 A M Medical Record Number: [id number] Patient Account Number: [id number] Date of Birth/Sex: Treating RN: 09/17/1945 (77 y.o. 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 Wound Open Wounding Event: Surgical Injury Status: Date Acquired: 01/16/2023 Comorbid Arrhythmia, Congestive Heart Failure, Coronary Artery Disease, King Of Treatment: 3 History: Hypertension, Type II Diabetes, Osteomyelitis  Clustered Wound: No Pending Amputation  On Presentation Photos Dean King, Dean King ([id number]) [id number] Page 8 of 9 Wound Measurements Length: (cm) 2.2 Width: (cm) 2.4 Depth: (cm) 0.3 Area: (cm) 4.147 Volume: (cm) 1.244 % Reduction in Area: 24.6% % Reduction in Volume: 77.4% Epithelialization: None Tunneling: No Undermining: No Wound Description Classification: Grade 3 Exudate Amount: Medium Exudate Type: Serosanguineous Exudate Color: red, brown Foul Odor After Cleansing: No Slough/Fibrino Yes Wound Bed Granulation Amount: Small (1-33%) Exposed Structure Granulation Quality: Red, Pink Fat Layer (Subcutaneous Tissue) Exposed: Yes Necrotic Amount: Large (67-100%) Necrotic Quality: Adherent Slough Treatment Notes Wound #1 (Amputation Site - Toe) Wound Laterality: Right Cleanser [place]ram Ancillary Kit - 15 Day Supply Discharge Instruction: Use supplies as instructed; Kit contains: (15) Saline Bullets; (15) 3x3 Gauze; 15 pr Gloves Soap and Water Discharge Instruction: Gently cleanse wound with antibacterial soap, rinse and pat dry prior to dressing wounds Wound Cleanser Discharge Instruction: Wash your hands with soap and water. Remove old dressing, discard into plastic bag and place into trash. Cleanse the wound with Wound Cleanser prior to applying a clean dressing using gauze sponges, not tissues or cotton balls. Do not scrub or use excessive force. Pat dry using gauze sponges, not tissue or cotton balls. Peri-Wound Care Topical Primary Dressing IODOFLEX 0.9% Cadexomer Iodine Pad Discharge Instruction: Apply Iodoflex to wound bed only as directed. Secondary Dressing Zetuvit Plus 4x4 (in/in) Secured With Medipore T - 32M Medipore H Soft Cloth Surgical T ape ape, 2x2 (in/yd) Kerlix Roll Sterile or Non-Sterile 6-ply 4.5x4 (yd/yd) Discharge Instruction: Apply Kerlix as directed Compression Wrap Compression Stockings Add-Ons Electronic Signature(s) Signed: 03/07/2023 12:18:53 PM By:  Angelina Pih Entered By: Angelina Pih on 03/07/2023 06:32:27 Dean King ([id number]) [id number] Page 9 of 9 -------------------------------------------------------------------------------- Vitals Details Patient Name: Date of Service: Dean King, EVELYN 03/07/2023 9:15 A M Medical Record Number: [id number] Patient Account Number: [id number] Date of Birth/Sex: Treating RN: 1945/05/26 (77 y.o. Dean King Primary Care Brendalee Matthies: Dewaine Oats Other Clinician: Referring Nga Rabon: Treating Kyasia Steuck/Extender: Dean Bible King in Treatment: 3 Vital Signs Time Taken: 09:25 Temperature (F): 97.6 Height (in): 70 Pulse (bpm): 78 Weight (lbs): 167 Respiratory Rate (breaths/min): 18 Body Mass Index (BMI): 24 Blood Pressure (mmHg): 113/58 Reference Range: 80 - 120 mg / dl Electronic Signature(s) Signed: 03/07/2023 12:18:53 PM By: Angelina Pih Entered By: Angelina Pih on 03/07/2023 06:26:17

## 2023-03-07 NOTE — Progress Notes (Addendum)
Dean King ([id number]) [id number] Page 1 of 9 Visit Report for 03/07/2023 Chief Complaint Document Details Patient Name: Date of Service: Dean King 03/07/2023 9:15 A M Medical Record Number: [id number] Patient Account Number: [id number] Date of Birth/Sex: Treating RN: Dean 04, 1947 (77 y.o. 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 03/07/2023 9:15 A M Medical Record Number: [id number] Patient Account Number: [id number] Date of Birth/Sex: Treating RN: 19-Sep-1945 (77 y.o. Dean King Primary Care Provider: Dewaine King Other Clinician: Referring Provider: Treating Provider/Extender: Dennie Bible Weeks in Treatment: 3 Debridement Performed for Assessment: Wound #1 Right Amputation Site - Toe Performed By: Physician Dean Derry, King The following information was scribed by: Dean King The information was scribed for: Dean Derry Debridement Type: Debridement Severity of Tissue Pre Debridement: Fat layer exposed Level of Consciousness (Pre-procedure): Awake and Alert Pre-procedure Verification/Time Out Yes - 10:04 Taken: Pain Control: Lidocaine 4% T opical Solution Percent of Wound Bed Debrided: 100% T Area Debrided (cm): otal 4.14 Tissue and other material debrided: Viable, Non-Viable, Slough, Subcutaneous, Slough Level: Skin/Subcutaneous Tissue Debridement Description: Excisional Instrument: Curette Bleeding: Large Hemostasis  Achieved: Silver Nitrate Dean King ([id number]) [id number] Page 2 of 9 Response to Treatment: Procedure was tolerated well Level of Consciousness (Post- Awake and Alert procedure): Post Debridement Measurements of Total Wound Length: (cm) 2.2 Width: (cm) 2.4 Depth: (cm) 0.3 Volume: (cm) 1.244 Character of Wound/Ulcer Post Debridement: Stable Severity of Tissue Post Debridement: Fat layer exposed Post Procedure Diagnosis Same as Pre-procedure Notes 1 stick used Electronic Signature(s) Signed: 03/07/2023 12:18:53 PM By: Dean King Signed: 03/07/2023 12:55:12 PM By: Dean King Entered By: Dean King on 03/07/2023 07:13:20 -------------------------------------------------------------------------------- HPI Details Patient Name: Date of Service: Dean King. 03/07/2023 9:15 A M Medical Record Number: [id number] Patient Account Number: [id number] Date of Birth/Sex: Treating RN: 1945-07-07 (77 y.o. Dean King Primary Care Provider: Dewaine King Other Clinician: Referring Provider: Treating Provider/Extender: Dennie Bible Weeks in Treatment: 3 History of Present Illness HPI Description: Epic Notes: The right common femoral is widely patent as is the profunda femoris. The SFA and popliteal have been stented in the past and the stents are widely patent. The trifurcation is diseased with occlusion of the anterior tibial artery, at the very distalmost aspect of the tibioperoneal trunk there is a greater than 90% stenosis which obstructs flow to the peroneal and posterior tibial arteries. Then approximately 3 to 5 cm distal to this lesion there is a greater than 70% stenosis in the posterior tibial. Lastly, as described above in the distalmost aspect of the posterior tibial and the plantar artery there is a diffuse greater than 80% stenosis. Following angioplasty the right posterior tibial now is now widely patent  including the distal plantar branch and demonstrates in-line flow with less than 20% residual stenosis. It is much improved and looks quite nice. Angioplasty and stent placement of the right external iliac artery also yields an excellent result with less than 10% residual stenosis. Summary: Successful recanalization right lower extremity for limb salvage Admission to Wound Care Center: 02-10-2023 upon evaluation today patient appears to be doing somewhat poorly in regard to a  wound that occurred as a result of initially a callus this was back in around March. Subsequently the patient tells me that he following this time in March ended up with an open wound he was seeing Dr. Allena Katz and slowly this open wound got worse to the point that he actually ended up having to go for surgery this was a ray amputation and that actually occurred on January 16, 2023. The patient is also been revascularized to have that note above which was reviewed in epic. It does appear that he had 90% stenosis of the peroneal and posterior tibial arteries after intervention this was a less than 10% residual stenosis he did have balloon angioplasty and stent. The patient's most recent hemoglobin A1c was 7.8 on 01-14-2023. 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 He had septic arthritis as well. Subsequently this was an MRI that was prior to the amputation on 01-15-2023 amputation which was a ray Dean King, Dean King ([id number]) [id number] Page 3 of 9 amputation was undertaken the next day. 02-17-2023 upon evaluation today patient appears to be doing well currently in regard to his wound which all things considered is actually doing quite well. Fortunately I do not see any signs of active infection at this time systemically locally he did test positive for Pseudomonas unfortunately the only oral medication for this is Levaquin he is allergic to Levaquin. For that reason we are going to actually continue with the recommendation currently for the topical Iodoflex which I think has helped they have Iodosorb which was given to them by supply I think that is perfectly fine essentially is good to do the same thing. Will try to get this wound cleaned out so that we can get it to heal. 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 02-25-2023 upon evaluation today patient appears to be doing pretty well currently in regard to his foot ulcer. Dr. Joylene Draft from infectious disease was here to see him as well today. She wanted another culture she was not wanting to go off of the PCR culture that we had done which showed Pseudomonas. She stated that PCR cultures can show everything in the wound which I explained I agree with but at the same time there are concentrations on percents that are assigned to this so that is not just a blanket everything that is in the wound. This helps to determine what Dean actually be causing issues or not and again  the Pseudomonas corresponds as well with what was seen in the deep pathology culture as  well from when he was in the hospital. Nonetheless he has not been treated yet for anything related to the Pseudomonas due to the fact that he is allergic to Levaquin. 03-07-2023 upon evaluation today patient appears to be doing excellent. I do not see any signs of active infection at this time which is great news and in general I do believe that making good headway here towards closure which is also. In fact he is on the IV antibiotics at this point with Dr. Joylene Draft and that seems to really be doing quite well. Electronic Signature(s) Signed: 03/07/2023 12:52:32 PM By: Dean King Entered By: Dean Derry on 03/07/2023 09:52:32 -------------------------------------------------------------------------------- Physical Exam Details Patient Name: Date of Service: Dean King. 03/07/2023 9:15 A M Medical Record Number: [id number] Patient Account Number: [id number] Date of Birth/Sex: Treating RN: 24-Aug-1945 (77 y.o. Dean King Primary Care Provider: Dewaine King Other Clinician: Referring Provider: Treating Provider/Extender: Dennie Bible Weeks in Treatment: 3 Constitutional Well-nourished and well-hydrated in no acute distress. Respiratory normal breathing without difficulty. Psychiatric this patient is able to make decisions and demonstrates good insight into disease process. Alert and Oriented x 3. pleasant and cooperative. Notes Patient's wound currently did require sharp debridement I did perform debridement clearway necrotic debris tolerated that today without complication I was able to clean a lot more today than normal he seems to be doing quite well. Electronic Signature(s) Signed: 03/07/2023 12:53:04 PM By: Dean King Entered By: Dean Derry on 03/07/2023 09:53:03 Vickii Penna King ([id number]) [id number] Page 4 of  9 -------------------------------------------------------------------------------- Physician Orders Details Patient Name: Date of Service: TYERELL, [place] 03/07/2023 9:15 A M Medical Record Number: [id number] Patient Account Number: [id number] Date of Birth/Sex: Treating RN: 03/28/1945 (77 y.o. 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 Home Health Home Health Company: - [facility] for wound care. Dean utilize formulary equivalent dressing for wound treatment orders unless otherwise specified. Home Health Nurse Dean visit PRN to address patients wound care needs. Scheduled days for dressing changes to be completed; exception, patient has scheduled wound care visit that day. **Please direct any NON-WOUND related issues/requests for orders to patient's Primary Care Physician. **If current dressing causes regression in wound condition, Dean King/C ordered dressing product/s and apply Normal Saline Moist Dressing daily until next Wound Healing Center or Other MD appointment.  **Notify Wound Healing Center of regression in wound condition at [phone]. Bathing/ Shower/ Hygiene Dean shower with wound dressing protected with water repellent cover or cast protector. No tub bath. Anesthetic (Use 'Patient Medications' Section for Anesthetic Order Entry) Lidocaine applied to wound bed Edema Control - Orders / Instructions Elevate, Exercise Daily and A void Standing for Long Periods of Time. Elevate legs to the level of the heart and pump  ankles as often as possible Elevate leg(s) parallel to the floor when sitting. DO YOUR BEST to sleep in the bed at night. DO NOT sleep in your recliner. Long hours of sitting in a recliner leads to swelling of the legs and/or potential wounds on your backside. Off-Loading Open toe surgical shoe - front off loader Hyperbaric Oxygen Therapy Wound #1 Right Amputation Site - Toe Other - discussed with patient at his new pt appointment Wound Treatment Wound #1 - Amputation Site - Toe Wound Laterality: Right Cleanser: Byram Ancillary Kit - 15 Day Supply (Generic) 3 x Per Week/15 Days Discharge Instructions: Use supplies as instructed; Kit contains: (15) Saline Bullets; (15) 3x3 Gauze; 15 pr Gloves Cleanser: Soap and Water 3 x Per Week/15 Days Discharge Instructions: Gently cleanse wound with antibacterial soap, rinse and pat dry prior to dressing wounds Cleanser: Wound Cleanser 3 x Per Week/15 Days Discharge Instructions: Wash your hands with soap and water. Remove old dressing, discard into plastic bag and place into trash. Cleanse the wound with Wound Cleanser prior to applying a clean dressing using gauze sponges, not tissues or cotton balls. Do not scrub or use excessive force. Pat dry using gauze sponges, not tissue or cotton balls. Prim Dressing: IODOFLEX 0.9% Cadexomer Iodine Pad (Generic) 3 x Per Week/15 Days ary Discharge Instructions: Apply Iodoflex to wound bed only as directed. Secondary Dressing: Zetuvit Plus 4x4 (in/in)  (Generic) 3 x Per Week/15 Days Dean King, Dean King ([id number]) [id number] Page 5 of 9 Secured With: Medipore T - 4M Medipore H Soft Cloth Surgical T ape ape, 2x2 (in/yd) (Generic) 3 x Per Week/15 Days Secured With: [employer] or Non-Sterile 6-ply 4.5x4 (yd/yd) (Generic) 3 x Per Week/15 Days Discharge Instructions: Apply Kerlix as directed Electronic Signature(s) Signed: 03/07/2023 12:18:53 PM By: Dean King Signed: 03/07/2023 12:55:12 PM By: Dean King Entered By: Dean King on 03/07/2023 07:10:15 -------------------------------------------------------------------------------- Problem List Details Patient Name: Date of Service: Dean King. 03/07/2023 9:15 A M Medical Record Number: [id number] Patient Account Number: [id number] Date of Birth/Sex: Treating RN: [birth year] (77 y.o. Dean King Primary Care Provider: Dewaine King Other Clinician: Referring Provider: Treating Provider/Extender: Dennie Bible Weeks in Treatment: 3 Active Problems ICD-10 Encounter Code Description Active Date MDM Diagnosis M86.371 Chronic multifocal osteomyelitis, right ankle and foot 02/10/2023 No Yes T81.31XA Disruption of external operation (surgical) wound, not elsewhere classified, 02/10/2023 No Yes initial encounter E11.621 Type 2 diabetes mellitus with foot ulcer 02/10/2023 No Yes L97.512 Non-pressure chronic ulcer of other part of right foot with fat layer exposed 02/10/2023 No Yes I25.10 Atherosclerotic heart disease of native coronary artery without angina pectoris 02/10/2023 No Yes I50.42 Chronic combined systolic (congestive) and diastolic (congestive) heart failure 02/10/2023 No Yes I48.0 Paroxysmal atrial fibrillation 02/10/2023 No Yes Z79.01 Long term (current) use of anticoagulants 02/10/2023 No Yes Dean King, Dean King ([id number]) [id number] Page 6 of 9 Inactive Problems Resolved  Problems Electronic Signature(s) Signed: 03/07/2023 9:17:39 AM By: Dean King Entered By: Dean Derry on 03/07/2023 06:17:39 -------------------------------------------------------------------------------- Progress Note Details Patient Name: Date of Service: Dean King. 03/07/2023 9:15 A M Medical Record Number: [id number] Patient Account Number: [id number] Date of Birth/Sex: Treating RN: 06-18-1945 (77 y.o. 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 The SFA and popliteal have been stented in the past and the stents are widely patent. The trifurcation is diseased with occlusion of the anterior tibial artery, at the very distalmost aspect of the tibioperoneal trunk there is a greater than 90% stenosis which obstructs flow to the peroneal and posterior tibial arteries. Then approximately 3 to 5 cm distal to this lesion there is a greater than 70% stenosis in the posterior tibial. Lastly, as described above in the distalmost aspect of the posterior tibial and the plantar artery there is a diffuse greater than 80% stenosis. Following angioplasty the right posterior tibial now is now widely patent including the distal plantar branch and demonstrates in-line flow with less than 20% residual stenosis. It is much improved and looks quite nice. Angioplasty and stent placement of the right external iliac artery also yields an excellent result with less than 10% residual stenosis. Summary: Successful recanalization right lower extremity for limb salvage Admission to Wound Care Center: 02-10-2023 upon evaluation today patient appears to be doing  somewhat poorly in regard to a wound that occurred as a result of initially a callus this was back in around March. Subsequently the patient tells me that he following this time in March ended up with an open wound he was seeing Dr. Allena Katz and slowly this open wound got worse to the point that he actually ended up having to go for surgery this was a ray amputation and that actually occurred on January 16, 2023. The patient is also been revascularized to have that note above which was reviewed in epic. It does appear that he had 90% stenosis of the peroneal and posterior tibial arteries after intervention this was a less than 10% residual stenosis he did have balloon angioplasty and stent. The patient's most recent hemoglobin A1c was 7.8 on 01-14-2023. 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 He had septic arthritis as well. Subsequently this was an MRI that was prior to the amputation on 01-15-2023 amputation which was a ray amputation was undertaken the next day. 02-17-2023 upon evaluation today patient appears to be doing well currently in regard to his wound which all things considered is  actually doing quite well. Fortunately I do not see any signs of active infection at this time systemically locally he did test positive for Pseudomonas unfortunately the only oral medication for this is Levaquin he is allergic to Levaquin. For that reason we are going to actually continue with the recommendation currently for the topical Iodoflex which I think has helped they have Iodosorb which was given to them by supply I think that is perfectly fine essentially is good to do  the same thing. Will try to get this wound cleaned out so that we can get it to heal. 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 02-25-2023 upon evaluation today patient appears to be doing pretty well currently in regard to his foot ulcer. Dr. Joylene Draft from infectious disease was here to see him as well today. She wanted another culture she was not wanting to go off of the PCR culture that we had done which showed Pseudomonas. She stated that PCR cultures can show everything in the wound which I explained I agree with but at the same time there are concentrations on percents that are assigned to this so that is not just a blanket everything that is in the wound. This helps to determine what Dean actually be causing issues or not and again the SABIEN, MAKAREWICZ ([id number]) [id number] Page 7 of 9 Pseudomonas corresponds as well with what was seen in the deep pathology culture as well from when he was in the hospital. Nonetheless he has not been treated yet for anything related to the Pseudomonas due to the fact that he is allergic to Levaquin. 03-07-2023 upon evaluation today patient appears to be doing excellent. I do not see any signs of active infection at this time which is great news and in general I do believe that making good headway here  towards closure which is also. In fact he is on the IV antibiotics at this point with Dr. Joylene Draft and that seems to really be doing quite well. Objective Constitutional Well-nourished and well-hydrated in no acute distress. Vitals Time Taken: 9:25 AM, Height: 70 in, Weight: 167 lbs, BMI: 24, Temperature: 97.6 F, Pulse: 78 bpm, Respiratory Rate: 18 breaths/min, Blood Pressure: 113/58 mmHg. Respiratory normal breathing without difficulty. Psychiatric this patient is able to make decisions and demonstrates good insight into disease process. Alert and Oriented x 3. pleasant and cooperative. General Notes: Patient's wound currently did require sharp debridement I did perform debridement clearway necrotic debris tolerated that today without complication I was able to clean a lot more today than normal he seems to be doing quite well. Integumentary (Hair, Skin) Wound #1 status is Open. Original cause of wound was Surgical Injury. The date acquired was: 01/16/2023. The wound has been in treatment 3 weeks. The wound is located on the Right Amputation Site - T The wound measures 2.2cm length x 2.4cm width x 0.3cm depth; 4.147cm^2 area and 1.244cm^3 volume. oe. There is Fat Layer (Subcutaneous Tissue) exposed. There is no tunneling or undermining noted. There is a medium amount of serosanguineous drainage noted. There is small (1-33%) red, pink granulation within the wound bed. There is a large (67-100%) amount of necrotic tissue within the wound bed including Adherent Slough. Assessment Active Problems ICD-10 Chronic multifocal osteomyelitis, right ankle and foot Disruption of external operation (surgical) wound, not elsewhere classified, initial encounter Type 2 diabetes mellitus with foot ulcer Non-pressure chronic ulcer of other part of right foot with fat layer exposed Atherosclerotic heart disease of native coronary artery without angina pectoris Chronic combined systolic (congestive) and  diastolic (congestive) heart failure Paroxysmal atrial fibrillation Long term (current) use of anticoagulants Procedures Wound #1 Pre-procedure diagnosis of Wound #1 is a Diabetic Wound/Ulcer of the Lower Extremity located on the Right Amputation Site - T .Severity of Tissue Pre oe Debridement is: Fat layer exposed. There  was a Excisional Skin/Subcutaneous Tissue Debridement with a total area of 4.14 sq cm performed by Dean Derry, King. With the following instrument(s): Curette to remove Viable and Non-Viable tissue/material. Material removed includes Subcutaneous Tissue and Slough and after achieving pain control using Lidocaine 4% Topical Solution. No specimens were taken. A time out was conducted at 10:04, prior to the start of the procedure. A Large amount of bleeding was controlled with Silver Nitrate. The procedure was tolerated well. Post Debridement Measurements: 2.2cm length x 2.4cm width x 0.3cm depth; 1.244cm^3 volume. Character of Wound/Ulcer Post Debridement is stable. Severity of Tissue Post Debridement is: Fat layer exposed. Post procedure Diagnosis Wound #1: Same as Pre-Procedure General Notes: 1 stick used. Plan Follow-up Appointments: Return Appointment in 1 week. Home Health: Home Health Company: - [facility] for wound care. Dean utilize formulary equivalent dressing for wound treatment orders unless otherwise specified. Home Health Nurse Dean King, Dean King ([id number]) [id number] Page 8 of 9 Dean visit PRN to address patients wound care needs. Scheduled days for dressing changes to be completed; exception, patient has scheduled wound care visit that day. **Please direct any NON-WOUND related issues/requests for orders to patient's Primary Care Physician. **If current dressing causes regression in wound condition, Dean King/C ordered dressing product/s and apply Normal Saline Moist Dressing daily until next Wound Healing Center or Other  MD appointment. **Notify Wound Healing Center of regression in wound condition at [phone]. Bathing/ Shower/ Hygiene: Dean shower with wound dressing protected with water repellent cover or cast protector. No tub bath. Anesthetic (Use 'Patient Medications' Section for Anesthetic Order Entry): Lidocaine applied to wound bed Edema Control - Orders / Instructions: Elevate, Exercise Daily and Avoid Standing for Long Periods of Time. Elevate legs to the level of the heart and pump ankles as often as possible Elevate leg(s) parallel to the floor when sitting. DO YOUR BEST to sleep in the bed at night. DO NOT sleep in your recliner. Long hours of sitting in a recliner leads to swelling of the legs and/or potential wounds on your backside. Off-Loading: Open toe surgical shoe - front off loader Hyperbaric Oxygen Therapy: Wound #1 Right Amputation Site - T oe: Other - discussed with patient at his new pt appointment WOUND #1: - Amputation Site - T oe Wound Laterality: Right Cleanser: Byram Ancillary Kit - 15 Day Supply (Generic) 3 x Per Week/15 Days Discharge Instructions: Use supplies as instructed; Kit contains: (15) Saline Bullets; (15) 3x3 Gauze; 15 pr Gloves Cleanser: Soap and Water 3 x Per Week/15 Days Discharge Instructions: Gently cleanse wound with antibacterial soap, rinse and pat dry prior to dressing wounds Cleanser: Wound Cleanser 3 x Per Week/15 Days Discharge Instructions: Wash your hands with soap and water. Remove old dressing, discard into plastic bag and place into trash. Cleanse the wound with Wound Cleanser prior to applying a clean dressing using gauze sponges, not tissues or cotton balls. Do not scrub or use excessive force. Pat dry using gauze sponges, not tissue or cotton balls. Prim Dressing: IODOFLEX 0.9% Cadexomer Iodine Pad (Generic) 3 x Per Week/15 Days ary Discharge Instructions: Apply Iodoflex to wound bed only as directed. Secondary Dressing: Zetuvit Plus 4x4  (in/in) (Generic) 3 x Per Week/15 Days Secured With: Medipore T - 25M Medipore H Soft Cloth Surgical T ape ape, 2x2 (in/yd) (Generic) 3 x Per Week/15 Days Secured With: [employer] Sterile or Non-Sterile 6-ply 4.5x4 (yd/yd) (Generic) 3 x Per Week/15 Days Discharge Instructions: Apply Kerlix as directed 1. I  would recommend based on what we are seeing that we have the patient going to continue to monitor for any signs of infection or worsening. Based on what I see I do believe there were making good headway here towards getting this closed. 2. I am also can recommend at this time that we have the patient continue with the Iodoflex. 3. I am hoping by next week we Dean be able to switch over to something like Hydrofera Blue we will see how things go. I am very pleased though with how we are progressing at this point. We will see patient back for reevaluation in 1 week here in the clinic. If anything worsens or changes patient will contact our office for additional recommendations. Electronic Signature(s) Signed: 03/07/2023 12:53:35 PM By: Dean King Entered By: Dean Derry on 03/07/2023 09:53:35 -------------------------------------------------------------------------------- SuperBill Details Patient Name: Date of Service: Dean King. 03/07/2023 Medical Record Number: [id number] Patient Account Number: [id number] Date of Birth/Sex: Treating RN: Oct 18, 1945 (77 y.o. Dean King Primary Care Provider: Dewaine King Other Clinician: Referring Provider: Treating Provider/Extender: Dennie Bible Weeks in Treatment: 3 Diagnosis Coding ICD-10 Codes Code Description [phone] Chronic multifocal osteomyelitis, right ankle and foot T81.31XA Disruption of external operation (surgical) wound, not elsewhere classified, initial encounter OTIE, KALLENBACH ([id number]) [id number] Page 9 of 9 E11.621 Type 2 diabetes mellitus with foot ulcer L97.512  Non-pressure chronic ulcer of other part of right foot with fat layer exposed I25.10 Atherosclerotic heart disease of native coronary artery without angina pectoris I50.42 Chronic combined systolic (congestive) and diastolic (congestive) heart failure I48.0 Paroxysmal atrial fibrillation Z79.01 Long term (current) use of anticoagulants Facility Procedures : CPT4 Code: [id number] Description: 11042 - DEB SUBQ TISSUE 20 SQ CM/< ICD-10 Diagnosis Description L97.512 Non-pressure chronic ulcer of other part of right foot with fat layer exposed Modifier: Quantity: 1 Physician Procedures : CPT4 Code Description Modifier 11042 11042 - WC PHYS SUBQ TISS 20 SQ CM ICD-10 Diagnosis Description L97.512 Non-pressure chronic ulcer of other part of right foot with fat layer exposed Quantity: 1 Electronic Signature(s) Signed: 03/07/2023 12:54:36 PM By: Dean King Entered By: Dean Derry on 03/07/2023 09:54:36

## 2023-03-10 LAB — VAS US ABI WITH/WO TBI
Left ABI: 0.75
Right ABI: 1.08

## 2023-03-10 NOTE — Progress Notes (Signed)
Subjective:    Patient ID: Dean King, male    DOB: September 10, 1945, 77 y.o.   MRN: [id number] Chief Complaint  Patient presents with   Follow-up    3 month follow up with ABI/LEA    Patient is a 77 year old male who returns today for noninvasive studies.  He recently underwent intervention on January 17, 2023.  Since that time he has been healing his wound fairly well.  His largest issues have been with infection more so than delayed wound healing.  He denies claudication-like symptoms or any development of any new open wounds or ulcerations.  Today's noninvasive studies show an ABI 1.08 on the right 0.75 on the left.  He has a TBI 0.73 on the right and 0.56 on the left.  Additional right lower extremity arterial duplex shows primarily biphasic waveforms throughout the lower extremity.    Review of Systems  Skin:  Positive for wound.  All other systems reviewed and are negative.      Objective:   Physical Exam Vitals reviewed.  HENT:     Head: Normocephalic.  Cardiovascular:     Rate and Rhythm: Normal rate.     Pulses:          Dorsalis pedis pulses are detected w/ Doppler on the right side and detected w/ Doppler on the left side.       Posterior tibial pulses are detected w/ Doppler on the right side and detected w/ Doppler on the left side.  Pulmonary:     Effort: Pulmonary effort is normal.  Skin:    General: Skin is warm and dry.  Neurological:     Mental Status: He is alert and oriented to person, place, and time.  Psychiatric:        Mood and Affect: Mood normal.        Thought Content: Thought content normal.        Judgment: Judgment normal.     BP 97/63 (BP Location: Left Arm)   Pulse 77   Resp 18   Ht 5\' 10"  (1.778 m)   Wt 181 lb 9.6 oz (82.4 kg)   BMI 26.06 kg/m   Past Medical History:  Diagnosis Date   AICD (automatic cardioverter/defibrillator) present    Diabetes mellitus without complication (HCC)    Hypertension    Myocardial infarction [facility])     x2 2005, 2006    Social History   Socioeconomic History   Marital status: Married    Spouse name: Not on file   Number of children: Not on file   Years of education: Not on file   Highest education level: Not on file  Occupational History   Not on file  Tobacco Use   Smoking status: Some Days    Types: Cigars   Smokeless tobacco: Never   Tobacco comments:    Occasional cigar when I play golf. 01/02/23 DJM  Vaping Use   Vaping status: Never Used  Substance and Sexual Activity   Alcohol use: Not Currently   Drug use: Never   Sexual activity: Not on file  Other Topics Concern   Not on file  Social History Narrative   Not on file   Social Drivers of Health   Financial Resource Strain: Low Risk  (07/05/2022)   Received from [facility], [facility]   Overall Financial Resource Strain (CARDIA)    Difficulty of Paying Living Expenses: Not hard at all  Food Insecurity: No Food Insecurity (01/14/2023)  Hunger Vital Sign    Worried About Running Out of Food in the Last Year: Never true    Ran Out of Food in the Last Year: Never true  Transportation Needs: No Transportation Needs (01/14/2023)   PRAPARE - Administrator, Civil Service (Medical): No    Lack of Transportation (Non-Medical): No  Physical Activity: Not on file  Stress: Not on file  Social Connections: Not on file  Intimate Partner Violence: Not At Risk (01/14/2023)   Humiliation, Afraid, Rape, and Kick questionnaire    Fear of Current or Ex-Partner: No    Emotionally Abused: No    Physically Abused: No    Sexually Abused: No    Past Surgical History:  Procedure Laterality Date   ABLATION  2023   AMPUTATION Right 01/16/2023   Procedure: PARTIAL 5TH RAY;  Surgeon: Candelaria Stagers, DPM;  Location: [facility] ORS;  Service: Orthopedics/Podiatry;  Laterality: Right;   CARDIAC CATHETERIZATION     CAROTID PTA/STENT INTERVENTION Left 04/05/2021   Procedure: CAROTID PTA/STENT INTERVENTION;   Surgeon: Annice Needy, MD;  Location: [facility] INVASIVE CV LAB;  Service: Cardiovascular;  Laterality: Left;   CHOLECYSTECTOMY     CORONARY ANGIOPLASTY     liver transplannt     LOWER EXTREMITY ANGIOGRAPHY Right 06/20/2022   Procedure: Lower Extremity Angiography;  Surgeon: Annice Needy, MD;  Location: [facility] INVASIVE CV LAB;  Service: Cardiovascular;  Laterality: Right;   LOWER EXTREMITY ANGIOGRAPHY Right 07/25/2022   Procedure: Lower Extremity Angiography;  Surgeon: Annice Needy, MD;  Location: [facility] INVASIVE CV LAB;  Service: Cardiovascular;  Laterality: Right;   LOWER EXTREMITY ANGIOGRAPHY Right 01/17/2023   Procedure: Lower Extremity Angiography;  Surgeon: Renford Dills, MD;  Location: [facility] INVASIVE CV LAB;  Service: Cardiovascular;  Laterality: Right;   LOWER EXTREMITY INTERVENTION Right 07/26/2022   Procedure: LOWER EXTREMITY INTERVENTION;  Surgeon: Annice Needy, MD;  Location: [facility] INVASIVE CV LAB;  Service: Cardiovascular;  Laterality: Right;    Family History  Family history unknown: Yes    Allergies  Allergen Reactions   Levaquin [Levofloxacin] Anaphylaxis       Latest Ref Rng & Units 02/27/2023    2:30 PM 01/18/2023    5:03 AM 01/17/2023    5:43 AM  CBC  WBC 4.0 - 10.5 K/uL 11.1  8.7  9.7   Hemoglobin 13.0 - 17.0 g/dL 09.8  9.7  11.9   Hematocrit 39.0 - 52.0 % 30.9  28.5  30.5   Platelets 150 - 400 K/uL 311  242  258       CMP     Component Value Date/Time   NA 137 02/27/2023 1430   K 4.3 02/27/2023 1430   CL 99 02/27/2023 1430   CO2 28 02/27/2023 1430   GLUCOSE 138 (H) 02/27/2023 1430   BUN 27 (H) 02/27/2023 1430   CREATININE 1.53 (H) 02/27/2023 1430   CALCIUM 9.3 02/27/2023 1430   PROT 7.1 02/27/2023 1430   ALBUMIN 3.3 (L) 02/27/2023 1430   AST 18 02/27/2023 1430   ALT 12 02/27/2023 1430   ALKPHOS 64 02/27/2023 1430   BILITOT 0.5 02/27/2023 1430   GFRNONAA 47 (L) 02/27/2023 1430     No results found.     Assessment & Plan:   1. Peripheral arterial  disease with history of revascularization (HCC) (Primary) The patient's noninvasive studies indicate that he should continue to have adequate ability for wound healing in the right lower extremity.  His left  lower extremity has noticed some decrease in the ABI but he is not having current symptoms and given the fact that he has been having significant issues with healing of the right feel would be prudent to observe his left or left significant to be any issues.  Patient will continue to follow with wound care for treatment of wound  2. Carotid stenosis, symptomatic, with infarction [facility]) Based on his previous studies that should be followed annually.  This should be done at his next follow-up.  3. Essential hypertension Continue antihypertensive medications as already ordered, these medications have been reviewed and there are no changes at this time.   Current Outpatient Medications on File Prior to Visit  Medication Sig Dispense Refill   aspirin EC 81 MG tablet Take 1 tablet by mouth daily.     atorvastatin (LIPITOR) 40 MG tablet Take 1 tablet (40 mg total) by mouth daily. 30 tablet 2   Calcium Carbonate-Vit D-Min (CALCIUM 600+D3 PLUS MINERALS) 600-800 MG-UNIT TABS Take 1 tablet by mouth 3 (three) times daily.     clopidogrel (PLAVIX) 75 MG tablet TAKE 1 TABLET BY MOUTH EVERY DAY 90 tablet 1   dapagliflozin propanediol (FARXIGA) 10 MG TABS tablet Take by mouth daily.     ELIQUIS 5 MG TABS tablet Take 1 tablet (5 mg total) by mouth 2 (two) times daily. 60 tablet 5   ENTRESTO 24-26 MG Take 0.5 tablets by mouth 2 (two) times daily.     furosemide (LASIX) 40 MG tablet Take 80 mg by mouth daily.     gabapentin (NEURONTIN) 100 MG capsule Take 1 capsule (100 mg total) by mouth 3 (three) times daily. 90 capsule 3   glipiZIDE (GLUCOTROL) 5 MG tablet Take 5 mg by mouth 2 (two) times daily before a meal.     magnesium oxide (MAG-OX) 400 MG tablet Take 400 mg by mouth 2 (two) times daily.     metFORMIN  (GLUCOPHAGE) 500 MG tablet Take 1,000 mg by mouth 2 (two) times daily.     metoprolol succinate (TOPROL-XL) 100 MG 24 hr tablet Take 150 mg by mouth daily. Take 1 tablet (100mg ) by mouth daily and 0.5 tablet (50mg ) nightly     nitroGLYCERIN (NITROSTAT) 0.4 MG SL tablet Place 0.4 mg under the tongue as needed.     PROGRAF 0.5 MG capsule Take 0.5 mg by mouth 2 (two) times daily.     spironolactone (ALDACTONE) 25 MG tablet Take 12.5 mg by mouth daily.     gabapentin (NEURONTIN) 100 MG capsule Take 1 capsule (100 mg total) by mouth 3 (three) times daily. 90 capsule 0   No current facility-administered medications on file prior to visit.    There are no Patient Instructions on file for this visit. No follow-ups on file.   Georgiana Spinner, NP

## 2023-03-11 ENCOUNTER — Encounter: Payer: Self-pay | Admitting: Infectious Diseases

## 2023-03-11 ENCOUNTER — Ambulatory Visit: Payer: Medicare Other | Attending: Infectious Diseases | Admitting: Infectious Diseases

## 2023-03-11 VITALS — BP 111/72 | HR 84 | Temp 98.0°F

## 2023-03-11 DIAGNOSIS — I130 Hypertensive heart and chronic kidney disease with heart failure and stage 1 through stage 4 chronic kidney disease, or unspecified chronic kidney disease: Secondary | ICD-10-CM | POA: Diagnosis present

## 2023-03-11 DIAGNOSIS — I2510 Atherosclerotic heart disease of native coronary artery without angina pectoris: Secondary | ICD-10-CM | POA: Insufficient documentation

## 2023-03-11 DIAGNOSIS — Z79899 Other long term (current) drug therapy: Secondary | ICD-10-CM | POA: Diagnosis not present

## 2023-03-11 DIAGNOSIS — Z944 Liver transplant status: Secondary | ICD-10-CM

## 2023-03-11 DIAGNOSIS — Z79621 Long term (current) use of calcineurin inhibitor: Secondary | ICD-10-CM | POA: Insufficient documentation

## 2023-03-11 DIAGNOSIS — L089 Local infection of the skin and subcutaneous tissue, unspecified: Secondary | ICD-10-CM

## 2023-03-11 DIAGNOSIS — E11628 Type 2 diabetes mellitus with other skin complications: Secondary | ICD-10-CM | POA: Diagnosis not present

## 2023-03-11 DIAGNOSIS — A498 Other bacterial infections of unspecified site: Secondary | ICD-10-CM | POA: Diagnosis not present

## 2023-03-11 DIAGNOSIS — Z85828 Personal history of other malignant neoplasm of skin: Secondary | ICD-10-CM | POA: Insufficient documentation

## 2023-03-11 DIAGNOSIS — I429 Cardiomyopathy, unspecified: Secondary | ICD-10-CM | POA: Diagnosis not present

## 2023-03-11 DIAGNOSIS — Z7901 Long term (current) use of anticoagulants: Secondary | ICD-10-CM | POA: Insufficient documentation

## 2023-03-11 DIAGNOSIS — E1122 Type 2 diabetes mellitus with diabetic chronic kidney disease: Secondary | ICD-10-CM

## 2023-03-11 DIAGNOSIS — I509 Heart failure, unspecified: Secondary | ICD-10-CM | POA: Insufficient documentation

## 2023-03-11 DIAGNOSIS — Z95810 Presence of automatic (implantable) cardiac defibrillator: Secondary | ICD-10-CM | POA: Insufficient documentation

## 2023-03-11 DIAGNOSIS — Z7984 Long term (current) use of oral hypoglycemic drugs: Secondary | ICD-10-CM

## 2023-03-11 DIAGNOSIS — N179 Acute kidney failure, unspecified: Secondary | ICD-10-CM

## 2023-03-11 DIAGNOSIS — N189 Chronic kidney disease, unspecified: Secondary | ICD-10-CM | POA: Diagnosis not present

## 2023-03-11 NOTE — Progress Notes (Unsigned)
NAME: Dean King  DOB: [birth year]  MRN: [id number]  Date/Time: 03/11/2023 11:41 AM   Subjective:  Pt referred to me by wound clinic ? EDYN NORDELL is a 77 y.o. 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 Culture later came positive for pseudomonas He was discharged before that on doxycycline The wound has not healed and discharging pus- He was sent to wound clinic by podiatrist. As he is allergic to quinolone he is referred to me I saw him in the wound clinic today with his wife Pt has no fever or chills He has not been treated appropriately for the wound yet  Past Medical History:  Diagnosis Date   AICD (automatic cardioverter/defibrillator) present    Diabetes mellitus without complication (HCC)    Hypertension    Myocardial infarction [facility])    x2 2005, 2006    Past Surgical History:  Procedure Laterality Date   ABLATION  2023   AMPUTATION Right 01/16/2023   Procedure: PARTIAL 5TH RAY;  Surgeon: Candelaria Stagers, DPM;  Location: [facility] ORS;  Service: Orthopedics/Podiatry;  Laterality: Right;   CARDIAC CATHETERIZATION     CAROTID PTA/STENT INTERVENTION Left 04/05/2021   Procedure: CAROTID PTA/STENT INTERVENTION;  Surgeon: Annice Needy, MD;  Location: [facility] INVASIVE CV LAB;  Service: Cardiovascular;  Laterality: Left;   CHOLECYSTECTOMY     CORONARY ANGIOPLASTY     liver transplannt     LOWER EXTREMITY ANGIOGRAPHY Right 06/20/2022   Procedure: Lower Extremity Angiography;  Surgeon: Annice Needy, MD;  Location: [facility] INVAS[facility] CV LAB;  Service: Cardiovascular;  Laterality: Right;   LOWER EXTREMITY ANGIOGRAPHY Right 07/25/2022   Procedure: Lower Extremity Angiography;  Surgeon: Annice Needy, MD;  Location: [facility] INVASIVE CV LAB;  Service: Cardiovascular;  Laterality: Right;   LOWER EXTREMITY  ANGIOGRAPHY Right 01/17/2023   Procedure: Lower Extremity Angiography;  Surgeon: Renford Dills, MD;  Location: [facility] INVASIVE CV LAB;  Service: Cardiovascular;  Laterality: Right;   LOWER EXTREMITY INTERVENTION Right 07/26/2022   Procedure: LOWER EXTREMITY INTERVENTION;  Surgeon: Annice Needy, MD;  Location: [facility] INVAS[facility] CV LAB;  Service: Cardiovascular;  Laterality: Right;    Social History   Socioeconomic History   Marital status: Married    Spouse name: Not on file   Number of children: Not on file   Years of education: Not on file   Highest education level: Not on file  Occupational History   Not on file  Tobacco Use   Smoking status: Some Days    Types: Cigars   Smokeless tobacco: Never   Tobacco comments:    Occasional cigar when I play golf. 01/02/23 DJM  Vaping Use   Vaping status: Never Used  Substance and Sexual Activity   Alcohol use: Not Currently   Drug use: Never   Sexual activity: Not on file  Other Topics Concern   Not on file  Social History Narrative   Not on file   Social Drivers of Health   Financial Resource Strain: Low Risk  (07/05/2022)   Received from [facility], [facility]   Overall Financial Resource Strain (CARDIA)    Difficulty of Paying Living Expenses: Not hard at all  Food Insecurity: No Food Insecurity (01/14/2023)   Hunger Vital Sign  Worried About Programme researcher, broadcasting/film/video in the Last Year: Never true    Ran Out of Food in the Last Year: Never true  Transportation Needs: No Transportation Needs (01/14/2023)   PRAPARE - Administrator, Civil Service (Medical): No    Lack of Transportation (Non-Medical): No  Physical Activity: Not on file  Stress: Not on file  Social Connections: Not on file  Intimate Partner Violence: Not At Risk (01/14/2023)   Humiliation, Afraid, Rape, and Kick questionnaire    Fear of Current or Ex-Partner: No    Emotionally Abused: No    Physically Abused: No    Sexually Abused: No     Family History  Family history unknown: Yes   Allergies  Allergen Reactions   Levaquin [Levofloxacin] Anaphylaxis   I? Current Outpatient Medications  Medication Sig Dispense Refill   aspirin EC 81 MG tablet Take 1 tablet by mouth daily.     atorvastatin (LIPITOR) 40 MG tablet Take 1 tablet (40 mg total) by mouth daily. 30 tablet 2   Calcium Carbonate-Vit D-Min (CALCIUM 600+D3 PLUS MINERALS) 600-800 MG-UNIT TABS Take 1 tablet by mouth 3 (three) times daily.     clopidogrel (PLAVIX) 75 MG tablet TAKE 1 TABLET BY MOUTH EVERY DAY 90 tablet 1   dapagliflozin propanediol (FARXIGA) 10 MG TABS tablet Take by mouth daily.     ELIQUIS 5 MG TABS tablet Take 1 tablet (5 mg total) by mouth 2 (two) times daily. 60 tablet 5   ENTRESTO 24-26 MG Take 0.5 tablets by mouth 2 (two) times daily.     furosemide (LASIX) 40 MG tablet Take 80 mg by mouth daily.     gabapentin (NEURONTIN) 100 MG capsule Take 1 capsule (100 mg total) by mouth 3 (three) times daily. 90 capsule 3   glipiZIDE (GLUCOTROL) 5 MG tablet Take 5 mg by mouth 2 (two) times daily before a meal.     magnesium oxide (MAG-OX) 400 MG tablet Take 400 mg by mouth 2 (two) times daily.     metFORMIN (GLUCOPHAGE) 500 MG tablet Take 1,000 mg by mouth 2 (two) times daily.     metoprolol succinate (TOPROL-XL) 100 MG 24 hr tablet Take 150 mg by mouth daily. Take 1 tablet (100mg ) by mouth daily and 0.5 tablet (50mg ) nightly     nitroGLYCERIN (NITROSTAT) 0.4 MG SL tablet Place 0.4 mg under the tongue as needed.     PROGRAF 0.5 MG capsule Take 0.5 mg by mouth 2 (two) times daily.     spironolactone (ALDACTONE) 25 MG tablet Take 12.5 mg by mouth daily.     gabapentin (NEURONTIN) 100 MG capsule Take 1 capsule (100 mg total) by mouth 3 (three) times daily. 90 capsule 0   No current facility-administered medications for this visit.     Abtx:  Anti-infectives (From admission, onward)    None       REVIEW OF SYSTEMS:  Const: negative fever, negative  chills, negative weight loss Eyes: negative diplopia or visual changes, negative eye pain ENT: negative coryza, negative sore throat Resp: negative cough, hemoptysis, dyspnea Cards: negative for chest pain, palpitations, lower extremity edema GU: negative for frequency, dysuria and hematuria GI: Negative for abdominal pain, diarrhea, bleeding, constipation Skin: negative for rash and pruritus Heme: negative for easy bruising and gum/nose bleeding MS: negative for myalgias, arthralgias, back pain and muscle weakness Neurolo:negative for headaches, dizziness, vertigo, memory problems  Psych: negative for feelings of anxiety, depression  Endocrine: diabetes Allergy/Immunology-quinolone- levaquin  Objective:  VITALS:  BP 111/72   Pulse 84   Temp 98 F (36.7 C) (Oral)   SpO2 98%   PHYSICAL EXAM:  General: Alert, cooperative, no distress, appears stated age.  Head: Normocephalic, without obvious abnormality, atraumatic. Eyes: Conjunctivae clear, anicteric sclerae. Pupils are equal ENT Nares normal. No drainage or sinus tenderness. Lips, mucosa, and tongue normal. No Thrush Neck: Supple, symmetrical, no adenopathy, thyroid: non tender no carotid bruit and no JVD. Back: No CVA tenderness. Lungs: Clear to auscultation bilaterally. No Wheezing or Rhonchi. No rales. Heart: Regular rate and rhythm, no murmur, rub or gallop. Abdomen: did not examine Extremities: rt 5th toe amputation- site has purulent dischare and wound  Skin: No rashes or lesions. Or bruising Lymph: Cervical, supraclavicular normal. Neurologic: Grossly non-focal Pertinent Labs Lab Results CBC    Component Value Date/Time   WBC 11.1 (H) 02/27/2023 1430   RBC 3.65 (L) 02/27/2023 1430   HGB 10.0 (L) 02/27/2023 1430   HCT 30.9 (L) 02/27/2023 1430   PLT 311 02/27/2023 1430   MCV 84.7 02/27/2023 1430   MCH 27.4 02/27/2023 1430   MCHC 32.4 02/27/2023 1430   RDW 20.5 (H) 02/27/2023 1430   LYMPHSABS 1.7 02/27/2023  1430   MONOABS 0.6 02/27/2023 1430   EOSABS 0.3 02/27/2023 1430   BASOSABS 0.0 02/27/2023 1430       Latest Ref Rng & Units 02/27/2023    2:30 PM 01/18/2023    5:03 AM 01/17/2023    9:53 AM  CMP  Glucose 70 - 99 mg/dL 604     BUN 8 - 23 mg/dL 27     Creatinine 5.40 - 1.24 mg/dL 9.81  1.91  4.78   Sodium 135 - 145 mmol/L 137     Potassium 3.5 - 5.1 mmol/L 4.3     Chloride 98 - 111 mmol/L 99     CO2 22 - 32 mmol/L 28     Calcium 8.9 - 10.3 mg/dL 9.3     Total Protein 6.5 - 8.1 g/dL 7.1     Total Bilirubin <1.2 mg/dL 0.5     Alkaline Phos 38 - 126 U/L 64     AST 15 - 41 U/L 18     ALT 0 - 44 U/L 12         Microbiology: No results found for this or any previous visit (from the past 240 hours).  IMAGING RESULTS: No recent imaging 01/16/23- amputated 5th toe I have personally reviewed the films ? 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  Would discuss with Dr.Patel to repeat Xray  Dm on multiple meds including glipizide, farxiga, metformin CKD diuretics aggravating it  AICD Afib on eliquis Htn on entresto  Liver transplant on low dose prograf  ?Discussed the management with patient , his wife and wound care consultant Will contact patient once culture results and arrange for antibiotics by thursday ?Will follow up in my clinic  OPAT orders  Diagnosis: Pseudomonas foor infection' Diabetic foot infection  Baseline Creatinine 1.35     Allergies  Allergen Reactions   Levaquin [Levofloxacin] Anaphylaxis    OPAT Orders Discharge antibiotics: Cefepime 2 grams IV every 12 hours  Duration: 2 weeks - ( likely to extend to 4 weeks) End Date: 03/13/23 (  may extend 03/27/23)  PIC Care Per Protocol: Home Health  RN to be consulted for  Change dressing every week Placing  biopatch Drawing labs  Labs weekly while on IV antibiotics: _X_ CBC with differential  _X_ CMP __X CRP _X_ ESR    _X_ Please leave PIC in place until doctor has seen patient or been notified  Fax weekly lab results  promptly to [phone]  Clinic Follow Up Appt: 03/11/23   Call [phone] with any questions

## 2023-03-11 NOTE — Patient Instructions (Signed)
  During today's visit, we discussed your ongoing treatment for a right foot infection, your kidney function, calcium levels, and heart failure management. Your foot infection is showing some improvement, but your kidney function has worsened, and your calcium levels are high. YOUR PLAN:  -FOOT INFECTION: A foot infection is when bacteria invade and multiply in the tissues of the foot, causing pain, swelling, and sometimes oozing. We will extend your IV Cefepime treatment for another two weeks but reduce the dose to once daily due to your kidney function. Please continue to monitor your foot and report any changes. We will also check your blood weekly to monitor your kidney function and response to the antibiotics.  -WORSENING KIDNEY FUNCTION: Worsening kidney function means your kidneys are not filtering waste from your blood as well as they should. Your creatinine levels have increased, which is a sign of this. We will reduce your Cefepime dose to once daily and stop Metformin. I contacted your PCP and he asked to hold metformin and calcium- Sent a message to your  cardiologist to discuss further medication adjustments.  -HYPERCALCEMIA: Hypercalcemia is when there is too much calcium in your blood, which can cause various symptoms and affect your kidney function. We recommend holding calcium supplements and increasing your water intake. Please avoid diet drinks and juices.  -HEART FAILURE: Heart failure means your heart is not pumping blood as well as it should. You are on multiple medications for this condition. contact your cardiologist to discuss any potential adjustments due to your worsening kidney function.  -GENERAL HEALTH MAINTENANCE: To support your overall health, we recommend increasing your water intake and elevating your foot to reduce swelling.  INSTRUCTIONS:  Please follow up with your primary care physician and cardiologist to discuss medication adjustments. Continue with the weekly  blood tests to monitor your kidney function and response to antibiotics. Report any changes in your foot condition or any new symptoms immediately

## 2023-03-14 ENCOUNTER — Ambulatory Visit: Payer: Medicare Other | Admitting: Physician Assistant

## 2023-03-22 ENCOUNTER — Other Ambulatory Visit (INDEPENDENT_AMBULATORY_CARE_PROVIDER_SITE_OTHER): Payer: Self-pay | Admitting: Vascular Surgery

## 2023-03-26 DEATH — deceased

## 2023-03-31 ENCOUNTER — Encounter: Payer: Self-pay | Admitting: Infectious Diseases

## 2023-03-31 ENCOUNTER — Encounter (INDEPENDENT_AMBULATORY_CARE_PROVIDER_SITE_OTHER): Payer: Self-pay | Admitting: Vascular Surgery

## 2023-03-31 NOTE — Telephone Encounter (Signed)
 If we can update his status

## 2023-04-01 ENCOUNTER — Ambulatory Visit: Payer: Medicare Other | Admitting: Infectious Diseases

## 2023-04-22 ENCOUNTER — Encounter: Payer: Self-pay | Admitting: Physician Assistant

## 2023-06-10 ENCOUNTER — Ambulatory Visit (INDEPENDENT_AMBULATORY_CARE_PROVIDER_SITE_OTHER): Payer: Medicare Other | Admitting: Vascular Surgery

## 2023-06-10 ENCOUNTER — Encounter (INDEPENDENT_AMBULATORY_CARE_PROVIDER_SITE_OTHER): Payer: Medicare Other

## 2023-07-03 IMAGING — CT CT ANGIO HEAD-NECK (W OR W/O PERF)
2 of 7 series · 8 of 33 positions shown · IV contrast (omnipaque)
Comparison: No prior CTA. Correlation is made with 03/23/2021 CT
head.

CLINICAL DATA: Word finding difficulty

EXAM:
CT ANGIOGRAPHY HEAD AND NECK
TECHNIQUE: Multidetector CT imaging of the head and neck was performed using
the standard protocol during bolus administration of intravenous
contrast. Multiplanar CT image reconstructions and MIPs were
obtained to evaluate the vascular anatomy. Carotid stenosis
measurements (when applicable) are obtained utilizing NASCET
criteria, using the distal internal carotid diameter as the
denominator.
CONTRAST:  75mL OMNIPAQUE IOHEXOL 350 MG/ML SOLN

[Series 6: cta head neck · axial · 0.48mm/px · z∈[+110,+230]mm · 2 of 182 slices shown]
[im 61/182  soft-tissue]
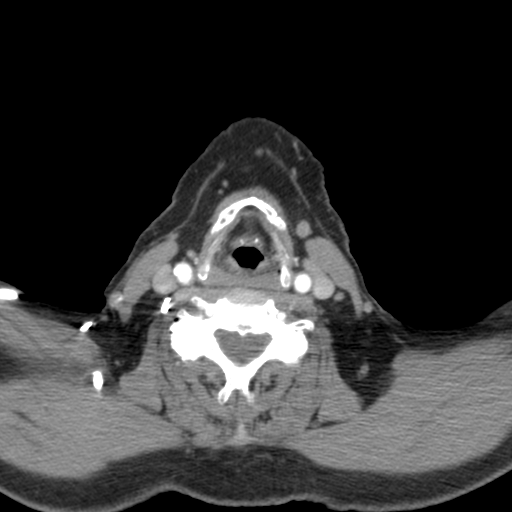
[im 121/182  soft-tissue]
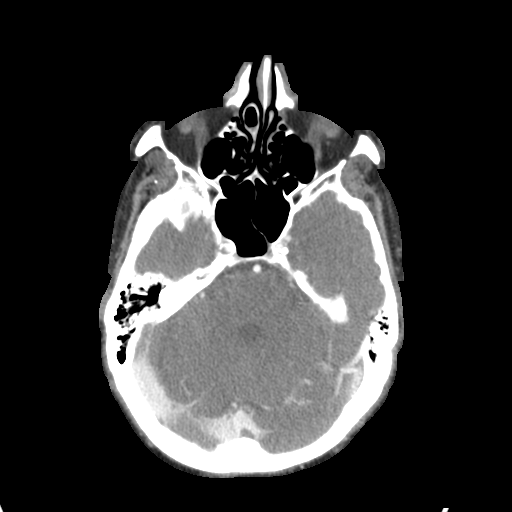

[Series 8: ax thin · axial · 0.41mm/px · z∈[+40,+297]mm · 6 of 361 slices shown]
[im 52/361  soft-tissue]
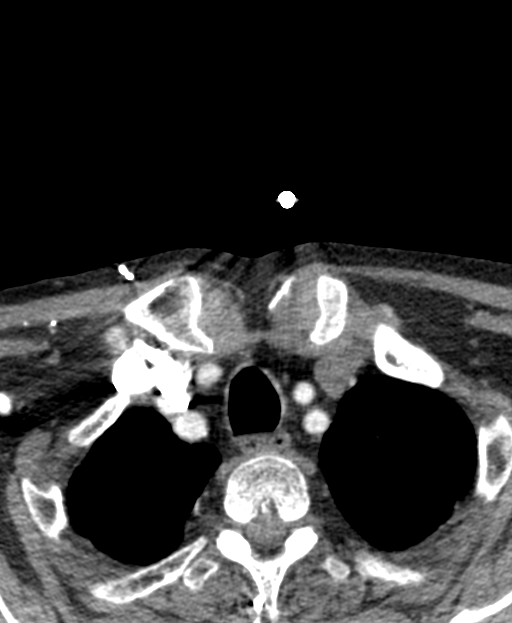
[im 103/361  bone]
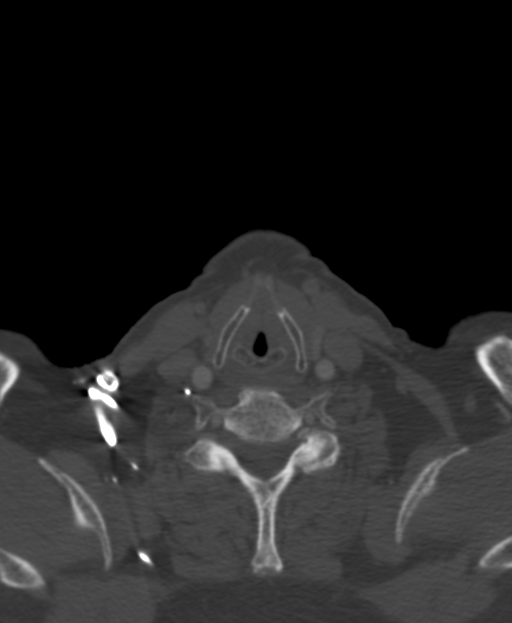
[im 155/361  soft-tissue]
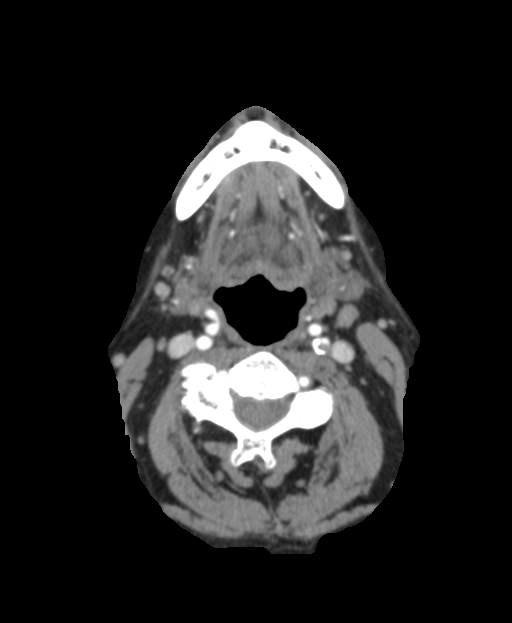
[im 206/361  bone]
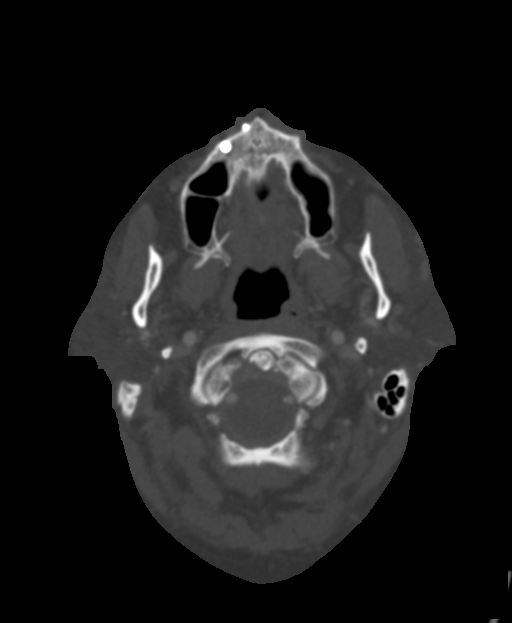
[im 258/361  soft-tissue]
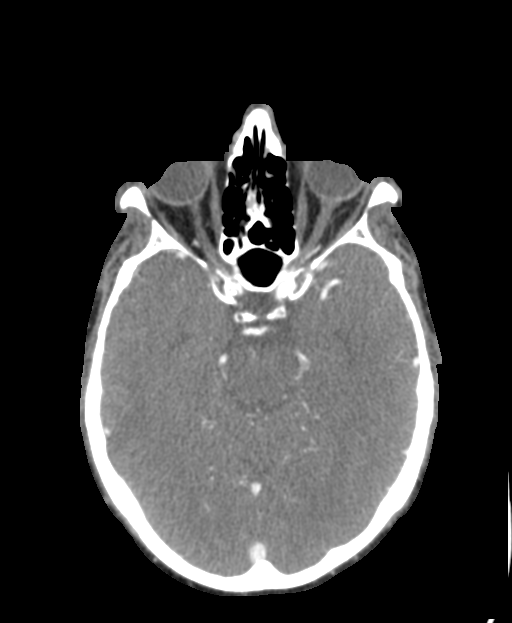
[im 309/361  bone]
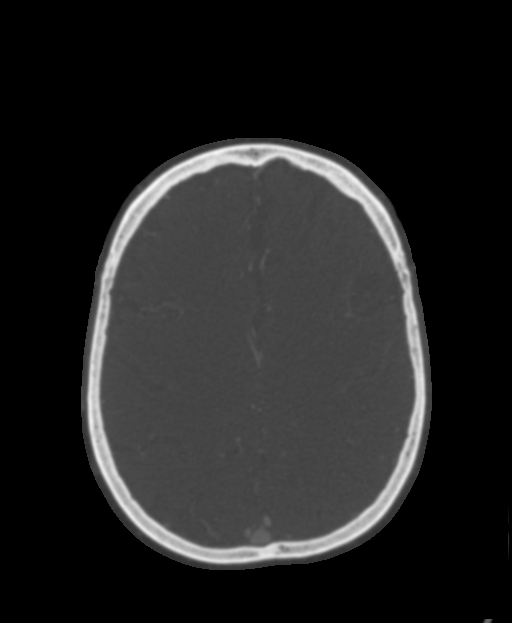

[8 of 33 positions shown; findings below may reference images not displayed]

FINDINGS: CT HEAD FINDINGS

For noncontrast findings, please see same day CT head.

CTA NECK FINDINGS

Aortic arch: Standard branching. Imaged portion shows no evidence of
aneurysm or dissection. No significant stenosis of the major arch
vessel origins, although calcified and noncalcified plaque does
extend into the origins.

Right carotid system: Calcified and noncalcified plaque at the
bifurcation, which causes less than 50% luminal narrowing. No
evidence of dissection, stenosis (50% or greater) or occlusion.

Left carotid system: Calcified and noncalcified plaque at the
bifurcation and in the proximal left ICA causes 70% luminal
narrowing in the proximal left ICA. In addition, there is
approximately 30% narrowing of the proximal left CCA. No evidence of
dissection or occlusion.

Vertebral arteries: Right dominant. No evidence of dissection,
stenosis (50% or greater) or occlusion.

Skeleton: No acute osseous abnormality.

Other neck: Prominent left parotid (series 8, image 162) and left
level 2A (series 8, image 209) lymph nodes, which each measure up to
6 mm in short axis. These are not of abnormal density or morphology
and are favored to be reactive.

Upper chest: Negative.

Review of the MIP images confirms the above findings

CTA HEAD FINDINGS

Anterior circulation:

Calcified and noncalcified plaque in the intracranial internal
carotid arteries, left-greater-than-right, which causes moderate
luminal narrowing in the left cavernous segment and moderate
narrowing in the right supraclinoid segment.

Patent right A1. Hypoplastic left A1. Normal anterior communicating
artery. Anterior cerebral arteries are patent to their distal
aspects.

No M1 stenosis or occlusion. Normal MCA bifurcations. Distal MCA
branches perfused and symmetric.

Posterior circulation:

Vertebral arteries patent to the vertebrobasilar junction with
minimal calcification but without significant stenosis. Right
dominant. Posterior inferior cerebral arteries patent bilaterally.

Basilar patent to its distal aspect. Superior cerebellar arteries
patent bilaterally.

Bilateral P1 segments originate from the basilar artery. Duplicated
left PCA with fetal origin from a patent left posterior
communicating artery. PCAs perfused to their distal aspects without
stenosis.

Venous sinuses: Patent.

Anatomic variants: Duplicated left PCA, with additional fetal origin
PCA

Review of the MIP images confirms the above findings
IMPRESSION: 1. Moderate narrowing of the bilateral intracranial ICAs. No other
significant intracranial stenosis.
2. 70% luminal narrowing in the proximal left ICA. In addition there
is approximately 30% narrowing of the proximal left CCA. No other
hemodynamically significant stenosis in the neck.

## 2023-08-12 ENCOUNTER — Encounter (INDEPENDENT_AMBULATORY_CARE_PROVIDER_SITE_OTHER): Payer: Self-pay

## 2023-08-15 ENCOUNTER — Encounter (INDEPENDENT_AMBULATORY_CARE_PROVIDER_SITE_OTHER): Payer: Medicare Other

## 2023-08-15 ENCOUNTER — Ambulatory Visit (INDEPENDENT_AMBULATORY_CARE_PROVIDER_SITE_OTHER): Payer: Medicare Other | Admitting: Vascular Surgery
